# Patient Record
Sex: Male | Born: 1965 | Race: White | Hispanic: No | Marital: Married | Smoking: Never smoker
Health system: Southern US, Community
[De-identification: ages and names within clinical notes are randomized; demographics above are authoritative.]

## PROBLEM LIST (undated history)

## (undated) ENCOUNTER — Emergency Department: Discharge status: Absent without leave | Payer: Self-pay

## (undated) DIAGNOSIS — G4733 Obstructive sleep apnea (adult) (pediatric): Secondary | ICD-10-CM

## (undated) DIAGNOSIS — T4145XA Adverse effect of unspecified anesthetic, initial encounter: Secondary | ICD-10-CM

## (undated) DIAGNOSIS — K219 Gastro-esophageal reflux disease without esophagitis: Secondary | ICD-10-CM

## (undated) DIAGNOSIS — E781 Pure hyperglyceridemia: Secondary | ICD-10-CM

## (undated) DIAGNOSIS — K5792 Diverticulitis of intestine, part unspecified, without perforation or abscess without bleeding: Secondary | ICD-10-CM

## (undated) DIAGNOSIS — Z87442 Personal history of urinary calculi: Secondary | ICD-10-CM

## (undated) DIAGNOSIS — IMO0001 Reserved for inherently not codable concepts without codable children: Secondary | ICD-10-CM

## (undated) DIAGNOSIS — Z8719 Personal history of other diseases of the digestive system: Secondary | ICD-10-CM

## (undated) DIAGNOSIS — K200 Eosinophilic esophagitis: Secondary | ICD-10-CM

## (undated) DIAGNOSIS — E119 Type 2 diabetes mellitus without complications: Secondary | ICD-10-CM

## (undated) DIAGNOSIS — E786 Lipoprotein deficiency: Secondary | ICD-10-CM

## (undated) DIAGNOSIS — M5030 Other cervical disc degeneration, unspecified cervical region: Secondary | ICD-10-CM

## (undated) DIAGNOSIS — T8859XA Other complications of anesthesia, initial encounter: Secondary | ICD-10-CM

## (undated) DIAGNOSIS — J189 Pneumonia, unspecified organism: Secondary | ICD-10-CM

## (undated) DIAGNOSIS — G43909 Migraine, unspecified, not intractable, without status migrainosus: Secondary | ICD-10-CM

## (undated) HISTORY — DX: Diverticulitis of intestine, part unspecified, without perforation or abscess without bleeding: K57.92

## (undated) HISTORY — DX: Migraine, unspecified, not intractable, without status migrainosus: G43.909

## (undated) HISTORY — DX: Pure hyperglyceridemia: E78.1

## (undated) HISTORY — PX: NASAL SINUS SURGERY: SHX719

## (undated) HISTORY — DX: Other cervical disc degeneration, unspecified cervical region: M50.30

## (undated) HISTORY — PX: TYMPANOSTOMY TUBE PLACEMENT: SHX32

## (undated) HISTORY — DX: Eosinophilic esophagitis: K20.0

## (undated) HISTORY — DX: Obstructive sleep apnea (adult) (pediatric): G47.33

## (undated) HISTORY — DX: Reserved for inherently not codable concepts without codable children: IMO0001

## (undated) HISTORY — DX: Lipoprotein deficiency: E78.6

## (undated) HISTORY — PX: KIDNEY STONE SURGERY: SHX686

## (undated) HISTORY — PX: TONSILLECTOMY AND ADENOIDECTOMY: SHX28

---

## 2004-11-03 ENCOUNTER — Ambulatory Visit: Payer: Self-pay | Admitting: Family Medicine

## 2004-11-28 ENCOUNTER — Ambulatory Visit: Payer: Self-pay | Admitting: Family Medicine

## 2004-12-25 ENCOUNTER — Ambulatory Visit: Payer: Self-pay | Admitting: Family Medicine

## 2005-01-15 ENCOUNTER — Ambulatory Visit: Payer: Self-pay | Admitting: Family Medicine

## 2005-02-21 ENCOUNTER — Ambulatory Visit: Payer: Self-pay | Admitting: Family Medicine

## 2005-04-04 ENCOUNTER — Ambulatory Visit: Payer: Self-pay | Admitting: Family Medicine

## 2005-05-17 ENCOUNTER — Ambulatory Visit: Payer: Self-pay | Admitting: Family Medicine

## 2005-08-16 ENCOUNTER — Ambulatory Visit: Payer: Self-pay | Admitting: Family Medicine

## 2005-08-23 ENCOUNTER — Ambulatory Visit: Payer: Self-pay | Admitting: Family Medicine

## 2005-11-20 DIAGNOSIS — N200 Calculus of kidney: Secondary | ICD-10-CM | POA: Insufficient documentation

## 2005-11-20 DIAGNOSIS — K2100 Gastro-esophageal reflux disease with esophagitis, without bleeding: Secondary | ICD-10-CM | POA: Insufficient documentation

## 2005-11-20 DIAGNOSIS — G43909 Migraine, unspecified, not intractable, without status migrainosus: Secondary | ICD-10-CM | POA: Insufficient documentation

## 2005-11-20 DIAGNOSIS — F411 Generalized anxiety disorder: Secondary | ICD-10-CM | POA: Insufficient documentation

## 2005-11-20 DIAGNOSIS — K209 Esophagitis, unspecified: Secondary | ICD-10-CM

## 2006-11-20 ENCOUNTER — Ambulatory Visit: Payer: Self-pay | Admitting: Family Medicine

## 2006-12-05 ENCOUNTER — Ambulatory Visit: Payer: Self-pay | Admitting: Family Medicine

## 2006-12-11 ENCOUNTER — Encounter: Admission: RE | Admit: 2006-12-11 | Discharge: 2006-12-11 | Payer: Self-pay | Admitting: Family Medicine

## 2006-12-11 ENCOUNTER — Ambulatory Visit: Payer: Self-pay | Admitting: Family Medicine

## 2006-12-12 ENCOUNTER — Encounter: Payer: Self-pay | Admitting: Family Medicine

## 2006-12-13 ENCOUNTER — Telehealth (INDEPENDENT_AMBULATORY_CARE_PROVIDER_SITE_OTHER): Payer: Self-pay | Admitting: *Deleted

## 2006-12-25 ENCOUNTER — Encounter: Payer: Self-pay | Admitting: Family Medicine

## 2007-01-24 ENCOUNTER — Encounter: Payer: Self-pay | Admitting: Family Medicine

## 2007-02-08 ENCOUNTER — Encounter: Payer: Self-pay | Admitting: Family Medicine

## 2007-03-03 ENCOUNTER — Encounter: Payer: Self-pay | Admitting: Family Medicine

## 2007-06-25 ENCOUNTER — Encounter: Payer: Self-pay | Admitting: Family Medicine

## 2007-06-25 DIAGNOSIS — K5730 Diverticulosis of large intestine without perforation or abscess without bleeding: Secondary | ICD-10-CM | POA: Insufficient documentation

## 2007-06-25 DIAGNOSIS — K760 Fatty (change of) liver, not elsewhere classified: Secondary | ICD-10-CM | POA: Insufficient documentation

## 2007-07-01 ENCOUNTER — Ambulatory Visit: Payer: Self-pay | Admitting: Family Medicine

## 2007-07-01 DIAGNOSIS — B070 Plantar wart: Secondary | ICD-10-CM | POA: Insufficient documentation

## 2007-07-02 ENCOUNTER — Encounter: Payer: Self-pay | Admitting: Family Medicine

## 2007-07-02 LAB — CONVERTED CEMR LAB
AST: 28 units/L (ref 0–37)
INR: 1 (ref 0.0–1.5)
Prothrombin Time: 13.5 s (ref 11.6–15.2)

## 2007-07-08 ENCOUNTER — Encounter: Payer: Self-pay | Admitting: Family Medicine

## 2007-07-22 ENCOUNTER — Encounter: Payer: Self-pay | Admitting: Family Medicine

## 2007-11-24 ENCOUNTER — Ambulatory Visit: Payer: Self-pay | Admitting: Family Medicine

## 2007-11-24 ENCOUNTER — Encounter: Admission: RE | Admit: 2007-11-24 | Discharge: 2007-11-24 | Payer: Self-pay | Admitting: Family Medicine

## 2007-11-24 DIAGNOSIS — M546 Pain in thoracic spine: Secondary | ICD-10-CM | POA: Insufficient documentation

## 2008-01-26 ENCOUNTER — Ambulatory Visit: Payer: Self-pay | Admitting: Family Medicine

## 2008-01-29 ENCOUNTER — Encounter: Payer: Self-pay | Admitting: Family Medicine

## 2008-02-04 ENCOUNTER — Encounter: Admission: RE | Admit: 2008-02-04 | Discharge: 2008-02-12 | Payer: Self-pay | Admitting: Sports Medicine

## 2008-02-14 ENCOUNTER — Ambulatory Visit: Payer: Self-pay | Admitting: Occupational Medicine

## 2008-02-14 DIAGNOSIS — J018 Other acute sinusitis: Secondary | ICD-10-CM | POA: Insufficient documentation

## 2008-08-18 ENCOUNTER — Ambulatory Visit: Payer: Self-pay | Admitting: Family Medicine

## 2008-08-18 DIAGNOSIS — H6980 Other specified disorders of Eustachian tube, unspecified ear: Secondary | ICD-10-CM | POA: Insufficient documentation

## 2008-08-18 DIAGNOSIS — H6690 Otitis media, unspecified, unspecified ear: Secondary | ICD-10-CM | POA: Insufficient documentation

## 2008-10-20 ENCOUNTER — Ambulatory Visit: Payer: Self-pay | Admitting: Family Medicine

## 2008-10-20 DIAGNOSIS — B852 Pediculosis, unspecified: Secondary | ICD-10-CM | POA: Insufficient documentation

## 2008-10-20 DIAGNOSIS — J019 Acute sinusitis, unspecified: Secondary | ICD-10-CM | POA: Insufficient documentation

## 2008-10-22 ENCOUNTER — Encounter: Payer: Self-pay | Admitting: Family Medicine

## 2008-10-22 LAB — CONVERTED CEMR LAB
Eosinophils Absolute: 0.3 10*3/uL (ref 0.0–0.7)
Eosinophils Relative: 3 % (ref 0–5)
Lymphocytes Relative: 19 % (ref 12–46)
MCHC: 33.3 g/dL (ref 30.0–36.0)
MCV: 84.2 fL (ref 78.0–100.0)
Monocytes Absolute: 0.8 10*3/uL (ref 0.1–1.0)
Monocytes Relative: 8 % (ref 3–12)
Platelets: 179 10*3/uL (ref 150–400)
RDW: 14.7 % (ref 11.5–15.5)

## 2009-03-24 ENCOUNTER — Encounter: Payer: Self-pay | Admitting: Family Medicine

## 2009-06-26 ENCOUNTER — Encounter: Payer: Self-pay | Admitting: Family Medicine

## 2009-11-16 ENCOUNTER — Ambulatory Visit: Payer: Self-pay | Admitting: Family Medicine

## 2009-11-16 DIAGNOSIS — G43119 Migraine with aura, intractable, without status migrainosus: Secondary | ICD-10-CM | POA: Insufficient documentation

## 2009-11-16 DIAGNOSIS — I10 Essential (primary) hypertension: Secondary | ICD-10-CM | POA: Insufficient documentation

## 2009-11-16 DIAGNOSIS — G4733 Obstructive sleep apnea (adult) (pediatric): Secondary | ICD-10-CM | POA: Insufficient documentation

## 2010-03-14 NOTE — Consult Note (Signed)
Summary: [facility]   Imported By: Lanelle Bal 04/05/2009 13:08:04  _____________________________________________________________________  External Attachment:    Type:   Image     Comment:   External Document

## 2010-03-14 NOTE — Assessment & Plan Note (Signed)
Summary: chronic daily HA   Vital Signs:  Patient profile:   45 year old male Height:      71.5 inches Weight:      237 pounds BMI:     32.71 O2 Sat:      97 % on Room air Temp:     98.5 degrees F oral Pulse rate:   78 / minute BP sitting:   157 / 103  (left arm) Cuff size:   large  Vitals Entered By: Payton Spark CMA (November 16, 2009 10:06 AM)  O2 Flow:  Room air CC: Daily HAs- getting worse. Also c/o knot on top of head x 3 months- no known injury.   Primary Care Provider:  Seymour Bars DO  CC:  Daily HAs- getting worse. Also c/o knot on top of head x 3 months- no known injury.Marland Kitchen  History of Present Illness: 45 yo WM presents for chronic daily HA x 3 wks with hx of chronic migraines.  His pain is over the L temple and throbbing.  He has a  'knot' under the skin over the L side of his head.  Not sure if this is causing more pain.  His friend has brain cancer.  Prior to this , he was getting migraines 1 x a wk.  They would usually improve with taking NSAIDs.  He has been taking NSAIDs everyday x 3 wks.  Denies lightsensitivity, nausea, vomitting or vision changes.  He did well on Topomax in the past but this caused kidney stones.  Was on Inderall for a while but admits to poor compliance simply because he hates taking meds.  He went for a sleep study (self - referred ) 2 mos ago at [facility] and tested + for OSA.  Was fitted and titrated on CPAP but has been ripping it off at 2am every night.    Current Medications (verified): 1)  None  Allergies (verified): 1)  ! Niacin  Past History:  Past Medical History: DDD of c-spine  eosinophilic esophagitis- hx of ([facility]) high triglycerides  low HDL Cr 1.8 with kidney stone 11-07 (uro Dr Azucena Kuba) low testosterone -- needs f/u diverticulitis OSA, sleep study at [facility] 09-2009 migraines  Past Surgical History: Reviewed history from 01/08/2006 and no changes required. EGD- eosinophilic esophagitis; repeat EGD in 2007 normal  MRI  head and neck- normal  MRI tspine- thoracic scoliosis  sinus surgery  stress echo- EF 60-65%, normal  Social History: Reviewed history from 10/20/2008 and no changes required. Married to [place], has 2 children.  Non-smoker.  Occasional ETOH. 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  Denies recreational drug use.  Review of Systems General:  Complains of fatigue; denies chills and fever. Eyes:  Denies blurring, double vision, eye pain, and light sensitivity. ENT:  Denies difficulty swallowing. CV:  Denies chest pain or discomfort, shortness of breath with exertion, and swelling of feet. Neuro:  Complains of headaches; denies difficulty with concentration, disturbances in coordination, memory loss, numbness, tingling, and tremors.  Physical Exam  General:  alert, well-developed, well-nourished, and well-hydrated.  obese Head:  normocephalic and atraumatic.  tiny moveable subdermal cyst  ~1cm over the L glabella, at hair line Eyes:  pupils equal, pupils round, and pupils reactive to light.   Nose:  no nasal discharge.   Mouth:  pharynx pink and moist.   Neck:  no masses.   Lungs:  Normal respiratory effort, chest expands symmetrically. Lungs are clear to auscultation, no crackles or wheezes.  Heart:  normal rate, regular rhythm, and no murmur.   Extremities:  no LE edema Skin:  color normal.   Cervical Nodes:  No lymphadenopathy noted Psych:  flat affect.     Impression & Recommendations:  Problem # 1:  MIGRAINE, CLASSICAL, INTRACTABLE (ICD-346.01) Will treat chronic daily HA x 3 wks with Prednisone taper 80-60-40-20-10 stop.  Avoid NSAIDS.  Has a few Oxycodone from the past for severe pain or can use plain tylenol for additional pain. Will start him on Desipramine at night for migraine prevention.  High stress level and untreated sleep apnea and HIGH BP are contributing factors. He is reluctant to take meds on a regular basis.   F/U in 6 wks. The following medications were  removed from the medication list:    Percocet 5-325 Mg Tabs (Oxycodone-acetaminophen) ..... By mouth q6-8hrs prn  Problem # 2:  ESSENTIAL HYPERTENSION, BENIGN (ICD-401.1) BP has remained high with may be leading to his HAs. Will treat with Enalapril daily.  Will need to work on stress reduction, healthy diet and regular exercise. RTC in 6 wks and will check fasting labs. His updated medication list for this problem includes:    Enalapril Maleate 10 Mg Tabs (Enalapril maleate) .Marland Kitchen... 1 tab by mouth daily  BP today: 157/103 Prior BP: 153/99 (10/20/2008)  Problem # 3:  OBSTRUCTIVE SLEEP APNEA (ICD-327.23) Will get sleep study notes from [facility] Encouraged compliance with CPAP.  Adding nightly desipramine may also help his tolerability with CPAP device. Hopefully,  treated his OSA will help with energy level, BP, wt loss and migraines.  Complete Medication List: 1)  Enalapril Maleate 10 Mg Tabs (Enalapril maleate) .Marland Kitchen.. 1 tab by mouth daily 2)  Desipramine Hcl 25 Mg Tabs (Desipramine hcl) .Marland Kitchen.. 1 tab by mouth qhs 3)  Prednisone 20 Mg Tabs (Prednisone) .... 4 tabs by mouth x 1 day then 3 tab by mouth once daily x 1 day then 2 tabs by mouth once daily x 1 day then 1 tab by mouth once daily x 1 day then 1/2 tab by mouth x1  Patient Instructions: 1)  Start Prednisone taper today for chronic daily headache - 80-60-40-20-10 then stop.  Be sure to complete this. 2)  Start Enalapril once daily for high BP.  Take in the morning. 3)  Start Despiramine at bedtime for migraine prevention.  This should also help you to sleep with your CPAP on. 4)  REturn for f/u migraines/ BP/ Labs in 6 wks. Prescriptions: PREDNISONE 20 MG TABS (PREDNISONE) 4 tabs by mouth x 1 day then 3 tab by mouth once daily x 1 day then 2 tabs by mouth once daily x 1 day then 1 tab by mouth once daily x 1 day then 1/2 tab by mouth x1  #11 tabs x 0   Entered and Authorized by:   Seymour Bars DO   Signed by:   Seymour Bars DO on 11/16/2009    Method used:   Printed then faxed to ...       [facility]* (retail)       [address] [place], [place]  [address]       Ph: [phone]       Fax: [phone]   RxID:   [id number] DESIPRAMINE HCL 25 MG TABS (DESIPRAMINE HCL) 1 tab by mouth qhs  #30 x 2   Entered and Authorized by:   Seymour Bars DO   Signed by:  Seymour Bars DO on 11/16/2009   Method used:   Electronically to        [facility]* (retail)       [address] [place], [place]  [address]       Ph: [phone]       Fax: [phone]   RxID:   [id number] ENALAPRIL MALEATE 10 MG TABS (ENALAPRIL MALEATE) 1 tab by mouth daily  #30 x 2   Entered and Authorized by:   Seymour Bars DO   Signed by:   Seymour Bars DO on 11/16/2009   Method used:   Electronically to        [facility]* (retail)       [address] [place], [place]  [address]       Ph: [phone]       Fax: [phone]   RxID:   [id number]

## 2010-03-15 ENCOUNTER — Encounter: Payer: Self-pay | Admitting: Family Medicine

## 2010-04-05 ENCOUNTER — Encounter: Payer: Self-pay | Admitting: Family Medicine

## 2010-04-11 NOTE — Letter (Signed)
Summary: [facility]  [facility]   Imported By: Lanelle Bal 04/06/2010 10:35:57  _____________________________________________________________________  External Attachment:    Type:   Image     Comment:   External Document

## 2010-04-13 ENCOUNTER — Encounter: Payer: Self-pay | Admitting: Family Medicine

## 2010-04-20 NOTE — Letter (Signed)
Summary: [facility]  [facility]   Imported By: Lanelle Bal 04/13/2010 12:47:33  _____________________________________________________________________  External Attachment:    Type:   Image     Comment:   External Document

## 2010-05-02 NOTE — Letter (Signed)
Summary: [facility]   Imported By: Maryln Gottron 04/25/2010 09:37:22  _____________________________________________________________________  External Attachment:    Type:   Image     Comment:   External Document

## 2010-09-18 ENCOUNTER — Encounter: Payer: Self-pay | Admitting: Family Medicine

## 2010-09-18 ENCOUNTER — Ambulatory Visit (INDEPENDENT_AMBULATORY_CARE_PROVIDER_SITE_OTHER): Payer: BC Managed Care – PPO | Admitting: Family Medicine

## 2010-09-18 VITALS — BP 156/100 | HR 73 | Ht 71.0 in | Wt 235.0 lb

## 2010-09-18 DIAGNOSIS — L678 Other hair color and hair shaft abnormalities: Secondary | ICD-10-CM

## 2010-09-18 DIAGNOSIS — L739 Follicular disorder, unspecified: Secondary | ICD-10-CM

## 2010-09-18 DIAGNOSIS — L738 Other specified follicular disorders: Secondary | ICD-10-CM

## 2010-09-18 MED ORDER — CLINDAMYCIN PHOSPHATE 1 % EX GEL: CUTANEOUS | Status: DC

## 2010-09-18 NOTE — Progress Notes (Signed)
  Subjective:    Patient ID: Nicholas Christian, male    DOB: Mar 06, 1965, 45 y.o.   MRN: [id number]  HPI Rash for one month on inner thigh.  Feels it is worse.  Has tried cortisone, desitin, tinactin, lotrim (1 week) and not improvement. He was in a hot tube about a month ago. Has had similar rash rash but not quite like this. It has been more tender and occ itchy.     Review of Systems     Objective:   Physical Exam Inner thigh creases with fine papular lesion that are over the hair follicles. No vesicles or drainage.         Assessment & Plan:  Folliculitis - Will try to tx topically with clindamycin. If not better in one week then call and will treat with oral ABX. Consider could be fungal folliculitis as well.

## 2010-09-18 NOTE — Patient Instructions (Signed)
Call if not better in one week, if not better will try an oral antibiotic.

## 2010-09-21 ENCOUNTER — Telehealth: Payer: Self-pay | Admitting: *Deleted

## 2010-09-21 MED ORDER — FLUCONAZOLE 150 MG PO TABS: 150.0000 mg | ORAL_TABLET | Freq: Once | ORAL | Status: AC

## 2010-09-21 MED ORDER — SULFAMETHOXAZOLE-TMP DS 800-160 MG PO TABS: 1.0000 | ORAL_TABLET | Freq: Two times a day (BID) | ORAL | Status: AC

## 2010-09-21 NOTE — Telephone Encounter (Signed)
Pt notified of MD instruction and that med sent. KJ LPN

## 2010-09-21 NOTE — Telephone Encounter (Signed)
Nicholas Christian to medications. One is an antifungal and antibiotic. He can take them at the same time.

## 2010-09-21 NOTE — Telephone Encounter (Signed)
Pt called and said he was seen on Monday and was given topical antibiotic, and he was told to call if there was not an improvement. Pt uses [facility] in [place]. SHT/LPN

## 2010-10-04 ENCOUNTER — Telehealth: Payer: Self-pay | Admitting: Family Medicine

## 2010-10-04 MED ORDER — FLUCONAZOLE 150 MG PO TABS: 150.0000 mg | ORAL_TABLET | Freq: Once | ORAL | Status: AC

## 2010-10-04 NOTE — Telephone Encounter (Signed)
Pt called in on 10-03-10, and stated that he had been given two medications a fungal and bacterial medication for his rash.  [place] of [place] ocean and [place] and he felt intially he had made a 75% improvement, but now the rash has returned but worse.  Has not finished taking the bacterial antibiotic yet.  Please advise today if possible.  Pt calling back a second day in a row since no reply yesterday.  Tried to review in basket and telephone encounters but don't see where this mess was addressed yet.  Dr. Linford Arnold I do not see where you even have in your open encounters or in basket.   Routed to Dr. Marlyne Beards, LPN Domingo Dimes

## 2010-10-04 NOTE — Telephone Encounter (Signed)
I didn't get the message on him yesterday. Certainly since he is completing antibiotic we did repeat the fungal treatment. I put in the medication but I didn't send it to the pharmacy as I didn't know if he wanted it sent to the beach.

## 2010-10-04 NOTE — Telephone Encounter (Addendum)
Pt would like the med sent to the beach according to our conversation yesterday.  The pharmacy is loaded into the system already.  Pt's wife informed to check with the pharm later on today. Routed to Dr. Marlyne Beards, LPN Domingo Dimes

## 2011-01-25 ENCOUNTER — Encounter: Payer: Self-pay | Admitting: Family Medicine

## 2011-01-29 ENCOUNTER — Encounter: Payer: Self-pay | Admitting: Family Medicine

## 2011-01-29 ENCOUNTER — Ambulatory Visit (INDEPENDENT_AMBULATORY_CARE_PROVIDER_SITE_OTHER): Payer: BC Managed Care – PPO | Admitting: Family Medicine

## 2011-01-29 DIAGNOSIS — I10 Essential (primary) hypertension: Secondary | ICD-10-CM

## 2011-01-29 DIAGNOSIS — Z0000 Encounter for general adult medical examination without abnormal findings: Secondary | ICD-10-CM

## 2011-01-29 DIAGNOSIS — Z23 Encounter for immunization: Secondary | ICD-10-CM

## 2011-01-29 DIAGNOSIS — K200 Eosinophilic esophagitis: Secondary | ICD-10-CM

## 2011-01-29 LAB — COMPLETE METABOLIC PANEL WITH GFR
ALT: 49 U/L (ref 0–53)
AST: 35 U/L (ref 0–37)
Alkaline Phosphatase: 81 U/L (ref 39–117)
BUN: 12 mg/dL (ref 6–23)
CO2: 29 mEq/L (ref 19–32)
Chloride: 103 mEq/L (ref 96–112)
Creat: 1.12 mg/dL (ref 0.50–1.35)
Glucose, Bld: 186 mg/dL — ABNORMAL HIGH (ref 70–99)
Total Protein: 6.7 g/dL (ref 6.0–8.3)

## 2011-01-29 LAB — PSA: PSA: 1.44 ng/mL (ref <=4.00)

## 2011-01-29 LAB — LIPID PANEL
Cholesterol: 177 mg/dL (ref 0–200)
Total CHOL/HDL Ratio: 5.2 Ratio
VLDL: 46 mg/dL — ABNORMAL HIGH (ref 0–40)

## 2011-01-29 MED ORDER — LISINOPRIL 20 MG PO TABS: 20.0000 mg | ORAL_TABLET | Freq: Every day | ORAL | Status: DC

## 2011-01-29 NOTE — Patient Instructions (Signed)
Start a regular exercise program and make sure you are eating a healthy diet Try to eat 4 servings of dairy a day or take a calcium supplement (500mg twice a day). Your vaccines are up to date.  We will call you with your lab results. If you don't here from us in about a week then please give us a call at [phone].  

## 2011-01-29 NOTE — Progress Notes (Signed)
Subjective:    Patient ID: Nicholas Christian, male    DOB: [birth year], 45 y.o.   MRN: [id number]  HPI Here for CPE. Doing well oveall. Was recently seen in the ED fo [facility]. Ruled out for cardiac dz. Likdly costochondritis.  BP was 180 there. They did an xray and EKG that were normal. No premature fam hx of heart dz.    Review of Systems Comprehensive ROS is negative.     BP 154/105  Pulse 81  Wt 245 lb (111.131 kg)    Allergies  Allergen Reactions  . Niacin     Past Medical History  Diagnosis Date  . DDD (degenerative disc disease), cervical   . Eosinophilic esophagitis     hx of- [facility]  . High triglycerides   . Low HDL (under 40)   . Diverticulitis   . Low testosterone   . OSA (obstructive sleep apnea)   . Migraines   . Normal cardiac stress test     DOBUTAMINE STRESS ECHO    Past Surgical History  Procedure Date  . Nasal sinus surgery     Removal polyps   . Kidney stone surgery     laser at [facility].   . Tonsillectomy and adenoidectomy   . Tympanostomy tube placement     History   Social History  . Marital Status: Married    Spouse Name: N/A    Number of Children: 3  . Years of Education: N/A   Occupational History  . Self employed      Hydrographic surveyor.    Social History Main Topics  . Smoking status: Never Smoker   . Smokeless tobacco: Not on file  . Alcohol Use: No  . Drug Use: No  . Sexually Active: Yes -- Male partner(s)   Other Topics Concern  . Not on file   Social History Narrative   No regular exercise. 1 caffeine /soda per day.     Family History  Problem Relation Age of Onset  . Diabetes Maternal Grandmother   . Prostate cancer Father     oldr when dx      Objective:   Physical Exam  Constitutional: He is oriented to person, place, and time. He appears well-developed and well-nourished.  HENT:  Head: Normocephalic and atraumatic.  Right Ear: External ear normal.  Left Ear: External ear normal.  Nose:  Nose normal.  Mouth/Throat: Oropharynx is clear and moist.  Eyes: Conjunctivae and EOM are normal. Pupils are equal, round, and reactive to light.  Neck: Normal range of motion. Neck supple. No thyromegaly present.  Cardiovascular: Normal rate, regular rhythm, normal heart sounds and intact distal pulses.   Pulmonary/Chest: Effort normal and breath sounds normal.       Chest wall is mildly tender on the left near the sternocostal border.   Abdominal: Soft. Bowel sounds are normal. He exhibits no distension and no mass. There is no tenderness. There is no rebound and no guarding.  Musculoskeletal: Normal range of motion.  Lymphadenopathy:    He has no cervical adenopathy.  Neurological: He is alert and oriented to person, place, and time. He has normal reflexes.  Skin: Skin is warm and dry.  Psychiatric: He has a normal mood and affect. His behavior is normal. Judgment and thought content normal.          Assessment & Plan:  CPE Start a regular exercise program and make sure you are eating a healthy diet Try to eat  4 servings of dairy a day or take a calcium supplement (500mg  twice a day). Your vaccines are up to date.  Lab slip given. He would like PSA checkec.  Flu shot  HTN - new dx. Will start lisinopril. Warned of SE. F/U in 6-8 weeks.  Check BMP at that time. Discussed low salt diet.   Atypical Chest Pain - Better but not resolved. Wil get records from the ED.   Needs referral for eosinophilic esophagityis. Was seen at [facility] but says he saw a different doc every time. They no longer come to [place] so would like to estab elsewhere says he thinks he is due for repeat endo.

## 2011-02-07 ENCOUNTER — Ambulatory Visit (INDEPENDENT_AMBULATORY_CARE_PROVIDER_SITE_OTHER): Payer: BC Managed Care – PPO | Admitting: Family Medicine

## 2011-02-07 VITALS — BP 140/87 | HR 75

## 2011-02-07 DIAGNOSIS — R739 Hyperglycemia, unspecified: Secondary | ICD-10-CM

## 2011-02-07 DIAGNOSIS — R7309 Other abnormal glucose: Secondary | ICD-10-CM

## 2011-02-07 DIAGNOSIS — E119 Type 2 diabetes mellitus without complications: Secondary | ICD-10-CM | POA: Insufficient documentation

## 2011-02-07 MED ORDER — METFORMIN HCL 500 MG PO TABS: 500.0000 mg | ORAL_TABLET | Freq: Two times a day (BID) | ORAL | Status: DC

## 2011-02-07 NOTE — Progress Notes (Signed)
Patient ID: Nicholas Christian, male   DOB: 1965-12-25, 45 y.o.   MRN: [id number] A1C needed to f/u on elevated sugar     DM- Based on A1C he definitelyh has diabetes. Will need to make appt with me to discuss his new dx.  In the meantime want him to start metformin 500mg  bid and work on cutting sugar out of his diet. When he comes in can discuss getting him a machine, etc.

## 2011-02-16 ENCOUNTER — Encounter: Payer: Self-pay | Admitting: Family Medicine

## 2011-02-16 ENCOUNTER — Ambulatory Visit (INDEPENDENT_AMBULATORY_CARE_PROVIDER_SITE_OTHER): Payer: BC Managed Care – PPO | Admitting: Family Medicine

## 2011-02-16 VITALS — BP 130/82 | HR 112 | Wt 241.0 lb

## 2011-02-16 DIAGNOSIS — E669 Obesity, unspecified: Secondary | ICD-10-CM | POA: Insufficient documentation

## 2011-02-16 DIAGNOSIS — Z23 Encounter for immunization: Secondary | ICD-10-CM

## 2011-02-16 DIAGNOSIS — E119 Type 2 diabetes mellitus without complications: Secondary | ICD-10-CM

## 2011-02-16 LAB — POCT UA - MICROALBUMIN

## 2011-02-16 MED ORDER — AMBULATORY NON FORMULARY MEDICATION: Status: DC

## 2011-02-16 NOTE — Progress Notes (Signed)
  Subjective:    Patient ID: Nicholas Christian, male    DOB: 1965/06/03, 46 y.o.   MRN: [id number]  HPI Here to discuss new dx of DM. Since last week he has stopped soda, bread, etc. Has gained 50 lbs in about 5 years.  Sress levels is really high.  Really thinks he can make some major changes to his diet and did the diabetes under control. He also has a history of fatty liver disease. He does not exercise regularly. 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 He says his wife is very gone out and bought some new cookbooks since she is the primary cook in the household. He does tend to eat a lot of fast food but he is really try to make a change in in the last week. He said he has already lost  5 pounds. Lab Results  Component Value Date   HGBA1C 8.0 02/07/2011      Review of Systems     Objective:   Physical Exam  Constitutional: He appears well-developed and well-nourished.  Psychiatric: He has a normal mood and affect. His behavior is normal.          Assessment & Plan:  Diabetes-new diagnosis. We discussed getting him in for nutrition counseling. We'll make a referral. I also gave him prescriptions today to get a glucometer with testing strips. I would like him to start testing 3 times per week before breakfast. He has an endoscopy scheduled sometime next week and GI office had requested that he hold off on starting the metformin until he's procedure is complete. I think that is fine we can certainly hold the metformin for another week or 2.  Fortunately his blood pressure is well controlled. We will need to get a urine microalbumin at his next visit. I would like to try his sugars down before we check this. Monofilament exam was performed today. We discussed the importance of dietary changes and weight loss limit the big impact on his diabetes and his fatty liver disease. Encouraged him to make an appointment for yearly eye  exam. Lab Results  Component Value Date   HGBA1C 8.0 02/07/2011   Lab Results  Component Value Date   LDLCALC 97 01/29/2011   CREATININE 1.12 01/29/2011

## 2011-04-06 ENCOUNTER — Encounter: Payer: Self-pay | Admitting: *Deleted

## 2011-04-08 ENCOUNTER — Other Ambulatory Visit: Payer: Self-pay | Admitting: Family Medicine

## 2011-04-10 ENCOUNTER — Encounter: Payer: Self-pay | Admitting: Family Medicine

## 2011-04-10 ENCOUNTER — Ambulatory Visit (INDEPENDENT_AMBULATORY_CARE_PROVIDER_SITE_OTHER): Payer: BC Managed Care – PPO | Admitting: Family Medicine

## 2011-04-10 VITALS — BP 127/84 | HR 65 | Ht 71.5 in | Wt 224.0 lb

## 2011-04-10 DIAGNOSIS — E119 Type 2 diabetes mellitus without complications: Secondary | ICD-10-CM

## 2011-04-10 MED ORDER — AMBULATORY NON FORMULARY MEDICATION: Status: DC

## 2011-04-10 MED ORDER — LISINOPRIL 20 MG PO TABS: 20.0000 mg | ORAL_TABLET | Freq: Every day | ORAL | Status: DC

## 2011-04-10 NOTE — Patient Instructions (Signed)
Decrease your testing twice a week.

## 2011-04-10 NOTE — Progress Notes (Signed)
  Subjective:    Patient ID: Nicholas Christian, male    DOB: [birth year], 46 y.o.   MRN: [id number]  HPI DM- 14 day average is 98 and 30 days ave is 100. Did the nutrition class.  He is doing the nutri-system.  Has lost 17 lbs. Taking metformin. No SE.  Lowest sugar is 83. No hypoglycemic events.  Working out some but not Gaffer.    Review of Systems     Objective:   Physical Exam  Constitutional: He is oriented to person, place, and time. He appears well-developed and well-nourished.  HENT:  Head: Normocephalic and atraumatic.  Cardiovascular: Normal rate, regular rhythm and normal heart sounds.   Pulmonary/Chest: Effort normal and breath sounds normal.  Neurological: He is alert and oriented to person, place, and time.  Skin: Skin is warm and dry.  Psychiatric: He has a normal mood and affect. His behavior is normal.          Assessment & Plan:  DM- Doing well. Home sugars look fantastic. Can dec testing form daily to 2/week.  F/u in 6 weeks for next A1C.  Had eye exam.  Due for urine microalbumin.  Keep up the goodwork. Congratulations on weight loss. Make sure getting regular exercise.

## 2011-04-11 LAB — POCT UA - MICROALBUMIN

## 2011-04-11 NOTE — Progress Notes (Signed)
Addended by: Wyline Beady on: 04/11/2011 02:08 PM   Modules accepted: Orders

## 2011-05-08 ENCOUNTER — Ambulatory Visit (INDEPENDENT_AMBULATORY_CARE_PROVIDER_SITE_OTHER): Payer: BC Managed Care – PPO | Admitting: Family Medicine

## 2011-05-08 ENCOUNTER — Encounter: Payer: Self-pay | Admitting: Family Medicine

## 2011-05-08 VITALS — BP 117/81 | HR 100 | Ht 69.0 in | Wt 215.0 lb

## 2011-05-08 DIAGNOSIS — E119 Type 2 diabetes mellitus without complications: Secondary | ICD-10-CM

## 2011-05-08 NOTE — Progress Notes (Signed)
  Subjective:    Patient ID: Nicholas Christian, male    DOB: 05/05/1965, 46 y.o.   MRN: [id number]  Diabetes He presents for his follow-up diabetic visit. He has type 2 diabetes mellitus. His disease course has been stable. Pertinent negatives for diabetes include no blurred vision, no foot ulcerations, no polydipsia, no polyphagia, no polyuria and no visual change. Symptoms are stable. Current diabetic treatment includes oral agent (monotherapy). He is compliant with treatment all of the time. He has not had a previous visit with a dietician. He participates in exercise three times a week. His breakfast blood glucose range is generally 90-110 mg/dl. An ACE inhibitor/angiotensin II receptor blocker is being taken.      Review of Systems  Eyes: Negative for blurred vision.  Genitourinary: Negative for polyuria.  Hematological: Negative for polydipsia and polyphagia.       Objective:   Physical Exam  Constitutional: He is oriented to person, place, and time. He appears well-developed and well-nourished.  HENT:  Head: Normocephalic and atraumatic.  Cardiovascular: Normal rate, regular rhythm and normal heart sounds.   Pulmonary/Chest: Effort normal and breath sounds normal.  Neurological: He is alert and oriented to person, place, and time.  Skin: Skin is warm and dry.  Psychiatric: He has a normal mood and affect. His behavior is normal.          Assessment & Plan:  DM- A1C looks great!!  Keep up the exercise and keep up the good work. Dec metformin to once a day. F/u in 3-4 months. If stil well controlled then can d/c metformin.   He wants to know if ok for tatoo.  Since well controlled. OK to go.

## 2011-07-18 ENCOUNTER — Encounter: Payer: Self-pay | Admitting: Family Medicine

## 2011-07-18 DIAGNOSIS — K200 Eosinophilic esophagitis: Secondary | ICD-10-CM | POA: Insufficient documentation

## 2011-07-18 HISTORY — PX: ESOPHAGEAL DILATION: SHX303

## 2011-08-02 ENCOUNTER — Ambulatory Visit (INDEPENDENT_AMBULATORY_CARE_PROVIDER_SITE_OTHER): Payer: BC Managed Care – PPO | Admitting: Family Medicine

## 2011-08-02 DIAGNOSIS — Z23 Encounter for immunization: Secondary | ICD-10-CM

## 2011-08-03 ENCOUNTER — Emergency Department (HOSPITAL_BASED_OUTPATIENT_CLINIC_OR_DEPARTMENT_OTHER): Payer: BC Managed Care – PPO

## 2011-08-03 ENCOUNTER — Emergency Department (HOSPITAL_BASED_OUTPATIENT_CLINIC_OR_DEPARTMENT_OTHER)
Admission: EM | Admit: 2011-08-03 | Discharge: 2011-08-03 | Disposition: A | Discharge status: Home / Self Care | Payer: BC Managed Care – PPO | Attending: Emergency Medicine | Admitting: Emergency Medicine

## 2011-08-03 ENCOUNTER — Encounter (HOSPITAL_BASED_OUTPATIENT_CLINIC_OR_DEPARTMENT_OTHER): Payer: Self-pay | Admitting: *Deleted

## 2011-08-03 DIAGNOSIS — M5030 Other cervical disc degeneration, unspecified cervical region: Secondary | ICD-10-CM | POA: Insufficient documentation

## 2011-08-03 DIAGNOSIS — Z7982 Long term (current) use of aspirin: Secondary | ICD-10-CM | POA: Insufficient documentation

## 2011-08-03 DIAGNOSIS — R350 Frequency of micturition: Secondary | ICD-10-CM | POA: Insufficient documentation

## 2011-08-03 DIAGNOSIS — N23 Unspecified renal colic: Secondary | ICD-10-CM

## 2011-08-03 DIAGNOSIS — Z833 Family history of diabetes mellitus: Secondary | ICD-10-CM | POA: Insufficient documentation

## 2011-08-03 DIAGNOSIS — Z8042 Family history of malignant neoplasm of prostate: Secondary | ICD-10-CM | POA: Insufficient documentation

## 2011-08-03 DIAGNOSIS — Z87440 Personal history of urinary (tract) infections: Secondary | ICD-10-CM | POA: Insufficient documentation

## 2011-08-03 DIAGNOSIS — G4733 Obstructive sleep apnea (adult) (pediatric): Secondary | ICD-10-CM | POA: Insufficient documentation

## 2011-08-03 LAB — URINALYSIS, ROUTINE W REFLEX MICROSCOPIC
Bilirubin Urine: NEGATIVE
Protein, ur: NEGATIVE mg/dL
Urobilinogen, UA: 0.2 mg/dL (ref 0.0–1.0)

## 2011-08-03 LAB — DIFFERENTIAL
Eosinophils Relative: 3 % (ref 0–5)
Lymphocytes Relative: 32 % (ref 12–46)
Lymphs Abs: 2.2 10*3/uL (ref 0.7–4.0)
Monocytes Absolute: 0.6 10*3/uL (ref 0.1–1.0)
Monocytes Relative: 9 % (ref 3–12)

## 2011-08-03 LAB — CBC
MCHC: 34.4 g/dL (ref 30.0–36.0)
MCV: 80.7 fL (ref 78.0–100.0)
Platelets: 168 10*3/uL (ref 150–400)
WBC: 6.9 10*3/uL (ref 4.0–10.5)

## 2011-08-03 LAB — BASIC METABOLIC PANEL
CO2: 28 mEq/L (ref 19–32)
Chloride: 105 mEq/L (ref 96–112)
Creatinine, Ser: 1 mg/dL (ref 0.50–1.35)
GFR calc non Af Amer: 89 mL/min — ABNORMAL LOW (ref >90)
Glucose, Bld: 101 mg/dL — ABNORMAL HIGH (ref 70–99)
Potassium: 4.1 mEq/L (ref 3.5–5.1)
Sodium: 141 mEq/L (ref 135–145)

## 2011-08-03 LAB — URINE MICROSCOPIC-ADD ON

## 2011-08-03 MED ORDER — IBUPROFEN 800 MG PO TABS: 800.0000 mg | ORAL_TABLET | Freq: Three times a day (TID) | ORAL | Status: DC

## 2011-08-03 MED ORDER — OXYCODONE-ACETAMINOPHEN 5-325 MG PO TABS: 1.0000 | ORAL_TABLET | Freq: Four times a day (QID) | ORAL | Status: DC | PRN

## 2011-08-03 MED ORDER — TAMSULOSIN HCL 0.4 MG PO CAPS: 0.4000 mg | ORAL_CAPSULE | Freq: Every day | ORAL | Status: DC

## 2011-08-03 NOTE — Progress Notes (Signed)
Here for Hep and Tdap.

## 2011-08-03 NOTE — Discharge Instructions (Signed)
Ureteral Colic (Kidney Stones) Ureteral colic is the result of a condition when kidney stones form inside the kidney. Once kidney stones are formed they may move into the tube that connects the kidney with the bladder (ureter). If this occurs, this condition may cause pain (colic) in the ureter.  CAUSES  Pain is caused by stone movement in the ureter and the obstruction caused by the stone. SYMPTOMS  The pain comes and goes as the ureter contracts around the stone. The pain is usually intense, sharp, and stabbing in character. The location of the pain may move as the stone moves through the ureter. When the stone is near the kidney the pain is usually located in the back and radiates to the belly (abdomen). When the stone is ready to pass into the bladder the pain is often located in the lower abdomen on the side the stone is located. At this location, the symptoms may mimic those of a urinary tract infection with urinary frequency. Once the stone is located here it often passes into the bladder and the pain disappears completely. TREATMENT   Your caregiver will provide you with medicine for pain relief.   You may require specialized follow-up X-rays.   The absence of pain does not always mean that the stone has passed. It may have just stopped moving. If the urine remains completely obstructed, it can cause loss of kidney function or even complete destruction of the involved kidney. It is your responsibility and in your interest that X-rays and follow-ups as suggested by your caregiver are completed. Relief of pain without passage of the stone can be associated with severe damage to the kidney, including loss of kidney function on that side.   If your stone does not pass on its own, additional measures may be taken by your caregiver to ensure its removal.  HOME CARE INSTRUCTIONS   Increase your fluid intake. Water is the preferred fluid since juices containing vitamin C may acidify the urine making  it less likely for certain stones (uric acid stones) to pass.   Strain all urine. A strainer will be provided. Keep all particulate matter or stones for your caregiver to inspect.   Take your pain medicine as directed.   Make a follow-up appointment with your caregiver as directed.   Remember that the goal is passage of your stone. The absence of pain does not mean the stone is gone. Follow your caregiver's instructions.   Only take over-the-counter or prescription medicines for pain, discomfort, or fever as directed by your caregiver.  SEEK MEDICAL CARE IF:   Pain cannot be controlled with the prescribed medicine.   You have a fever.   Pain continues for longer than your caregiver advises it should.   There is a change in the pain, and you develop chest discomfort or constant abdominal pain.   You feel faint or pass out.  MAKE SURE YOU:   Understand these instructions.   Will watch your condition.   Will get help right away if you are not doing well or get worse.  Document Released: 11/08/2004 Document Revised: 01/18/2011 Document Reviewed: 07/26/2010 [employer] Patient Information 2012 [employer]. 

## 2011-08-03 NOTE — ED Provider Notes (Signed)
History     CSN: [id number]  Arrival date & time 08/03/11  4098   First MD Initiated Contact with Patient 08/03/11 [phone]      Chief Complaint  Patient presents with  . Flank Pain  . Urinary Frequency    (Consider location/radiation/quality/duration/timing/severity/associated sxs/prior treatment) HPI Pt reports about 5 days ago began having R flank pain, similar to multiple prior kidney stones. He took some pain medications and has been doing well since until the last several hours when he has noticed return of pain, mostly in R groin associated with urinary frequency and feeling of incomplete voiding. Denies any fever or vomiting. No pyuria or gross hematuria. He has has prior evaluations and surgery for kidney stones at [facility] including at least 3 CTs and multiple IVPs.   Past Medical History  Diagnosis Date  . DDD (degenerative disc disease), cervical   . Eosinophilic esophagitis     hx of- [facility]  . High triglycerides   . Low HDL (under 40)   . Diverticulitis   . Low testosterone   . OSA (obstructive sleep apnea)   . Migraines   . Normal cardiac stress test     DOBUTAMINE STRESS ECHO    Past Surgical History  Procedure Date  . Nasal sinus surgery     Removal polyps   . Kidney stone surgery     laser at [facility].   . Tonsillectomy and adenoidectomy   . Tympanostomy tube placement   . Esophageal dilation 07/18/11    Dr. Mariana Kaufman    Family History  Problem Relation Age of Onset  . Diabetes Maternal Grandmother   . Prostate cancer Father     oldr when dx     History  Substance Use Topics  . Smoking status: Never Smoker   . Smokeless tobacco: Not on file  . Alcohol Use: No      Review of Systems All other systems reviewed and are negative except as noted in HPI.   Allergies  Niacin  Home Medications   Current Outpatient Rx  Name Route Sig Dispense Refill  . AMBULATORY NON FORMULARY MEDICATION  Medication Name: Glucometer and strips  and lancet to check once a day.  Dx 250.00 1 Units 11  . ASPIRIN 81 MG PO TABS Oral Take 81 mg by mouth daily.      . BUDESONIDE 0.5 MG/2ML IN SUSP Oral Take 0.5 mg by mouth 2 (two) times daily.    Marland Kitchen DICYCLOMINE HCL PO Oral Take by mouth.    Marland Kitchen LISINOPRIL 20 MG PO TABS Oral Take 1 tablet (20 mg total) by mouth daily. 90 tablet 1  . METFORMIN HCL 500 MG PO TABS Oral Take 1 tablet (500 mg total) by mouth 2 (two) times daily with a meal. 60 tablet 3  . RANITIDINE HCL 300 MG PO TABS Oral Take 300 mg by mouth at bedtime.      BP 112/80  Pulse 58  Resp 18  SpO2 100%  Physical Exam  Nursing note and vitals reviewed. Constitutional: He is oriented to person, place, and time. He appears well-developed and well-nourished.  HENT:  Head: Normocephalic and atraumatic.  Eyes: EOM are normal. Pupils are equal, round, and reactive to light.  Neck: Normal range of motion. Neck supple.  Cardiovascular: Normal rate, normal heart sounds and intact distal pulses.   Pulmonary/Chest: Effort normal and breath sounds normal.  Abdominal: Bowel sounds are normal. He exhibits no distension. There is no  tenderness.  Musculoskeletal: Normal range of motion. He exhibits no edema and no tenderness.  Neurological: He is alert and oriented to person, place, and time. He has normal strength. No cranial nerve deficit or sensory deficit.  Skin: Skin is warm and dry. No rash noted.  Psychiatric: He has a normal mood and affect.    ED Course  Procedures (including critical care time)   Labs Reviewed  URINALYSIS, ROUTINE W REFLEX MICROSCOPIC  BASIC METABOLIC PANEL  CBC  DIFFERENTIAL   No results found.   No diagnosis found.    MDM  Pt states pain is minimal now. He is primarily concerned about his kidney function given that he hasn't passed the stone in 5 days. Will check labs, KUB to possibly visualize the stone. Denies need for pain medications at this time.   11:52 AM Pt remains asymptomatic. His labs  are unremarkable. Advised pain meds, flomax and Urology followup if not improved.        Lois Ostrom B. Bernette Mayers, MD 08/03/11 1153

## 2011-08-03 NOTE — ED Notes (Signed)
Pt amb to room 9 with quick steady gait in nad. Pt reports intermittant right flank pain x Sunday, with urinary frequency and little uop since 6am.

## 2011-08-09 ENCOUNTER — Encounter: Payer: Self-pay | Admitting: Family Medicine

## 2011-08-09 ENCOUNTER — Ambulatory Visit (INDEPENDENT_AMBULATORY_CARE_PROVIDER_SITE_OTHER): Payer: BC Managed Care – PPO | Admitting: Family Medicine

## 2011-08-09 DIAGNOSIS — Z23 Encounter for immunization: Secondary | ICD-10-CM

## 2011-08-09 NOTE — Progress Notes (Signed)
  Subjective:    Patient ID: Nicholas Christian, male    DOB: Sep 04, 1965, 46 y.o.   MRN: [id number]  HPI Here for 2nd twinrix injection   Review of Systems     Objective:   Physical Exam        Assessment & Plan:  Patient not seen today nurses visit.

## 2011-08-09 NOTE — Patient Instructions (Signed)
None

## 2011-08-10 ENCOUNTER — Encounter: Payer: Self-pay | Admitting: *Deleted

## 2011-08-13 ENCOUNTER — Ambulatory Visit (INDEPENDENT_AMBULATORY_CARE_PROVIDER_SITE_OTHER): Payer: BC Managed Care – PPO | Admitting: Family Medicine

## 2011-08-13 ENCOUNTER — Encounter: Payer: Self-pay | Admitting: Family Medicine

## 2011-08-13 VITALS — BP 120/79 | HR 69 | Ht 69.0 in | Wt 205.0 lb

## 2011-08-13 DIAGNOSIS — I10 Essential (primary) hypertension: Secondary | ICD-10-CM

## 2011-08-13 DIAGNOSIS — R1310 Dysphagia, unspecified: Secondary | ICD-10-CM

## 2011-08-13 DIAGNOSIS — K222 Esophageal obstruction: Secondary | ICD-10-CM

## 2011-08-13 DIAGNOSIS — E119 Type 2 diabetes mellitus without complications: Secondary | ICD-10-CM

## 2011-08-13 LAB — POCT GLYCOSYLATED HEMOGLOBIN (HGB A1C): Hemoglobin A1C: 5.8

## 2011-08-13 MED ORDER — LISINOPRIL 10 MG PO TABS: 10.0000 mg | ORAL_TABLET | Freq: Every day | ORAL | Status: DC

## 2011-08-13 NOTE — Progress Notes (Signed)
  Subjective:    Patient ID: Nicholas Christian, male    DOB: 12/08/1965, 46 y.o.   MRN: [id number]  HPI Diabetes-here to followup for his diabetes. He has lost 10 pounds since I last saw him in December. On metformin 500 mg once a day. No hypoglycemic events.  No inc thrist or urination.  No cuts or sores on feet.   Hasn't been checking his sugar bc has been well controlled.   HTN- No CP or SOB. His HA have improved.   Review of Systems     Objective:   Physical Exam  Constitutional: He is oriented to person, place, and time. He appears well-developed and well-nourished.  HENT:  Head: Normocephalic and atraumatic.  Cardiovascular: Normal rate, regular rhythm and normal heart sounds.   Pulmonary/Chest: Effort normal and breath sounds normal.  Neurological: He is alert and oriented to person, place, and time.  Skin: Skin is warm and dry.  Psychiatric: He has a normal mood and affect. His behavior is normal.          Assessment & Plan:  DM- doing well and has lost some weight. He is running for exercise.  He would like ot continue the metformin.  F/U in 4 months. Keep up the exercise.   HTN - doing well.  Will cut the lisinopril in half. Recheck BP in 4 months.   Esophageal stricture and eosinophilic esophagitis-he says that he just had endoscopy with the stretching of his esophagus about a month ago. He is still having dysphasia. He says he is always want to go to [facility] but went locally. He says they were good to work with but really wants to go to [facility]. Will make new referral.

## 2011-08-14 ENCOUNTER — Encounter: Payer: Self-pay | Admitting: Family Medicine

## 2011-08-20 ENCOUNTER — Encounter: Payer: Self-pay | Admitting: Internal Medicine

## 2011-08-23 ENCOUNTER — Ambulatory Visit (INDEPENDENT_AMBULATORY_CARE_PROVIDER_SITE_OTHER): Payer: BC Managed Care – PPO | Admitting: Family Medicine

## 2011-08-23 DIAGNOSIS — Z23 Encounter for immunization: Secondary | ICD-10-CM

## 2011-08-23 NOTE — Progress Notes (Signed)
  Subjective:    Patient ID: Nicholas Christian, male    DOB: [birth year], 46 y.o.   MRN: [id number] 3rd Twinrix injection HPI    Review of Systems     Objective:   Physical Exam        Assessment & Plan:

## 2011-09-02 ENCOUNTER — Other Ambulatory Visit: Payer: Self-pay | Admitting: Family Medicine

## 2011-09-14 ENCOUNTER — Ambulatory Visit: Payer: BC Managed Care – PPO | Admitting: Internal Medicine

## 2011-10-31 ENCOUNTER — Encounter: Payer: Self-pay | Admitting: *Deleted

## 2011-10-31 ENCOUNTER — Ambulatory Visit (INDEPENDENT_AMBULATORY_CARE_PROVIDER_SITE_OTHER): Payer: BC Managed Care – PPO | Admitting: Sports Medicine

## 2011-10-31 ENCOUNTER — Emergency Department (INDEPENDENT_AMBULATORY_CARE_PROVIDER_SITE_OTHER): Payer: BC Managed Care – PPO

## 2011-10-31 ENCOUNTER — Emergency Department
Admission: EM | Admit: 2011-10-31 | Discharge: 2011-10-31 | Disposition: A | Discharge status: Home / Self Care | Payer: Self-pay | Source: Home / Self Care | Attending: Family Medicine | Admitting: Family Medicine

## 2011-10-31 DIAGNOSIS — M79609 Pain in unspecified limb: Secondary | ICD-10-CM

## 2011-10-31 DIAGNOSIS — M7741 Metatarsalgia, right foot: Secondary | ICD-10-CM | POA: Insufficient documentation

## 2011-10-31 DIAGNOSIS — M25569 Pain in unspecified knee: Secondary | ICD-10-CM

## 2011-10-31 DIAGNOSIS — M7050 Other bursitis of knee, unspecified knee: Secondary | ICD-10-CM | POA: Insufficient documentation

## 2011-10-31 DIAGNOSIS — M25562 Pain in left knee: Secondary | ICD-10-CM

## 2011-10-31 DIAGNOSIS — M7750 Other enthesopathy of unspecified foot: Secondary | ICD-10-CM

## 2011-10-31 DIAGNOSIS — M76899 Other specified enthesopathies of unspecified lower limb, excluding foot: Secondary | ICD-10-CM

## 2011-10-31 DIAGNOSIS — G5760 Lesion of plantar nerve, unspecified lower limb: Secondary | ICD-10-CM

## 2011-10-31 NOTE — ED Notes (Signed)
Pt c/o RT foot pain x 2 wks, denies injury. He has applied ice. No OTC meds. He also c/o LT knee pain x , denies injury. He states that he began running 6 mths ago.

## 2011-10-31 NOTE — Assessment & Plan Note (Addendum)
Reproduction of pain with resisted knee flexion, as well as tenderness to palpation along the distal semimembranosus, I do suspect that he is semi-membranous bursitis, there also may be an element of tendinitis. Again, we'll assess his gait he comes back to the office. A knee sleeve may be useful. Eccentric hamstring rehabilitation exercises will however be the key to his getting better. Should this fail after approximately 4 weeks, and also consider ultrasound-guided injection into the semimembranosus/semitendinosus tendon sheath. NSAIDs of choice until then. He may continue running.

## 2011-10-31 NOTE — Progress Notes (Signed)
Subjective:    I'm seeing this patient as a consultation for:  Dr. Cathren Harsh  CC: Right foot, left knee pain  HPI: Nicholas Christian is a very pleasant 46 year old male runner, he runs approximately 15-20 miles a week. He's had no change in his mileage, no change in his footwear, no change in his running surface.  Left knee pain: This is been present for approximately 6 months now. He denies any trauma, localize the pain over the distal medial hamstring insertion. Notes that he does get some popping and clicking, but no locking, swelling, or buckling. Running makes it worse, rest better. He hasn't tried any form of over-the-counter treatment yet.  The pain is localized, and does not radiate.  Right foot pain: This is been present for several days to a week, he localizes the the plantar metatarsophalangeal joint of the second ray. Again, worse with running, better with rest. Hasn't tried any form of footwear adjustment, or home treatment.  Past medical history, Surgical history, Family history, Social history, Allergies, and medications have been entered into the medical record, reviewed, and no changes needed.   Review of Systems: No headache, visual changes, nausea, vomiting, diarrhea, constipation, dizziness, abdominal pain, skin rash, fevers, chills, night sweats, weight loss, body aches, joint swelling, muscle aches, chest pain, or shortness of breath.   Objective:   Vitals:  Afebrile, vital signs stable. General: Well Developed, well nourished, and in no acute distress.  Neuro/Psych: Alert and oriented x3, extra-ocular muscles intact, able to move all 4 extremities.  Skin: Warm and dry, no rashes noted.  Respiratory: Not using accessory muscles, speaking in full sentences, trachea midline.  Cardiovascular: Pulses palpable, no extremity edema. Abdomen: Does not appear distended. Left knee: Normal to inspection with no erythema or effusion or obvious bony abnormalities. There is a discrete area of  tenderness to palpation along the distal semimembranosus. This pain is reproducible with resisted knee flexion. ROM full in flexion and extension and lower leg rotation. Ligaments with solid consistent endpoints including ACL, PCL, LCL, MCL. Negative Mcmurray's, Apley's, and Thessalonian tests. Non painful patellar compression. Patellar glide without crepitus. Patellar and quadriceps tendons unremarkable. Hamstring and quadriceps strength is normal.   Right foot: There is a small breakdown of the transverse arch. He does have reproduction of his pain along the plantar aspect of the second metatarsophalangeal joint suggestive of metatarsalgia.  He has no tenderness to palpation over the dorsum of the metatarsal shafts to suggest a stress injury.  Leg lengths are equal. Hip abductor strength is weak on the right side. Overall foot shape is unremarkable.  I did review x-rays of his knee, left side, and foot, right side. These are negative, and show no sign of fracture, dislocation, or degenerative change.  Impression and Recommendations:   This case required medical decision making of moderate complexity.

## 2011-10-31 NOTE — Assessment & Plan Note (Signed)
This will likely resolve with metatarsal pads, and appropriate footwear. I will see him back in the office at my next available slot, at which point we will do some deep analysis, place and metatarsal pad. He can do NSAIDs of choice until then.

## 2011-10-31 NOTE — ED Provider Notes (Signed)
History     CSN: [id number]  Arrival date & time 10/31/11  1120   First MD Initiated Contact with Patient 10/31/11 1149      Chief Complaint  Patient presents with  . Foot Pain  . Knee Pain      HPI Comments: Patient has been running regularly (3 to 5 times per week) for about 6 months.  He complains of 2 week history of pain in his left distal foot.  Recalls no trauma.  Foot is stiff in the morning, and improves somewhat after running. He also complains of persistent ache in his left posterior knee that began when he started running.  No swelling.  No sensation of locking or giving way.  Patient is a 46 y.o. male presenting with lower extremity pain. The history is provided by the patient.  Foot Pain This is a new problem. Episode onset: 2 weeks ago. The problem occurs daily. The problem has not changed since onset.Associated symptoms comments: none. The symptoms are aggravated by walking. The symptoms are relieved by NSAIDs. Treatments tried: ice and ibuprofen. The treatment provided mild relief.    Past Medical History  Diagnosis Date  . DDD (degenerative disc disease), cervical   . Eosinophilic esophagitis     hx of- [facility]  . High triglycerides   . Low HDL (under 40)   . Diverticulitis   . Low testosterone   . OSA (obstructive sleep apnea)   . Migraines   . Normal cardiac stress test     DOBUTAMINE STRESS ECHO    Past Surgical History  Procedure Date  . Nasal sinus surgery     Removal polyps   . Kidney stone surgery     laser at [facility].   . Tonsillectomy and adenoidectomy   . Tympanostomy tube placement   . Esophageal dilation 07/18/11    Dr. Mariana Kaufman    Family History  Problem Relation Age of Onset  . Diabetes Maternal Grandmother   . Prostate cancer Father     oldr when dx     History  Substance Use Topics  . Smoking status: Never Smoker   . Smokeless tobacco: Not on file  . Alcohol Use: No      Review of Systems  All other systems  reviewed and are negative.    Allergies  Niacin  Home Medications   Current Outpatient Rx  Name Route Sig Dispense Refill  . AMBULATORY NON FORMULARY MEDICATION  Medication Name: Glucometer and strips and lancet to check once a day.  Dx 250.00 1 Units 11  . ASPIRIN 81 MG PO TABS Oral Take 81 mg by mouth daily.      . BUDESONIDE 0.5 MG/2ML IN SUSP Oral Take 0.5 mg by mouth 2 (two) times daily.    Marland Kitchen LISINOPRIL 10 MG PO TABS Oral Take 1 tablet (10 mg total) by mouth daily. 90 tablet 1  . METFORMIN HCL 500 MG PO TABS Oral Take 500 mg by mouth daily with breakfast.    . METFORMIN HCL 500 MG PO TABS  TAKE 1 TABLET BY MOUTH TWICE DAILY WITH A MEAL 60 tablet 2    BP 134/87  Pulse 54  Temp 98 F (36.7 C) (Oral)  Resp 16  Ht 5' 11.5" (1.816 m)  Wt 202 lb (91.627 kg)  BMI 27.78 kg/m2  SpO2 98%  Physical Exam  Nursing note and vitals reviewed. Constitutional: He is oriented to person, place, and time. He appears well-developed and  well-nourished. No distress.  Eyes: Conjunctivae normal are normal. Pupils are equal, round, and reactive to light.  Musculoskeletal: Normal range of motion. He exhibits tenderness.       Left knee: He exhibits normal range of motion, no swelling, no effusion, no ecchymosis, no deformity, no laceration, no erythema, normal alignment, no LCL laxity, normal patellar mobility, no bony tenderness, normal meniscus and no MCL laxity. tenderness found. No medial joint line, no lateral joint line, no MCL, no LCL and no patellar tendon tenderness noted.       Legs:      Right foot: He exhibits tenderness and bony tenderness. He exhibits normal range of motion, no swelling, normal capillary refill, no crepitus, no deformity and no laceration.       Left knee reveals some vague fullness/tenderness in the popliteal fossae.  McMurray test negative.  Knee stable  Right foot reveals mild tenderness over the 2nd and 3rd MTP joints and proximal phalanges.  Also tenderness over  IP joints.  Neurological: He is alert and oriented to person, place, and time.  Skin: Skin is warm and dry. No rash noted.    ED Course  Procedures  none  Labs Reviewed - No data to display Dg Knee 2 Views Left  10/31/2011  *RADIOLOGY REPORT*  Clinical Data: Left knee pain for 6 months, the patient is a runner  LEFT KNEE - 1-2 VIEW  Comparison: None.  Findings: The knee joint spaces appear normal.  No fracture is seen.  No effusion is noted.  IMPRESSION: Negative.   Original Report Authenticated By: Juline Patch, M.D.    Dg Foot 2 Views Right  10/31/2011  *RADIOLOGY REPORT*  Clinical Data: Foot pain for 2 weeks, and the patient is a runner  RIGHT FOOT - 2 VIEW  Comparison: None.  Findings: Tarsal - metatarsal alignment is normal.  Joint spaces appear normal.  No acute bony abnormality is seen.  No evidence of stress fracture is seen.  IMPRESSION: Negative.   Original Report Authenticated By: Juline Patch, M.D.      1. Morton's neuroma   2. Left knee pain      MDM  Continue Ibuprofen 200mg , 4 tabs every 8 hours with food.  Suggest wearing elastic knee sleeve left knee (patient already has) Consultation arranged with Dr. Tyson Babinski, MD 10/31/11 [phone]

## 2011-11-01 ENCOUNTER — Encounter: Payer: Self-pay | Admitting: Sports Medicine

## 2011-11-01 ENCOUNTER — Ambulatory Visit (INDEPENDENT_AMBULATORY_CARE_PROVIDER_SITE_OTHER): Payer: BC Managed Care – PPO | Admitting: Sports Medicine

## 2011-11-01 VITALS — BP 115/74 | HR 76 | Wt 201.0 lb

## 2011-11-01 DIAGNOSIS — M7741 Metatarsalgia, right foot: Secondary | ICD-10-CM

## 2011-11-01 DIAGNOSIS — M76899 Other specified enthesopathies of unspecified lower limb, excluding foot: Secondary | ICD-10-CM

## 2011-11-01 DIAGNOSIS — M7750 Other enthesopathy of unspecified foot: Secondary | ICD-10-CM

## 2011-11-01 DIAGNOSIS — M7050 Other bursitis of knee, unspecified knee: Secondary | ICD-10-CM

## 2011-11-01 MED ORDER — MELOXICAM 15 MG PO TABS: ORAL_TABLET | ORAL | Status: DC

## 2011-11-01 NOTE — Patient Instructions (Addendum)
Metatarsal cookie. Try line drills, focus on pointing your feet straight when running. Knee sleeve while running.  Trochanteric Bursitis Rehab.  Mobic.  For your knee: Hamstring curls: Start with 3 sets of 15 (no weight); Progress by 5 reps every 3 days until you reach 3 sets of 30; After 3 days at 3 sets of 30, add 2lb ankle weight at 3 sets of 10; Increase every 5 days by 5 reps. You may add 2lbs ankle weight once weekly. Hamstring swings- swing leg backwards and curl at the end of the swing. Follow same schedule as above. Hamstring running lunges- running lunge position means no more than 45 degrees of knee flexion and running motion. Follow same schedule as above.  Advanced HS rehab (for later) Plyometrics: bound and land in running position; 3 sets of 15 level first; then up a rise; then down a rise; only advance when level is easy. No greater than 45 deg knee flexion. Stride drills- 10 x 75 yards; bounding but no more than 45 deg knee flexion. Level for 1 week; then add up hill for 1 week; then add down hill. Hop drills- 15 repetitive hops- 3 sets; compare injured to non injured leg Nordic hamstring exercises- stabilize body kneeling;1 set of 5-10 and progress slowly(Don't attempt if painful)

## 2011-11-01 NOTE — Assessment & Plan Note (Signed)
Metatarsal cookie placed under right foot. Mobic. He'll come back to see me in 4 weeks for this. If no better we can consider custom orthotics.

## 2011-11-01 NOTE — Progress Notes (Signed)
Subjective:    CC: Followup right foot metatarsalgia, left semimembranosus tendinosis.  HPI: Right metatarsalgia: Presently the second metatarsophalangeal joint. Please see consultation note for specific details. Pain is localized, does not radiate, no changes in gait, footwear, or running surface.   Left semimembranosus tendinosis: Reproducible with resisted knee flexion, some crepitus palpable over the distal remember doses, and semitendinosus tendons. He does have known very tight hamstrings. Has not had any NSAIDs, or form of rehabilitation for this. He does have a knee sleeve at home but he does not wear.  Past medical history, Surgical history, Family history, Social history, Allergies, and medications have been entered into the medical record, reviewed, and no changes needed.   Review of Systems: No fevers, chills, night sweats, weight loss, chest pain, or shortness of breath.   Objective:    General: Well Developed, well nourished, and in no acute distress.  Neuro: Alert and oriented x3, extra-ocular muscles intact.  HEENT: Normocephalic, atraumatic, pupils equal round reactive to light, neck supple, no masses, no lymphadenopathy, thyroid nonpalpable.  Skin: Warm and dry, no rashes. Cardiac: Regular rate and rhythm, no murmurs rubs or gallops.  Respiratory: Clear to auscultation bilaterally. Not using accessory muscles, speaking in full sentences. Left knee tender to palpation at distal semitendinosus/semimembranosus. Reproducible with resisted knee flexion. Palpable crepitus over the distal tendon. He also has tenderness to palpation over both trochanteric bursae. Tender to palpation just distal to the metatarsal head of the second right metatarsal.  Foot shape is unremarkable.  He runs with a very peculiar gait. It is somewhat bounding, he is a forefoot striker. He turns out his left foot significantly when running. He also has a mildly Trendelenburg type gait.  Impression  and Recommendations:

## 2011-11-01 NOTE — Assessment & Plan Note (Signed)
I would like him to work on both hip abductor/trochanteric bursitis rehabilitation, as well as home hamstring rehabilitation. See after visit summary for specifics on the prescribed rehabilitation. He will also do MOBIC. Also like him to wear his knee sleeve. Come back to see me in 4 weeks, and if no better we can consider an ultrasound, guided injection into the sheath of the semi-membranous/semitendinosus. If he is doing significantly better at that point, I'll progress him to the advanced hamstring rehabilitation protocol.

## 2011-11-22 ENCOUNTER — Ambulatory Visit: Payer: BC Managed Care – PPO | Admitting: Sports Medicine

## 2011-11-22 DIAGNOSIS — Z0289 Encounter for other administrative examinations: Secondary | ICD-10-CM

## 2011-12-14 ENCOUNTER — Ambulatory Visit: Payer: BC Managed Care – PPO | Admitting: Family Medicine

## 2011-12-14 DIAGNOSIS — Z0289 Encounter for other administrative examinations: Secondary | ICD-10-CM

## 2012-01-03 HISTORY — PX: OTHER SURGICAL HISTORY: SHX169

## 2012-01-29 ENCOUNTER — Encounter: Payer: Self-pay | Admitting: Family Medicine

## 2012-02-11 ENCOUNTER — Ambulatory Visit (INDEPENDENT_AMBULATORY_CARE_PROVIDER_SITE_OTHER): Payer: BC Managed Care – PPO | Admitting: Sports Medicine

## 2012-02-11 VITALS — BP 109/75 | HR 74 | Wt 209.0 lb

## 2012-02-11 DIAGNOSIS — S71059A Open bite, unspecified hip, initial encounter: Secondary | ICD-10-CM | POA: Insufficient documentation

## 2012-02-11 DIAGNOSIS — S71009A Unspecified open wound, unspecified hip, initial encounter: Secondary | ICD-10-CM

## 2012-02-11 DIAGNOSIS — W540XXA Bitten by dog, initial encounter: Secondary | ICD-10-CM

## 2012-02-11 MED ORDER — CEPHALEXIN 500 MG PO CAPS: 500.0000 mg | ORAL_CAPSULE | Freq: Four times a day (QID) | ORAL | Status: DC

## 2012-02-11 MED ORDER — HYDROCODONE-ACETAMINOPHEN 5-325 MG PO TABS: 1.0000 | ORAL_TABLET | Freq: Three times a day (TID) | ORAL | Status: DC | PRN

## 2012-02-11 NOTE — Patient Instructions (Addendum)
Animal Bite An animal bite can result in a scratch on the skin, deep open cut, puncture of the skin, crush injury, or tearing away of the skin or a body part. Dogs are responsible for most animal bites. Children are bitten more often than adults. An animal bite can range from very mild to more serious. A small bite from your house pet is no cause for alarm. However, some animal bites can become infected or injure a bone or other tissue. You must seek medical care if:  The skin is broken and bleeding does not slow down or stop after 15 minutes.  The puncture is deep and difficult to clean (such as a cat bite).  Pain, warmth, redness, or pus develops around the wound.  The bite is from a stray animal or rodent. There may be a risk of rabies infection.  The bite is from a snake, raccoon, skunk, fox, coyote, or bat. There may be a risk of rabies infection.  The person bitten has a chronic illness such as diabetes, liver disease, or cancer, or the person takes medicine that lowers the immune system.  There is concern about the location and severity of the bite. It is important to clean and protect an animal bite wound right away to prevent infection. Follow these steps:  Clean the wound with plenty of water and soap.  Apply an antibiotic cream.  Apply gentle pressure over the wound with a clean towel or gauze to slow or stop bleeding.  Elevate the affected area above the heart to help stop any bleeding.  Seek medical care. Getting medical care within 8 hours of the animal bite leads to the best possible outcome. DIAGNOSIS  Your caregiver will most likely:  Take a detailed history of the animal and the bite injury.  Perform a wound exam.  Take your medical history. Blood tests or X-rays may be performed. Sometimes, infected bite wounds are cultured and sent to a lab to identify the infectious bacteria.  TREATMENT  Medical treatment will depend on the location and type of animal bite as  well as the patient's medical history. Treatment may include:  Wound care, such as cleaning and flushing the wound with saline solution, bandaging, and elevating the affected area.  Antibiotics.  Tetanus immunization.  Rabies immunization.  Leaving the wound open to heal. This is often done with animal bites, due to the high risk of infection. However, in certain cases, wound closure with stitches, wound adhesive, skin adhesive strips, or staples may be used. Infected bites that are left untreated may require intravenous (IV) antibiotics and surgical treatment in the hospital. HOME CARE INSTRUCTIONS  Follow your caregiver's instructions for wound care.  Take all medicines as directed.  If your caregiver prescribes antibiotics, take them as directed. Finish them even if you start to feel better.  Follow up with your caregiver for further exams or immunizations as directed. You may need a tetanus shot if:  You cannot remember when you had your last tetanus shot.  You have never had a tetanus shot.  The injury broke your skin. If you get a tetanus shot, your arm may swell, get red, and feel warm to the touch. This is common and not a problem. If you need a tetanus shot and you choose not to have one, there is a rare chance of getting tetanus. Sickness from tetanus can be serious. SEEK MEDICAL CARE IF:  You notice warmth, redness, soreness, swelling, pus discharge, or a bad   smell coming from the wound.  You have a red line on the skin coming from the wound.  You have a fever, chills, or a general ill feeling.  You have nausea or vomiting.  You have continued or worsening pain.  You have trouble moving the injured part.  You have other questions or concerns. MAKE SURE YOU:  Understand these instructions.  Will watch your condition.  Will get help right away if you are not doing well or get worse. Document Released: 10/17/2010 Document Revised: 04/23/2011 Document  Reviewed: 10/17/2010 [employer] Patient Information 2013 [employer].  

## 2012-02-11 NOTE — Progress Notes (Signed)
Subjective:    CC: Bite  HPI: This is a very pleasant 46 year old male who unfortunately yesterday suffered a bite on his left lateral hip by a domestic dog. It sounds as though a child was holding the dog bites leash, the dog away, and bit Nicholas Christian.  It bled slightly, and he did note 2 puncture marks.  He denies any fevers, chills, or drainage, but does note that it's very painful. The pain is localized, does not radiate. He had his tetanus shot a few months ago.  Past medical history, Surgical history, Family history, Social history, Allergies, and medications have been entered into the medical record, reviewed, and no changes needed.   Review of Systems: No fevers, chills, night sweats, weight loss, chest pain, or shortness of breath.   Objective:    General: Well Developed, well nourished, and in no acute distress.  Neuro: Alert and oriented x3, extra-ocular muscles intact.  HEENT: Normocephalic, atraumatic, pupils equal round reactive to light, neck supple, no masses, no lymphadenopathy, thyroid nonpalpable.  Skin: Warm and dry, no rashes. There is some bruising with 2 superficial puncture marks on his left hip. These are scabbed over. There is no drainage, it is mildly tender to palpation with palpable hematoma beneath the skin.  There is mild overlying erythema. Cardiac: Regular rate and rhythm, no murmurs rubs or gallops.  Respiratory: Clear to auscultation bilaterally. Not using accessory muscles, speaking in full sentences.  Impression and Recommendations:

## 2012-02-11 NOTE — Assessment & Plan Note (Signed)
Bitten by a Pharmacist, community, yesterday. Wound appears mildly cellulitic with significant bruising. He is up-to-date on tetanus vaccination which was done this summer. We will give him cephalexin for 10 days, and Vicodin. I would like to see him back if no better after 10 days.

## 2012-03-13 ENCOUNTER — Other Ambulatory Visit: Payer: Self-pay | Admitting: Family Medicine

## 2012-03-31 ENCOUNTER — Encounter: Payer: Self-pay | Admitting: Family Medicine

## 2012-03-31 ENCOUNTER — Ambulatory Visit (INDEPENDENT_AMBULATORY_CARE_PROVIDER_SITE_OTHER): Payer: BC Managed Care – PPO | Admitting: Family Medicine

## 2012-03-31 VITALS — BP 102/69 | HR 86 | Ht 71.0 in | Wt 218.0 lb

## 2012-03-31 DIAGNOSIS — J040 Acute laryngitis: Secondary | ICD-10-CM

## 2012-03-31 DIAGNOSIS — R05 Cough: Secondary | ICD-10-CM

## 2012-03-31 DIAGNOSIS — T464X5A Adverse effect of angiotensin-converting-enzyme inhibitors, initial encounter: Secondary | ICD-10-CM

## 2012-03-31 DIAGNOSIS — R059 Cough, unspecified: Secondary | ICD-10-CM

## 2012-03-31 MED ORDER — LOSARTAN POTASSIUM 25 MG PO TABS: 25.0000 mg | ORAL_TABLET | Freq: Every day | ORAL | Status: DC

## 2012-03-31 MED ORDER — AZITHROMYCIN 250 MG PO TABS: ORAL_TABLET | ORAL | Status: DC

## 2012-03-31 NOTE — Patient Instructions (Addendum)
Call if not better in one weekl

## 2012-03-31 NOTE — Progress Notes (Signed)
  Subjective:    Patient ID: Nicholas Christian, male    DOB: 02-12-1966, 47 y.o.   MRN: [id number]  HPI  Cough  3weeks tried mucinex, tylenol sinus, no fever.  bilat frontal pressure. No nasal congestion adn post nasal drip.  Says his cough is better at night  Cough worse during the day.  Has had mild ST.  Has been hoarse this whole time. Doesn't use his CPAP.  No ear pain. Flew aobut a month ago when had a cold but says that got better and this started a week later. Taking nexium daily.  Says really doesn' t feel bad. No fever or pain.  No Gi sxs.  He does have a history of eosinophilic esophagitis. I also had an help with the steroid on his medication list he says he has not been using it.  Review of Systems     Objective:   Physical Exam  Constitutional: He is oriented to person, place, and time. He appears well-developed and well-nourished.  HENT:  Head: Normocephalic and atraumatic.  Right Ear: External ear normal.  Left Ear: External ear normal.  Nose: Nose normal.  Mouth/Throat: Oropharynx is clear and moist.  TMs and canals are clear.   Eyes: Conjunctivae and EOM are normal. Pupils are equal, round, and reactive to light.  Neck: Neck supple. No thyromegaly present.  Cardiovascular: Normal rate and normal heart sounds.   Pulmonary/Chest: Effort normal and breath sounds normal.  Lymphadenopathy:    He has no cervical adenopathy.  Neurological: He is alert and oriented to person, place, and time.  Skin: Skin is warm and dry.  Psychiatric: He has a normal mood and affect.    His voice is very hoarse today.      Assessment & Plan:  Cough x3 weeks with voice hoarseness-this certainly could be bacterial or viral. Also consider reflux that he started taking Nexium daily. Also consider that the cough could be caused by the ACE inhibitor that he has been on for quite some time. We discussed different options today. He's not having any significant shortness of breath and vitals were  normal so we'll treat him with a course of azithromycin for the cough. We will also change his ACE inhibitor to an ARB to see if this could be causing his symptoms. Continue Nexium as well to cover for possible GERD. Make sure avoiding greasy, spicy and acidic foods. It is not significantly better in the next 7-10 days I would like him to call the office back and we'll get a chest x-ray at that point in time. Since he is not really using his inhaled corticosteroid I think something such as fungal infection of the larynx or vocal cord is less likely.

## 2012-04-28 ENCOUNTER — Ambulatory Visit (INDEPENDENT_AMBULATORY_CARE_PROVIDER_SITE_OTHER): Payer: BC Managed Care – PPO | Admitting: Family Medicine

## 2012-04-28 ENCOUNTER — Encounter: Payer: Self-pay | Admitting: Family Medicine

## 2012-04-28 VITALS — BP 113/74 | HR 65 | Ht 71.0 in | Wt 226.0 lb

## 2012-04-28 DIAGNOSIS — E119 Type 2 diabetes mellitus without complications: Secondary | ICD-10-CM

## 2012-04-28 DIAGNOSIS — I10 Essential (primary) hypertension: Secondary | ICD-10-CM

## 2012-04-28 LAB — POCT GLYCOSYLATED HEMOGLOBIN (HGB A1C): Hemoglobin A1C: 6

## 2012-04-28 MED ORDER — PRAVASTATIN SODIUM 20 MG PO TABS: 20.0000 mg | ORAL_TABLET | Freq: Every day | ORAL | Status: DC

## 2012-04-28 NOTE — Patient Instructions (Signed)
Remember to get your eye exam.

## 2012-04-28 NOTE — Progress Notes (Signed)
  Subjective:    Patient ID: Nicholas Christian, male    DOB: [birth year], 47 y.o.   MRN: [id number]  HPI DM- Says diet has not been as well controlled. Still one metformin once a day. No wounds that arent healing.  No hypoglycemic events.  Starting running again recently. Has gaine over 10 lbs.    HTN-  Pt denies chest pain, SOB, dizziness, or heart palpitations.  Taking meds as directed w/o problems.  Denies medication side effects.     Review of Systems     Objective:   Physical Exam  Constitutional: He is oriented to person, place, and time. He appears well-developed and well-nourished.  HENT:  Head: Normocephalic and atraumatic.  Cardiovascular: Normal rate, regular rhythm and normal heart sounds.   Pulmonary/Chest: Effort normal and breath sounds normal.  Neurological: He is alert and oriented to person, place, and time.  Skin: Skin is warm and dry.  Psychiatric: He has a normal mood and affect. His behavior is normal.          Assessment & Plan:  DM- Well controlled.  F/U in 4 months.  Work on diet nad weigh loss.  Weight is up 25 lbs from last summer. He agrees to work on this. He is on aspirin and are but is not on a statin. We discussed this on new current cholesterol guidelines all diabetics should be on a statin. His last cholesterol was drawn in December 2012 and was normal. We'll recheck CMP and fasting lipid panel today and start low-dose pravastatin 20 mg at bedtime. At some point if he can get his weight back down to get his sugars under control we might be able to recross up by him as impaired fasting glucose rather than diabetes in which case we can discuss whether not he might need a statin at that time. Lab Results  Component Value Date   HGBA1C 6.0 04/28/2012   HTN- well controlled. Continue current regimen.  Fatty liver disease-encouraged weight loss and due to recheck liver enzymes today.

## 2012-04-29 LAB — COMPLETE METABOLIC PANEL WITH GFR
Albumin: 4.8 g/dL (ref 3.5–5.2)
BUN: 16 mg/dL (ref 6–23)
CO2: 31 mEq/L (ref 19–32)
GFR, Est African American: 86 mL/min
GFR, Est Non African American: 74 mL/min
Glucose, Bld: 91 mg/dL (ref 70–99)
Sodium: 140 mEq/L (ref 135–145)
Total Bilirubin: 0.5 mg/dL (ref 0.3–1.2)
Total Protein: 7.2 g/dL (ref 6.0–8.3)

## 2012-04-29 LAB — LIPID PANEL: HDL: 44 mg/dL (ref >39)

## 2012-05-27 ENCOUNTER — Other Ambulatory Visit: Payer: Self-pay | Admitting: Family Medicine

## 2012-05-28 ENCOUNTER — Encounter: Payer: Self-pay | Admitting: *Deleted

## 2012-05-28 ENCOUNTER — Emergency Department
Admission: EM | Admit: 2012-05-28 | Discharge: 2012-05-28 | Disposition: A | Discharge status: Home / Self Care | Payer: BC Managed Care – PPO | Source: Home / Self Care | Attending: Family Medicine | Admitting: Family Medicine

## 2012-05-28 ENCOUNTER — Emergency Department (INDEPENDENT_AMBULATORY_CARE_PROVIDER_SITE_OTHER): Payer: BC Managed Care – PPO

## 2012-05-28 DIAGNOSIS — S29011A Strain of muscle and tendon of front wall of thorax, initial encounter: Secondary | ICD-10-CM

## 2012-05-28 DIAGNOSIS — S2341XA Sprain of ribs, initial encounter: Secondary | ICD-10-CM

## 2012-05-28 DIAGNOSIS — S239XXA Sprain of unspecified parts of thorax, initial encounter: Secondary | ICD-10-CM

## 2012-05-28 DIAGNOSIS — J9819 Other pulmonary collapse: Secondary | ICD-10-CM

## 2012-05-28 DIAGNOSIS — S29012A Strain of muscle and tendon of back wall of thorax, initial encounter: Secondary | ICD-10-CM

## 2012-05-28 MED ORDER — CYCLOBENZAPRINE HCL 10 MG PO TABS: 10.0000 mg | ORAL_TABLET | Freq: Two times a day (BID) | ORAL | Status: DC | PRN

## 2012-05-28 MED ORDER — MELOXICAM 15 MG PO TABS: 15.0000 mg | ORAL_TABLET | Freq: Every day | ORAL | Status: DC

## 2012-05-28 NOTE — ED Notes (Signed)
Nicholas Christian c/o left upper back pain x 1 week. He was pushing a box then felt a "pop". No previous injuries. Used ice and heat and old rx of hydrocodone for pain relief.

## 2012-05-28 NOTE — ED Provider Notes (Signed)
History     CSN: [id number]  Arrival date & time 05/28/12  1151   None     Chief Complaint  Patient presents with  . Back Pain       HPI Comments: Patient awoke about 9 days ago with vague soreness in his right neck that has improved.  Six days ago he pushed on a box and felt a sudden popping sensation in his left upper back that has persisted.  Pain is present with any upper body movement.  No shortness of breath.  Patient is a 47 y.o. male presenting with back pain. The history is provided by the patient.  Back Pain Pain location: left upper back. Quality:  Aching Radiates to:  Does not radiate Pain severity:  Mild Pain is:  Worse during the day Onset quality:  Sudden Duration:  6 days Timing:  Constant Progression:  Unchanged Chronicity:  New Context: emotional stress   Relieved by:  Nothing Worsened by:  Movement Ineffective treatments:  NSAIDs Associated symptoms: no abdominal pain, no abdominal swelling, no chest pain, no fever, no headaches, no tingling and no weakness     Past Medical History  Diagnosis Date  . DDD (degenerative disc disease), cervical   . Eosinophilic esophagitis     hx of- [facility]  . High triglycerides   . Low HDL (under 40)   . Diverticulitis   . Low testosterone   . OSA (obstructive sleep apnea)   . Migraines   . Normal cardiac stress test     DOBUTAMINE STRESS ECHO    Past Surgical History  Procedure Laterality Date  . Nasal sinus surgery      Removal polyps   . Kidney stone surgery      laser at [facility].   . Tonsillectomy and adenoidectomy    . Tympanostomy tube placement    . Esophageal dilation  07/18/11    Dr. Mariana Kaufman  . Edg  01/03/12    Family History  Problem Relation Age of Onset  . Diabetes Maternal Grandmother   . Prostate cancer Father     oldr when dx     History  Substance Use Topics  . Smoking status: Never Smoker   . Smokeless tobacco: Never Used  . Alcohol Use: No      Review of Systems   Constitutional: Negative for fever.  Cardiovascular: Negative for chest pain.  Gastrointestinal: Negative for abdominal pain.  Musculoskeletal: Positive for back pain.  Neurological: Negative for tingling, weakness and headaches.    Allergies  Niacin  Home Medications   Current Outpatient Rx  Name  Route  Sig  Dispense  Refill  . AMBULATORY NON FORMULARY MEDICATION      Medication Name: Glucometer and strips and lancet to check once a day.  Dx 250.00   1 Units   11   . aspirin 81 MG tablet   Oral   Take 81 mg by mouth daily.           . cyclobenzaprine (FLEXERIL) 10 MG tablet   Oral   Take 1 tablet (10 mg total) by mouth 2 (two) times daily as needed for muscle spasms.   20 tablet   0   . losartan (COZAAR) 25 MG tablet   Oral   Take 1 tablet (25 mg total) by mouth daily.   30 tablet   2   . meloxicam (MOBIC) 15 MG tablet   Oral   Take 1 tablet (15 mg  total) by mouth daily. Take with food each morning   10 tablet   0   . metFORMIN (GLUCOPHAGE) 500 MG tablet      TAKE 1 TABLET BY MOUTH TWICE DAILY WITH A MEAL   60 tablet   2   . pravastatin (PRAVACHOL) 20 MG tablet   Oral   Take 1 tablet (20 mg total) by mouth daily.   30 tablet   6     BP 138/83  Pulse 63  Resp 14  Ht 5\' 11"  (1.803 m)  Wt 230 lb (104.327 kg)  BMI 32.09 kg/m2  SpO2 98%  Physical Exam  Nursing note and vitals reviewed. Constitutional: He is oriented to person, place, and time. He appears well-developed and well-nourished. No distress.  HENT:  Head: Atraumatic.  Eyes: Conjunctivae are normal. Pupils are equal, round, and reactive to light.  Neck: Normal range of motion. Neck supple.  Non-tender  Cardiovascular: Normal rate, regular rhythm and normal heart sounds.   Pulmonary/Chest: Effort normal and breath sounds normal. No respiratory distress.   He exhibits tenderness. He exhibits no bony tenderness.  Localized areas of muscular tenderness in blue.  Localized rib  tenderness noted in red.  Lymphadenopathy:    He has no cervical adenopathy.  Neurological: He is alert and oriented to person, place, and time.  Skin: Skin is warm and dry. No rash noted.    ED Course  Procedures  none   Dg Ribs Unilateral W/chest Left  05/28/2012  *RADIOLOGY REPORT*  Clinical Data: Left chest and back pain with point tenderness overlying the lower left ribs.  LEFT RIBS AND CHEST - 3+ VIEW  Comparison: Thoracic spine films on 11/24/2007  Findings: Frontal chest radiograph shows bibasilar scarring and atelectasis.  No pneumothorax, pleural effusion or pulmonary consolidation is identified.  Left rib films show no evidence of acute fracture, bony destruction or bony lesion.  IMPRESSION: Bibasilar atelectasis.  No evidence of left rib fracture.   Original Report Authenticated By: Irish Lack, M.D.      1. Intercostal muscle strain, initial encounter   2. Rhomboid muscle strain, initial encounter       MDM  Begin Mobic and Flexeril for about 10 days. Tried a rib belt in office, but no improvement noted. Apply ice pack 2 or 3 times daily.  Begin range of motion exercises in about 3 to 5 days. Followup with [facility] if not improving about two weeks.         Lattie Haw, MD 05/29/12 [phone]

## 2012-06-20 ENCOUNTER — Ambulatory Visit (INDEPENDENT_AMBULATORY_CARE_PROVIDER_SITE_OTHER): Payer: BC Managed Care – PPO | Admitting: Family Medicine

## 2012-06-20 ENCOUNTER — Encounter: Payer: Self-pay | Admitting: Family Medicine

## 2012-06-20 ENCOUNTER — Ambulatory Visit (INDEPENDENT_AMBULATORY_CARE_PROVIDER_SITE_OTHER): Payer: BC Managed Care – PPO

## 2012-06-20 VITALS — BP 129/84 | HR 74 | Ht 71.0 in | Wt 223.0 lb

## 2012-06-20 DIAGNOSIS — R05 Cough: Secondary | ICD-10-CM

## 2012-06-20 DIAGNOSIS — R0789 Other chest pain: Secondary | ICD-10-CM

## 2012-06-20 DIAGNOSIS — M7918 Myalgia, other site: Secondary | ICD-10-CM

## 2012-06-20 DIAGNOSIS — R059 Cough, unspecified: Secondary | ICD-10-CM

## 2012-06-20 DIAGNOSIS — I517 Cardiomegaly: Secondary | ICD-10-CM

## 2012-06-20 DIAGNOSIS — J984 Other disorders of lung: Secondary | ICD-10-CM

## 2012-06-20 NOTE — Progress Notes (Signed)
  Subjective:    Patient ID: Nicholas Christian, male    DOB: [birth year], 47 y.o.   MRN: [id number]  HPI Says cough improved after I saw him oin FEb without changing his BP med. He has continued his ACEi.  Says started again about 3 weeks ago and cough has been more productive.  He does does take claritin.  He has had allergy testing.  Sputum has been yello. No SOB, but says not keeping him from running.  Started having some left sided CP in the center of chest that radiates into his back. Says it almost feels like a sore bruised area.  Says NSAIDs didn't help.  No heartburn or reflux sxs. ON nexium. CP last most of the day but says days not as sore.  Did a CXR 4 weeks ago. No runny nose, but some mild congestion.  No hx of asthma  In the room cough sounds very dry.   Review of Systems     Objective:   Physical Exam  Constitutional: He is oriented to person, place, and time. He appears well-developed and well-nourished.  HENT:  Head: Normocephalic and atraumatic.  Right Ear: External ear normal.  Left Ear: External ear normal.  Nose: Nose normal.  Mouth/Throat: Oropharynx is clear and moist.  TMs and canals are clear.   Eyes: Conjunctivae and EOM are normal. Pupils are equal, round, and reactive to light.  Neck: Neck supple. No thyromegaly present.  Cardiovascular: Normal rate and normal heart sounds.   Pulmonary/Chest: Effort normal and breath sounds normal.  Lymphadenopathy:    He has no cervical adenopathy.  Neurological: He is alert and oriented to person, place, and time.  Skin: Skin is warm and dry.  Psychiatric: He has a normal mood and affect.          Assessment & Plan:  Cough x 3 weeks - Lung exam is clear today. No wheezing on exam. Interestingly his pulse ox is down a little bit to 95%. We'll do peak flows. I would like to consider repeating his chest x-ray. He had one about 3-1/2 weeks ago but he was not having a persistent cough with productive sputum at that time. He  still has been able to exercise and run and has not had any fevers. Still consider infection, versus allergies. We can add a nasal steroid spray to his Claritin and see if this improves his symptoms as well. This could also be caused by reflux but he is taking his PPI regularly. Peak flows in the green zone. He says he actually has a nasal steroid spray home so he will start using it. Also gave him a sample of Ventolin inhaler to use 2 puffs before exercise to see if this at least help control his cough before he works out. Still consider that this could be coming from his ACE inhibitor but he is not convinced.  Atypical left CP likely intercostal muscle strain - will get EKG today.  EKG today sows rate of 70 bpm, NSR, no acute changes. NSAIDs work best for this.  Avoid heaving lifting etc to rest his chest muscle. May be exacerbated by his cough as well.

## 2012-06-24 ENCOUNTER — Encounter: Payer: Self-pay | Admitting: *Deleted

## 2012-07-21 ENCOUNTER — Encounter: Payer: Self-pay | Admitting: Physician Assistant

## 2012-07-21 ENCOUNTER — Ambulatory Visit (INDEPENDENT_AMBULATORY_CARE_PROVIDER_SITE_OTHER): Payer: BC Managed Care – PPO | Admitting: Physician Assistant

## 2012-07-21 VITALS — BP 140/88 | HR 85 | Temp 98.2°F | Wt 226.0 lb

## 2012-07-21 DIAGNOSIS — J029 Acute pharyngitis, unspecified: Secondary | ICD-10-CM

## 2012-07-21 DIAGNOSIS — J019 Acute sinusitis, unspecified: Secondary | ICD-10-CM

## 2012-07-21 MED ORDER — AMOXICILLIN-POT CLAVULANATE 875-125 MG PO TABS: 1.0000 | ORAL_TABLET | Freq: Two times a day (BID) | ORAL | Status: DC

## 2012-07-21 NOTE — Progress Notes (Signed)
  Subjective:    Patient ID: Nicholas Christian, male    DOB: July 20, 1965, 47 y.o.   MRN: [id number]  HPI Patient is a 47 year old male who presents to the clinic with sore throat, bilateral ear pain, sinus pressure for 2 days. Patient reports he ran a 5K on Saturday and was very active but when he woke up on Sunday morning he felt terrible. He has a lot of chest congestion with a productive cough with green sputum. He denies any fever, nausea, vomiting, diarrhea. She's been taking Sudafed and Mucinex. He reports that he's not feeling any better. He is concerned because he has a very busy week at work and would like to try an antibiotic. He denies any wheezing or trouble breathing. He reports that he was seen by Dr. Glade Lloyd in the last month for cough and it never really went away and he is concerned that its worsening now.     Review of Systems     Objective:   Physical Exam  Constitutional: He is oriented to person, place, and time. He appears well-developed and well-nourished.  HENT:  Head: Normocephalic and atraumatic.  Right Ear: External ear normal.  Left Ear: External ear normal.  TMs clear bilaterally.  Oropharynx erythematous with no exudate.  Left-sided maxillary area tender to palpation.  Eyes: Conjunctivae are normal.  Neck: Normal range of motion. Neck supple.  Cardiovascular: Normal rate, regular rhythm and normal heart sounds.   Pulmonary/Chest: Effort normal and breath sounds normal. He has no wheezes.  Lymphadenopathy:    He has no cervical adenopathy.  Neurological: He is alert and oriented to person, place, and time.  Skin: Skin is warm and dry.  Psychiatric: He has a normal mood and affect. His behavior is normal.          Assessment & Plan:  Sinusitis/acute pharyngitis-discuss with patient I think he should continue with Mucinex D., sinus rinses, salt water gargles, honey for the next 48 hours at least. Patient is very resistant to this treatment and feels like  he needs an antibiotic. I did give him a printout of antibiotic and told him the risk versus benefit of using too soon. I offered him a cough suppressant he declined today. If not improving after antibiotic he may followup.

## 2012-07-21 NOTE — Patient Instructions (Addendum)

## 2012-07-24 ENCOUNTER — Other Ambulatory Visit: Payer: Self-pay | Admitting: Family Medicine

## 2012-07-24 NOTE — Telephone Encounter (Signed)
I do not see this on his med list.

## 2012-08-13 ENCOUNTER — Other Ambulatory Visit (INDEPENDENT_AMBULATORY_CARE_PROVIDER_SITE_OTHER): Payer: Self-pay | Admitting: Otolaryngology

## 2012-08-13 DIAGNOSIS — J329 Chronic sinusitis, unspecified: Secondary | ICD-10-CM

## 2012-08-18 ENCOUNTER — Ambulatory Visit
Admission: RE | Admit: 2012-08-18 | Discharge: 2012-08-18 | Disposition: A | Discharge status: Home / Self Care | Payer: BC Managed Care – PPO | Source: Ambulatory Visit | Attending: Otolaryngology | Admitting: Otolaryngology

## 2012-08-18 DIAGNOSIS — J329 Chronic sinusitis, unspecified: Secondary | ICD-10-CM

## 2012-08-21 ENCOUNTER — Other Ambulatory Visit: Payer: Self-pay

## 2012-12-18 ENCOUNTER — Other Ambulatory Visit: Payer: Self-pay

## 2012-12-18 ENCOUNTER — Other Ambulatory Visit: Payer: Self-pay | Admitting: Family Medicine

## 2012-12-19 ENCOUNTER — Ambulatory Visit (INDEPENDENT_AMBULATORY_CARE_PROVIDER_SITE_OTHER): Payer: BC Managed Care – PPO | Admitting: Family Medicine

## 2012-12-19 ENCOUNTER — Encounter: Payer: Self-pay | Admitting: Family Medicine

## 2012-12-19 ENCOUNTER — Other Ambulatory Visit: Payer: Self-pay | Admitting: Family Medicine

## 2012-12-19 VITALS — BP 128/82 | HR 60 | Temp 97.8°F | Wt 233.0 lb

## 2012-12-19 DIAGNOSIS — R1032 Left lower quadrant pain: Secondary | ICD-10-CM

## 2012-12-19 DIAGNOSIS — Z23 Encounter for immunization: Secondary | ICD-10-CM

## 2012-12-19 DIAGNOSIS — M79609 Pain in unspecified limb: Secondary | ICD-10-CM

## 2012-12-19 DIAGNOSIS — R319 Hematuria, unspecified: Secondary | ICD-10-CM

## 2012-12-19 DIAGNOSIS — E119 Type 2 diabetes mellitus without complications: Secondary | ICD-10-CM

## 2012-12-19 DIAGNOSIS — M79604 Pain in right leg: Secondary | ICD-10-CM

## 2012-12-19 LAB — CBC WITH DIFFERENTIAL/PLATELET
Eosinophils Absolute: 0.2 10*3/uL (ref 0.0–0.7)
Eosinophils Relative: 3 % (ref 0–5)
HCT: 45.8 % (ref 39.0–52.0)
Hemoglobin: 15.9 g/dL (ref 13.0–17.0)
Lymphocytes Relative: 40 % (ref 12–46)
Lymphs Abs: 2.4 10*3/uL (ref 0.7–4.0)
MCH: 28.1 pg (ref 26.0–34.0)
MCHC: 34.7 g/dL (ref 30.0–36.0)
MCV: 80.9 fL (ref 78.0–100.0)
Monocytes Absolute: 0.4 10*3/uL (ref 0.1–1.0)
Monocytes Relative: 7 % (ref 3–12)
Platelets: 177 10*3/uL (ref 150–400)
RBC: 5.66 MIL/uL (ref 4.22–5.81)
WBC: 5.9 10*3/uL (ref 4.0–10.5)

## 2012-12-19 LAB — COMPLETE METABOLIC PANEL WITH GFR
ALT: 49 U/L (ref 0–53)
AST: 37 U/L (ref 0–37)
Alkaline Phosphatase: 56 U/L (ref 39–117)
Calcium: 9.4 mg/dL (ref 8.4–10.5)
Chloride: 103 mEq/L (ref 96–112)
Creat: 1.27 mg/dL (ref 0.50–1.35)
Total Protein: 6.6 g/dL (ref 6.0–8.3)

## 2012-12-19 LAB — POCT GLYCOSYLATED HEMOGLOBIN (HGB A1C): Hemoglobin A1C: 6.1

## 2012-12-19 LAB — POCT URINALYSIS DIPSTICK
Bilirubin, UA: NEGATIVE
Glucose, UA: NEGATIVE
Leukocytes, UA: NEGATIVE
Nitrite, UA: NEGATIVE

## 2012-12-19 NOTE — Progress Notes (Signed)
Subjective:    Patient ID: Nicholas Christian, male    DOB: [birth year], 47 y.o.   MRN: [id number]  HPI Right outer lower leg pain when he runs for a couple of months.  Notices about 1 mile into run. Doesn't always happen. When happens he stops, has trouble walking and then last about 10 min. Then can gorun.  Pain 8/10 when happens.  Pain is 2-3 now.   It radiates from just below the knee to the ankle. He denies any injury or trauma. He says there's also a vein near that area towards the posterior calf that bothers him as well.  Has bought new shoes and has worn some compression sock.  Only stretches after he runs.  Worse on uphill.  Says doesn't happen at other times.  No back pain or problems.   About 3 weeks ago had right sided back pain but that resolved and then last week peed blood, bright red.  Happened twice on 2 different days. Wonders if may have had a kdiney stone.  On 12/05/12 he felt like something there when he urinated and says hurt to go to the bathroom. Hx of kidney stones.  He denies any change in bowels. He did notice that is having a little bit of discomfort in the left lower quadrant that started last night. No trauma or injury to the abdomen. No nausea or vomiting. No fevers chills or sweats.  Diabetes-the hypotensive events. No wounds or sores that are not healing well. He can metformin regularly without any side effects or problems. He does take a daily aspirin.  Review of Systems  BP 128/82  Pulse 60  Temp(Src) 97.8 F (36.6 C)  Wt 233 lb (105.688 kg)    Allergies  Allergen Reactions  . Niacin     Past Medical History  Diagnosis Date  . DDD (degenerative disc disease), cervical   . Eosinophilic esophagitis     hx of- [facility]  . High triglycerides   . Low HDL (under 40)   . Diverticulitis   . Low testosterone   . OSA (obstructive sleep apnea)   . Migraines   . Normal cardiac stress test     DOBUTAMINE STRESS ECHO    Past Surgical History  Procedure  Laterality Date  . Nasal sinus surgery      Removal polyps   . Kidney stone surgery      laser at [facility].   . Tonsillectomy and adenoidectomy    . Tympanostomy tube placement    . Esophageal dilation  07/18/11    Dr. Mariana Kaufman  . Edg  01/03/12    History   Social History  . Marital Status: Married    Spouse Name: N/A    Number of Children: 3  . Years of Education: N/A   Occupational History  . Self employed      Hydrographic surveyor.    Social History Main Topics  . Smoking status: Never Smoker   . Smokeless tobacco: Never Used  . Alcohol Use: No  . Drug Use: No  . Sexual Activity: Yes    Partners: Female   Other Topics Concern  . Not on file   Social History Narrative   No regular exercise. 1 caffeine /soda per day.     Family History  Problem Relation Age of Onset  . Diabetes Maternal Grandmother   . Prostate cancer Father     oldr when dx     Outpatient Encounter Prescriptions  as of 12/19/2012  Medication Sig  . AMBULATORY NON FORMULARY MEDICATION Medication Name: Glucometer and strips and lancet to check once a day.  Dx 250.00  . aspirin 81 MG tablet Take 81 mg by mouth daily.    Marland Kitchen lisinopril (PRINIVIL,ZESTRIL) 10 MG tablet TAKE 1 TABLET BY MOUTH DAILY  . metFORMIN (GLUCOPHAGE) 500 MG tablet TAKE 1 TABLET BY MOUTH TWICE DAILY WITH A MEAL  . [DISCONTINUED] amoxicillin-clavulanate (AUGMENTIN) 875-125 MG per tablet Take 1 tablet by mouth 2 (two) times daily. For 10 days.  . [DISCONTINUED] lisinopril (PRINIVIL,ZESTRIL) 10 MG tablet TAKE 1 TABLET BY MOUTH EVERY DAY         Objective:   Physical Exam  Constitutional: He is oriented to person, place, and time.  Abdominal: Soft. Bowel sounds are normal. He exhibits no distension and no mass. There is tenderness. There is no rebound and no guarding.  Tender in the left lower quadrant.  Musculoskeletal:  Knee with NROM.  Nontender over the ankle or just above the ankle which is where most of his pain  is concentrated. He does have some varicose veins over the front of the leg and posteriorly. The one on the back of his calf is a little bit tender today but does not appear to be red or inflamed. Strength with ankle flexion, oxygen, inversion eversion is 5 out of 5. I was unable to recreate his pain on exam.  Neurological: He is alert and oriented to person, place, and time.  Skin: Skin is warm and dry.  Psychiatric: He has a normal mood and affect. His behavior is normal.          Assessment & Plan:  Right lower leg pain-  I. 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 He has Re: tried icing and anti-inflammatories and actually resting it or he has not run for couple weeks to see if it would get better. At this point in time I think really the only way to improve this and treating underlying condition would be to have her evaluated for orthotics. I wonder for him to Dr. Rodney Langton for further evaluation treatment and possible orthotics if he agrees that this would help resolve his symptoms.  Varicose veins-we discussed that if they're causing pain and discomfort the treatment is certainly an option. We can certainly refer him to the vein a vascular specialist for further evaluation and treatment. They are not harmful and gave him reassurance that he said he would hold off at this time. Also recommended compression stockings daily as this helps with discomfort as well. He says he actually her to have apparent home.  Gross hematuria - will check urinalays.  Suspect related to a ureteral stone. But his pain was almost a week before he actually saw blood. Urinalysis today does show trace blood we will send for urine culture just to rule out infection. He is currently without pain today with urination or in the flank. He is having some left lower quadrant pain that. Call if he sees blood again and we'll consider referral to urology. Next  Left lower  quadrant pain-unclear etiology. He has not had any change in bowels, no nausea vomiting or fever. Also encouraged to keep an eye on this and let me know if it's changing or not getting better.  DM- well controlled. He is technically in the impaired fasting glucose range while on metformin. He is on aspirin and ACE inhibitor but not on a statin.  Will discuss further at next visit. Recovered an awful lot of things today and did not get chance to discuss Triggering statins were all diabetics. Lab Results  Component Value Date   HGBA1C 6.1 12/19/2012

## 2012-12-21 LAB — URINE CULTURE
Colony Count: NO GROWTH
Organism ID, Bacteria: NO GROWTH

## 2012-12-22 ENCOUNTER — Ambulatory Visit (INDEPENDENT_AMBULATORY_CARE_PROVIDER_SITE_OTHER): Payer: BC Managed Care – PPO | Admitting: Sports Medicine

## 2012-12-22 ENCOUNTER — Ambulatory Visit (INDEPENDENT_AMBULATORY_CARE_PROVIDER_SITE_OTHER): Payer: BC Managed Care – PPO

## 2012-12-22 ENCOUNTER — Encounter: Payer: Self-pay | Admitting: Sports Medicine

## 2012-12-22 VITALS — BP 142/90 | HR 61 | Wt 233.0 lb

## 2012-12-22 DIAGNOSIS — M79609 Pain in unspecified limb: Secondary | ICD-10-CM

## 2012-12-22 DIAGNOSIS — M79661 Pain in right lower leg: Secondary | ICD-10-CM

## 2012-12-22 MED ORDER — PREDNISONE 50 MG PO TABS: ORAL_TABLET | ORAL | Status: DC

## 2012-12-22 NOTE — Progress Notes (Signed)
  Subjective:    CC: Leg pain  HPI: This is a very pleasant 47 year old male, treated him in the past for hamstring strain as well as metatarsalgia. He comes in with a several week history of pain he localizes on the anterior aspect of his right shin, worse with running uphill. The pain comes on at the same distance every time, and does resolve with rest. He describes it as a numbness over his anterior compartment. He does get some back pain as well. Symptoms are moderate, persistent. He denies any foot drop during these episodes, and is unable to quantify exactly how long they last.  He does not get any symptoms with flexion, does not get any symptoms of Valsalva, or long car rides.  Past medical history, Surgical history, Family history not pertinant except as noted below, Social history, Allergies, and medications have been entered into the medical record, reviewed, and no changes needed.   Review of Systems: No fevers, chills, night sweats, weight loss, chest pain, or shortness of breath.   Objective:    General: Well Developed, well nourished, and in no acute distress.  Neuro: Alert and oriented x3, extra-ocular muscles intact, sensation grossly intact.  HEENT: Normocephalic, atraumatic, pupils equal round reactive to light, neck supple, no masses, no lymphadenopathy, thyroid nonpalpable.  Skin: Warm and dry, no rashes. Cardiac: Regular rate and rhythm, no murmurs rubs or gallops, no lower extremity edema.  Respiratory: Clear to auscultation bilaterally. Not using accessory muscles, speaking in full sentences. Back Exam:  Inspection: Unremarkable  Motion: Flexion 45 deg, Extension 45 deg, Side Bending to 45 deg bilaterally,  Rotation to 45 deg bilaterally  SLR laying: Negative , severe hamstring tightness XSLR laying: Negative  Palpable tenderness: None. FABER: negative. Sensory change: Gross sensation intact to all lumbar and sacral dermatomes.  Reflexes: 2+ at both patellar  tendons, 2+ at achilles tendons, Babinski's downgoing.  Strength at foot  Plantar-flexion: 5/5 Dorsi-flexion: 5/5 Eversion: 5/5 Inversion: 5/5  Leg strength  Quad: 5/5 Hamstring: 5/5 Hip flexor: 5/5 Hip abductors: 5/5  Gait unremarkable. Right Ankle: No visible erythema or swelling. Range of motion is full in all directions. Strength is 5/5 in all directions. Stable lateral and medial ligaments; squeeze test and kleiger test unremarkable; Talar dome nontender; No pain at base of 5th MT; No tenderness over cuboid; No tenderness over N spot or navicular prominence No tenderness on posterior aspects of lateral and medial malleolus No sign of peroneal tendon subluxations or tenderness to palpation Negative tarsal tunnel tinel's Able to walk 4 steps.  I did review his lumbar spine x-rays, they're negative with the exception of very very mild loss of disc height at the L5-S1 level.  Impression and Recommendations:

## 2012-12-22 NOTE — Assessment & Plan Note (Signed)
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 I am going to treat him for both, he will come back for possible orthotics which have been shown to improve symptoms and exertional compartment syndrome. Also going to give prednisone, some x-rays of his lumbar spine, and do formal physical therapy. His hamstrings are extremely tight, and hopefully this can be worked out with PT. I would like him to keep his lower legs strapped with compressive dressing while running. I strapped them today. When he comes back, if he is no better we can certainly pursue pre-and post exercise compartment pressure testing.

## 2012-12-22 NOTE — Patient Instructions (Signed)
Compartment Syndrome, Exertional Exertional compartment syndrome is a painful problem that can happen over and over. The arms and legs have different compartments. Each compartment is made up of muscles, nerves, and blood vessels. Each compartment is enclosed by a layer of tissue called the fascia. The fascia can stretch but only a little. During exertion, the muscle swells due to increase blood flow. This may increase the pressure within the fascia, compressing the nerves and blood vessels. This leads to pain, numbness, or weakness. The pain starts while exercising and is relieved by rest. Sometimes, the pain can be severe and may even restrict activities. Usually, both legs are affected, and it almost always affects the lower legs. Rare cases may affect thighs or arms. Men and women are equally affected. It mostly affects athletes who are in very good shape and are less than age 55.  CAUSES   Repetitive activities that take a lot of effort.  Doing demanding activities or exercises without any conditioning.  Training level that is quickly increased. SYMPTOMS   Pain and swelling.  Aches and cramps after demanding activity.  Feeling of tightness.  Tingling, burning, or numbness.  Weakness.  Any or all of the above coming on with exercise and going away with rest. DIAGNOSIS  Your caregiver may suspect the problem based on how you describe the pain. The diagnosis is made by using a special device that measures the pressure in the affected area both during rest and exercise. Blood tests and special X-rays may be done to help rule out other problems, but they do not diagnose compartment syndrome.  TREATMENT  The symptoms may be reduced by stopping the activity that starts the pain. Other treatments include:  Applying ice to the affected area.  Elevating the limb(s).  Physical therapy.  Massage.  Pain medications.  Increased cushioning in the shoes. Unfortunately, these methods may  not be effective in controlling the symptoms. Athletes do not want to stop exercise. Surgery is the only effective treatment for relief of recurrent pain. It involves the surgeon cutting (incision) in the fascia to release the pressure in the compartment. HOME CARE INSTRUCTIONS   Warm up and stretch properly before starting any exercise.  Cool down after you complete your exercise.  Stop activity if it causes pain.  Talk to your caregiver before you start any new exercise program.  Eat a balanced and healthy diet.  Drink lots of liquids.  Try other low impact exercises, such as swimming and cycling.  Do not wrap the affected area.  Use proper fitting shoes. SEEK MEDICAL CARE IF:   Your symptoms come back (recur) or worsen even with treatment.  Pain continues despite rest and avoiding exercise. MAKE SURE YOU:   Understand these instructions.  Will watch your condition.  Will get help right away if you are not doing well or get worse. Document Released: 05/08/2007 Document Revised: 04/23/2011 Document Reviewed: 09/10/2012 [facility] Patient Information 2014 [place], [place].

## 2012-12-26 ENCOUNTER — Ambulatory Visit (INDEPENDENT_AMBULATORY_CARE_PROVIDER_SITE_OTHER): Payer: BC Managed Care – PPO | Admitting: Sports Medicine

## 2012-12-26 ENCOUNTER — Encounter: Payer: Self-pay | Admitting: Sports Medicine

## 2012-12-26 VITALS — BP 129/85 | HR 57 | Wt 237.0 lb

## 2012-12-26 DIAGNOSIS — M79609 Pain in unspecified limb: Secondary | ICD-10-CM

## 2012-12-26 DIAGNOSIS — M79661 Pain in right lower leg: Secondary | ICD-10-CM

## 2012-12-26 NOTE — Assessment & Plan Note (Signed)
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 Custom orthotics as above. Prednisone was not helpful and lumbar spine x-rays were essentially negative. I would like to see him back after a good 3-4 weeks her formal physical therapy, and if he continues to have symptoms we can proceed with exertional compartment pressure testing.

## 2012-12-26 NOTE — Progress Notes (Signed)
    Patient was fitted for a : standard, cushioned, semi-rigid orthotic. The orthotic was heated and afterward the patient stood on the orthotic blank positioned on the orthotic stand. The patient was positioned in subtalar neutral position and 10 degrees of ankle dorsiflexion in a weight bearing stance. After completion of molding, a stable base was applied to the orthotic blank. The blank was ground to a stable position for weight bearing. Size:10 Base: Blue EVA Additional Posting and Padding: None The patient ambulated these, and they were very comfortable.  I spent 40 minutes with this patient, greater than 50% was face-to-face time counseling regarding the below diagnosis.   

## 2012-12-31 ENCOUNTER — Ambulatory Visit: Payer: BC Managed Care – PPO | Admitting: Physical Therapy

## 2012-12-31 DIAGNOSIS — M79609 Pain in unspecified limb: Secondary | ICD-10-CM

## 2012-12-31 DIAGNOSIS — M25676 Stiffness of unspecified foot, not elsewhere classified: Secondary | ICD-10-CM

## 2012-12-31 DIAGNOSIS — M25673 Stiffness of unspecified ankle, not elsewhere classified: Secondary | ICD-10-CM

## 2012-12-31 DIAGNOSIS — M25659 Stiffness of unspecified hip, not elsewhere classified: Secondary | ICD-10-CM

## 2013-01-06 ENCOUNTER — Encounter: Payer: BC Managed Care – PPO | Admitting: Physical Therapy

## 2013-01-16 ENCOUNTER — Encounter: Payer: Self-pay | Admitting: Sports Medicine

## 2013-01-16 ENCOUNTER — Ambulatory Visit: Payer: BC Managed Care – PPO | Admitting: Sports Medicine

## 2013-02-10 ENCOUNTER — Other Ambulatory Visit: Payer: Self-pay | Admitting: Family Medicine

## 2013-04-28 ENCOUNTER — Other Ambulatory Visit: Payer: Self-pay | Admitting: Family Medicine

## 2013-06-16 LAB — HM DIABETES EYE EXAM

## 2013-07-20 ENCOUNTER — Other Ambulatory Visit: Payer: Self-pay | Admitting: Family Medicine

## 2013-07-27 ENCOUNTER — Encounter: Payer: Self-pay | Admitting: Emergency Medicine

## 2013-07-27 ENCOUNTER — Emergency Department (INDEPENDENT_AMBULATORY_CARE_PROVIDER_SITE_OTHER): Payer: BC Managed Care – PPO

## 2013-07-27 ENCOUNTER — Ambulatory Visit (INDEPENDENT_AMBULATORY_CARE_PROVIDER_SITE_OTHER): Payer: BC Managed Care – PPO | Admitting: Sports Medicine

## 2013-07-27 ENCOUNTER — Emergency Department
Admission: EM | Admit: 2013-07-27 | Discharge: 2013-07-27 | Disposition: A | Discharge status: Home / Self Care | Payer: BC Managed Care – PPO | Source: Home / Self Care | Attending: Family Medicine | Admitting: Family Medicine

## 2013-07-27 DIAGNOSIS — I517 Cardiomegaly: Secondary | ICD-10-CM

## 2013-07-27 DIAGNOSIS — IMO0002 Reserved for concepts with insufficient information to code with codable children: Secondary | ICD-10-CM

## 2013-07-27 DIAGNOSIS — R1032 Left lower quadrant pain: Secondary | ICD-10-CM

## 2013-07-27 DIAGNOSIS — M940 Chondrocostal junction syndrome [Tietze]: Secondary | ICD-10-CM

## 2013-07-27 DIAGNOSIS — S39011A Strain of muscle, fascia and tendon of abdomen, initial encounter: Secondary | ICD-10-CM | POA: Insufficient documentation

## 2013-07-27 MED ORDER — MELOXICAM 15 MG PO TABS: ORAL_TABLET | ORAL | Status: DC

## 2013-07-27 NOTE — Discharge Instructions (Signed)
Chest Wall Pain Chest wall pain is pain felt in or around the chest bones and muscles. It may take up to 6 weeks to get better. It may take longer if you are active. Chest wall pain can happen on its own. Other times, things like germs, injury, coughing, or exercise can cause the pain. HOME CARE   Avoid activities that make you tired or cause pain. Try not to use your chest, belly (abdominal), or side muscles. Do not use heavy weights.  Put ice on the sore area.  Put ice in a plastic bag.  Place a towel between your skin and the bag.  Leave the ice on for 15-20 minutes for the first 2 days.  Only take medicine as told by your doctor. GET HELP RIGHT AWAY IF:   You have more pain or are very uncomfortable.  You have a fever.  Your chest pain gets worse.  You have new problems.  You feel sick to your stomach (nauseous) or throw up (vomit).  You start to sweat or feel lightheaded.  You have a cough with mucus (phlegm).  You cough up blood. MAKE SURE YOU:   Understand these instructions.  Will watch your condition.  Will get help right away if you are not doing well or get worse. Document Released: 07/18/2007 Document Revised: 04/23/2011 Document Reviewed: 09/25/2010 [facility] Patient Information 2014 [place], [place].   Abdominal Pain, Adult Many things can cause belly (abdominal) pain. Most times, the belly pain is not dangerous. Many cases of belly pain can be watched and treated at home. HOME CARE   Do not take medicines that help you go poop (laxatives) unless told to by your doctor.  Only take medicine as told by your doctor.  Eat or drink as told by your doctor. Your doctor will tell you if you should be on a special diet. GET HELP IF:  You do not know what is causing your belly pain.  You have belly pain while you are sick to your stomach (nauseous) or have runny poop (diarrhea).  You have pain while you pee or poop.  Your belly pain wakes you up at  night.  You have belly pain that gets worse or better when you eat.  You have belly pain that gets worse when you eat fatty foods. GET HELP RIGHT AWAY IF:   The pain does not go away within 2 hours.  You have a fever.  You keep throwing up (vomiting).  The pain changes and is only in the right or left part of the belly.  You have bloody or tarry looking poop. MAKE SURE YOU:   Understand these instructions.  Will watch your condition.  Will get help right away if you are not doing well or get worse. Document Released: 07/18/2007 Document Revised: 11/19/2012 Document Reviewed: 10/08/2012 [facility] Patient Information 2014 [place].

## 2013-07-27 NOTE — Assessment & Plan Note (Signed)
Mobic, relative rest. If no better in 2 weeks we will probably do a CT scan of his belly.

## 2013-07-27 NOTE — Assessment & Plan Note (Signed)
Mobic, return in 2 weeks. No clinical signs or symptoms of pulmonary embolism or acute coronary syndrome

## 2013-07-27 NOTE — ED Provider Notes (Signed)
CSN: [id number]     Arrival date & time 07/27/13  0941 History   First MD Initiated Contact with Patient 07/27/13 1028     Chief Complaint  Patient presents with  . Chest Pain      HPI Comments: Patient reports that about 3 weeks ago he developed a cold, with cough and congestion.  Several days later he developed sharp pain in his left anterior/inferior chest worse with inspiration, movement, and coughing.  His cold has now resolved.  No fevers, chills, and sweats, and no shortness of breath. He also complains of a longer history of pain in his left lower abdomen that is most noticeable when doing a sit-up, and for about 30 minutes initially when he begins running.  He recalls no recent injury or significant change in activities.  No significant change in bowel function.  He feels well otherwise.  Patient is a 48 y.o. male presenting with chest pain and abdominal pain. The history is provided by the patient.  Chest Pain Pain location:  L chest Pain quality: sharp   Pain quality: not radiating   Pain radiates to:  Does not radiate Pain severity:  Mild Onset quality:  Gradual Duration:  2 weeks Timing:  Intermittent Progression:  Unchanged Chronicity:  New Context: breathing   Relieved by:  Certain positions Worsened by:  Coughing and deep breathing Ineffective treatments:  None tried Associated symptoms: abdominal pain   Associated symptoms: no back pain, no cough, no diaphoresis, no dysphagia, no fatigue, no fever, no heartburn, no lower extremity edema, no nausea, no palpitations, no shortness of breath and not vomiting   Risk factors: diabetes mellitus and obesity   Abdominal Pain This is a new problem. Episode onset: several weeks ago. The problem occurs daily. The problem has not changed since onset.Associated symptoms include chest pain and abdominal pain. Pertinent negatives include no shortness of breath. Exacerbated by: sitting up from supine position and running. Nothing  relieves the symptoms. He has tried nothing for the symptoms.    Past Medical History  Diagnosis Date  . DDD (degenerative disc disease), cervical   . Eosinophilic esophagitis     hx of- [facility]  . High triglycerides   . Low HDL (under 40)   . Diverticulitis   . Low testosterone   . OSA (obstructive sleep apnea)   . Migraines   . Normal cardiac stress test     DOBUTAMINE STRESS ECHO   Past Surgical History  Procedure Laterality Date  . Nasal sinus surgery      Removal polyps   . Kidney stone surgery      las[facility] at [facility].   . Tonsillectomy and adenoidectomy    . Tympanostomy tube placement    . Esophageal dilation  07/18/11    Dr. Tora Duck  . Edg  01/03/12   Family History  Problem Relation Age of Onset  . Diabetes Maternal Grandmother   . Prostate cancer Father     oldr when dx    History  Substance Use Topics  . Smoking status: Never Smoker   . Smokeless tobacco: Never Used  . Alcohol Use: No    Review of Systems  Constitutional: Negative for fever, diaphoresis and fatigue.  HENT: Negative for trouble swallowing.   Respiratory: Negative for cough and shortness of breath.   Cardiovascular: Positive for chest pain. Negative for palpitations.  Gastrointestinal: Positive for abdominal pain. Negative for heartburn, nausea and vomiting.  Musculoskeletal: Negative for back pain.  Allergies  Niacin  Home Medications   Prior to Admission medications   Medication Sig Start Date End Date Taking? Authorizing Provider  loratadine (CLARITIN REDITABS) 10 MG dissolvable tablet Take 10 mg by mouth daily.   Yes Historical Provider, MD  AMBULATORY NON FORMULARY MEDICATION Medication Name: Glucometer and strips and lancet to check once a day.  Dx 250.00 04/10/11   Hali Marry, MD  aspirin 81 MG tablet Take 81 mg by mouth daily.      Historical Provider, MD  lisinopril (PRINIVIL,ZESTRIL) 10 MG tablet TAKE 1 TABLET BY MOUTH DAILY    Hali Marry, MD    metFORMIN (GLUCOPHAGE) 500 MG tablet TAKE 1 TABLET BY MOUTH TWICE DAILY WITH MEAL    Hali Marry, MD  NEXIUM 40 MG capsule  11/06/12   Historical Provider, MD   BP 120/79  Pulse 85  Temp(Src) 98.1 F (36.7 C) (Oral)  Resp 16  Ht 5\' 11"  (1.803 m)  Wt 244 lb (110.678 kg)  BMI 34.05 kg/m2  SpO2 96% Physical Exam  Nursing note and vitals reviewed. Constitutional: He is oriented to person, place, and time. He appears well-developed and well-nourished. No distress.  Patient is obese (BMI 34.1)  HENT:  Head: Normocephalic.  Mouth/Throat: Oropharynx is clear and moist.  Eyes: Conjunctivae and EOM are normal. Pupils are equal, round, and reactive to light.  Neck: Neck supple.  Cardiovascular: Normal heart sounds.   Pulmonary/Chest: Breath sounds normal. He exhibits tenderness.    Left inferior costal margin near sternum has distinct tenderness to palpation, as noted on diagram.    Abdominal: He exhibits no mass. There is no hepatosplenomegaly. There is tenderness in the left lower quadrant. There is no rebound and no guarding. Hernia confirmed negative in the right inguinal area and confirmed negative in the left inguinal area.    Left lower abdomen has distinct tenderness to deep palpation with contraction of the rectus abdominus muscle.  No hernia palpated.  Genitourinary: Right testis shows no swelling and no tenderness. Left testis shows no swelling and no tenderness. No penile tenderness.  Musculoskeletal: He exhibits tenderness. He exhibits no edema.  Lymphadenopathy:    He has no cervical adenopathy.       Right: No inguinal adenopathy present.       Left: No inguinal adenopathy present.  Neurological: He is alert and oriented to person, place, and time.    ED Course  Procedures  none     Imaging Review Dg Ribs Unilateral W/chest Left  07/27/2013   CLINICAL DATA:  Left lower anterior rib pain for 2 weeks.  EXAM: LEFT RIBS AND CHEST - 3+ VIEW  COMPARISON:   06/20/2012  FINDINGS: No pneumothorax or pneumomediastinum. Mild cardiomegaly, without edema.  The BB marker is over the cartilaginous portion of the left ribs. No adjacent bony fracture is visible.  IMPRESSION: 1. No rib fracture identified. The BB marker indicating the patient's point of pain is over costal cartilage. Please note that nondisplaced rib fractures can be occult on conventional radiography. 2. Chronic mild cardiomegaly.   Electronically Signed   By: Sherryl Barters M.D.   On: 07/27/2013 11:11     MDM   1. Costochondritis   2. Abdominal pain, left lower quadrant; suspect musculoskeletal:  ?rectus abdominal strain, ?hip flexor strain.  Abdominal hernia a remote possibilitiy.     Will refer to Dr. Aundria Mems for further evaluation.    Kandra Nicolas, MD 07/27/13 1256

## 2013-07-27 NOTE — Progress Notes (Signed)
   Subjective:    I'm seeing this patient as a consultation for:  Dr. Assunta Found  CC: Left-sided pain  HPI: Left-sided chest wall pain: Present for 2 weeks, moderate, persistent, no trauma, hurts to breathe deeply, very tender to palpation. No shortness of breath, no recent long car drives. Chest x-ray obtained by urgent care physician was negative.  Left-sided lower abdominal pain: Moderate, persistent, hurts in the anterior abdominal wall, worse when trying to do a sit up or lay down on his back. No GI symptoms. No constitutional symptoms. No trauma.  Past medical history, Surgical history, Family history not pertinant except as noted below, Social history, Allergies, and medications have been entered into the medical record, reviewed, and no changes needed.   Review of Systems: No headache, visual changes, nausea, vomiting, diarrhea, constipation, dizziness, abdominal pain, skin rash, fevers, chills, night sweats, weight loss, swollen lymph nodes, body aches, joint swelling, muscle aches, chest pain, shortness of breath, mood changes, visual or auditory hallucinations.   Objective:   General: Well Developed, well nourished, and in no acute distress.  Neuro/Psych: Alert and oriented x3, extra-ocular muscles intact, able to move all 4 extremities, sensation grossly intact. Skin: Warm and dry, no rashes noted.  Respiratory: Not using accessory muscles, speaking in full sentences, trachea midline. Tender to palpation along the left costal margin. Cardiovascular: Pulses palpable, no extremity edema. Abdomen: Does not appear distended. There is tenderness to palpation along the left-sided lower rectus abdominous, and along the linear semilunaris. I do not feel any defects in the abdominal wall, and I do not feel any bulges with Valsalva. Extremities: Negative Homans sign.  Impression and Recommendations:   This case required medical decision making of moderate complexity.

## 2013-07-27 NOTE — ED Notes (Signed)
Reports onset of rib pain in upper/lateral/left rib area 2 weeks ago after URI; intermittent; worse upon certain movements and deep inspiration/sneezing.

## 2013-07-30 ENCOUNTER — Telehealth: Payer: Self-pay | Admitting: Emergency Medicine

## 2013-07-30 NOTE — ED Notes (Signed)
Patient is being followed by Dr.Thekkakandum.

## 2013-08-20 ENCOUNTER — Other Ambulatory Visit: Payer: Self-pay | Admitting: Family Medicine

## 2013-10-14 ENCOUNTER — Other Ambulatory Visit: Payer: Self-pay | Admitting: Family Medicine

## 2013-10-20 ENCOUNTER — Emergency Department (INDEPENDENT_AMBULATORY_CARE_PROVIDER_SITE_OTHER): Payer: BC Managed Care – PPO

## 2013-10-20 ENCOUNTER — Emergency Department
Admission: EM | Admit: 2013-10-20 | Discharge: 2013-10-20 | Disposition: A | Discharge status: Home / Self Care | Payer: BC Managed Care – PPO | Source: Home / Self Care

## 2013-10-20 ENCOUNTER — Encounter: Payer: Self-pay | Admitting: Emergency Medicine

## 2013-10-20 DIAGNOSIS — M545 Low back pain, unspecified: Secondary | ICD-10-CM

## 2013-10-20 DIAGNOSIS — R079 Chest pain, unspecified: Secondary | ICD-10-CM

## 2013-10-20 DIAGNOSIS — R0789 Other chest pain: Secondary | ICD-10-CM

## 2013-10-20 DIAGNOSIS — M62830 Muscle spasm of back: Secondary | ICD-10-CM

## 2013-10-20 MED ORDER — KETOROLAC TROMETHAMINE 60 MG/2ML IM SOLN
60.0000 mg | Freq: Once | INTRAMUSCULAR | Status: AC
  Administered 2013-10-20: 60 mg via INTRAMUSCULAR

## 2013-10-20 MED ORDER — HYDROCODONE-ACETAMINOPHEN 5-325 MG PO TABS: 1.0000 | ORAL_TABLET | Freq: Three times a day (TID) | ORAL | Status: DC | PRN

## 2013-10-20 MED ORDER — CYCLOBENZAPRINE HCL 10 MG PO TABS: ORAL_TABLET | ORAL | Status: DC

## 2013-10-20 MED ORDER — PREDNISONE 50 MG PO TABS: 50.0000 mg | ORAL_TABLET | Freq: Every day | ORAL | Status: DC

## 2013-10-20 NOTE — ED Provider Notes (Signed)
CSN: [id number]     Arrival date & time 10/20/13  1339 History   None    Chief Complaint  Patient presents with  . Back Pain  . Tailbone Pain   (Consider location/radiation/quality/duration/timing/severity/associated sxs/prior Treatment) HPI Pt is a 47 yo male who presents to the clinic with ongoing left chest wall pain that radiates to the back and new tailbone pain.   He was seen 6/15 by Dr. Assunta Found and Dr. Sophronia Simas who dx with costochondritis. 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 Worse when driving or moving left arm. Better with ice and heat. Has been to chiropractor with no relief. Describes with pain as constant but worse at times to 6/10. No SOB or chest pain. No cough, fever, or wheezing.   Tailbone pain started about 1 day ago after jumping off back of truck. Noticed pain this morning. Ibuprofen does seem to help. Pain of coccyx 4/10. No radiation or numbness or tingling of extremities.   Past Medical History  Diagnosis Date  . DDD (degenerative disc disease), cervical   . Eosinophilic esophagitis     hx of- [facility]  . High triglycerides   . Low HDL (under 40)   . Diverticulitis   . Low testosterone   . OSA (obstructive sleep apnea)   . Migraines   . Normal cardiac stress test     DOBUTAMINE STRESS ECHO   Past Surgical History  Procedure Laterality Date  . Nasal sinus surgery      Removal polyps   . Kidney stone surgery      laser at [facility].   . Tonsillectomy and adenoidectomy    . Tympanostomy tube placement    . Esophageal dilation  07/18/11    Dr. Tora Duck  . Edg  01/03/12   Family History  Problem Relation Age of Onset  . Diabetes Maternal Grandmother   . Prostate cancer Father     oldr when dx    History  Substance Use Topics  . Smoking status: Never Smoker   . Smokeless tobacco: Never Used  . Alcohol Use: No    Review of Systems  All other systems reviewed and are  negative.   Allergies  Niacin  Home Medications   Prior to Admission medications   Medication Sig Start Date End Date Taking? Authorizing Provider  AMBULATORY NON FORMULARY MEDICATION Medication Name: Glucometer and strips and lancet to check once a day.  Dx 250.00 04/10/11   Hali Marry, MD  aspirin 81 MG tablet Take 81 mg by mouth daily.      Historical Provider, MD  cyclobenzaprine (FLEXERIL) 10 MG tablet One half tab PO qHS, then increase gradually to one tab TID. 10/20/13   Donella Stade, PA-C  HYDROcodone-acetaminophen (NORCO/VICODIN) 5-325 MG per tablet Take 1 tablet by mouth every 8 (eight) hours as needed for moderate pain. 10/20/13   Jesselee Poth L Sheneika Walstad, PA-C  lisinopril (PRINIVIL,ZESTRIL) 10 MG tablet TAKE 1 TABLET BY MOUTH DAILY 10/15/13   Hali Marry, MD  loratadine (CLARITIN REDITABS) 10 MG dissolvable tablet Take 10 mg by mouth daily.    Historical Provider, MD  meloxicam (MOBIC) 15 MG tablet One tab PO qAM with breakfast for 2 weeks, then daily prn pain. 07/27/13   Silverio Decamp, MD  metFORMIN (GLUCOPHAGE) 500 MG tablet TAKE 1 TABLET BY MOUTH TWICE DAILY WITH MEAL    Hali Marry, MD  NEXIUM 40 MG capsule  11/06/12   Historical Provider, MD  predniSONE (DELTASONE) 50 MG tablet Take 1 tablet (50 mg total) by mouth daily. 10/20/13   Moiz Ryant L Kramer Hanrahan, PA-C   BP 140/92  Pulse 87  Temp(Src) 98.4 F (36.9 C) (Oral)  Ht 5\' 11"  (1.803 m)  Wt 244 lb (110.678 kg)  BMI 34.05 kg/m2  SpO2 97% Physical Exam  Constitutional: He appears well-developed and well-nourished.  HENT:  Head: Normocephalic and atraumatic.  Cardiovascular: Normal rate, regular rhythm and normal heart sounds.   Pulmonary/Chest: Effort normal and breath sounds normal.  Musculoskeletal:  Pain around sacrum and coccyx to palpation.  Negative straight leg test.  Discomfort with palpation along left chest wall around left pectoralis muscle and into rhomboid and trapezius muscles of the  back.  ROm of left shoulder normal.   Skin: Skin is dry.  Psychiatric: He has a normal mood and affect. His behavior is normal.    ED Course  Procedures (including critical care time) Labs Review Labs Reviewed - No data to display  Imaging Review Dg Ribs Unilateral W/chest Left  10/20/2013   CLINICAL DATA:  Chest wall pain.  No injury.  EXAM: LEFT RIBS AND CHEST - 3+ VIEW  COMPARISON:  07/27/2013  FINDINGS: No rib fracture or rib lesion.  Heart, mediastinum hila are unremarkable. Lungs are clear. No pleural effusion or pneumothorax.  IMPRESSION: Negative.   Electronically Signed   By: Lajean Manes M.D.   On: 10/20/2013 14:47   Dg Lumbar Spine Complete  10/20/2013   CLINICAL DATA:  Low back pain.  EXAM: LUMBAR SPINE - COMPLETE 4+ VIEW  COMPARISON:  12/22/2012  FINDINGS: There is no evidence of lumbar spine fracture. Alignment is normal. Intervertebral disc spaces are maintained. No disc space narrowing. No bone destruction. No subluxation.  IMPRESSION: : IMPRESSION:  Normal exam, unchanged.   Electronically Signed   By: Rozetta Nunnery M.D.   On: 10/20/2013 14:52     MDM   1. Midline low back pain without sciatica   2. Left-sided chest wall pain   3. Muscle spasm of back    Imaging was negative for any acute process.  Reassured pt that pain seems to be musculoskelatal in the chest wall and likely to do inflammation from jumping off truck in coccyx area.  toradol 60mg  IM given today.  prednison burst given.  Flexeril up to TID as needed. Sedation warning given.  mobic daily and stop ibuprofen.  Alternate heat and ice.  Exercises given today to start.  Follow up with PCP.  norco for acute pain as needed.      Donella Stade, PA-C 10/20/13 1714

## 2013-10-20 NOTE — ED Notes (Signed)
Returns to exam room.

## 2013-10-20 NOTE — ED Notes (Signed)
Patient transported to X-ray 

## 2013-10-20 NOTE — ED Notes (Signed)
Pt c/o ongoing pain in left upper back and chest. Seen previously by Dr. Assunta Found and consult with Dr. Dianah Field. No improvement. Last night started having coccyx pain without injury.

## 2013-10-20 NOTE — Discharge Instructions (Signed)
Continue mobic daily.  Toradol given today.  Prednisone burst given for 5 days.  Flexeril given to relax muscles.  Encouraged heat and ice alternation.  Exercise for low back given.  norco for acute pain as needed.   Low Back Sprain with Rehab  A sprain is an injury in which a ligament is torn. The ligaments of the lower back are vulnerable to sprains. However, they are strong and require great force to be injured. These ligaments are important for stabilizing the spinal column. Sprains are classified into three categories. Grade 1 sprains cause pain, but the tendon is not lengthened. Grade 2 sprains include a lengthened ligament, due to the ligament being stretched or partially ruptured. With grade 2 sprains there is still function, although the function may be decreased. Grade 3 sprains involve a complete tear of the tendon or muscle, and function is usually impaired. SYMPTOMS   Severe pain in the lower back.  Sometimes, a feeling of a "pop," "snap," or tear, at the time of injury.  Tenderness and sometimes swelling at the injury site.  Uncommonly, bruising (contusion) within 48 hours of injury.  Muscle spasms in the back. CAUSES  Low back sprains occur when a force is placed on the ligaments that is greater than they can handle. Common causes of injury include:  Performing a stressful act while off-balance.  Repetitive stressful activities that involve movement of the lower back.  Direct hit (trauma) to the lower back. RISK INCREASES WITH:  Contact sports (football, wrestling).  Collisions (major skiing accidents).  Sports that require throwing or lifting (baseball, weightlifting).  Sports involving twisting of the spine (gymnastics, diving, tennis, golf).  Poor strength and flexibility.  Inadequate protection.  Previous back injury or surgery (especially fusion). PREVENTION  Wear properly fitted and padded protective equipment.  Warm up and stretch properly before  activity.  Allow for adequate recovery between workouts.  Maintain physical fitness:  Strength, flexibility, and endurance.  Cardiovascular fitness.  Maintain a healthy body weight. PROGNOSIS  If treated properly, low back sprains usually heal with non-surgical treatment. The length of time for healing depends on the severity of the injury.  RELATED COMPLICATIONS   Recurring symptoms, resulting in a chronic problem.  Chronic inflammation and pain in the low back.  Delayed healing or resolution of symptoms, especially if activity is resumed too soon.  Prolonged impairment.  Unstable or arthritic joints of the low back. TREATMENT  Treatment first involves the use of ice and medicine, to reduce pain and inflammation. The use of strengthening and stretching exercises may help reduce pain with activity. These exercises may be performed at home or with a therapist. Severe injuries may require referral to a therapist for further evaluation and treatment, such as ultrasound. Your caregiver may advise that you wear a back brace or corset, to help reduce pain and discomfort. Often, prolonged bed rest results in greater harm then benefit. Corticosteroid injections may be recommended. However, these should be reserved for the most serious cases. It is important to avoid using your back when lifting objects. At night, sleep on your back on a firm mattress, with a pillow placed under your knees. If non-surgical treatment is unsuccessful, surgery may be needed.  MEDICATION   If pain medicine is needed, nonsteroidal anti-inflammatory medicines (aspirin and ibuprofen), or other minor pain relievers (acetaminophen), are often advised.  Do not take pain medicine for 7 days before surgery.  Prescription pain relievers may be given, if your caregiver thinks they  are needed. Use only as directed and only as much as you need.  Ointments applied to the skin may be helpful.  Corticosteroid injections may  be given by your caregiver. These injections should be reserved for the most serious cases, because they may only be given a certain number of times. HEAT AND COLD  Cold treatment (icing) should be applied for 10 to 15 minutes every 2 to 3 hours for inflammation and pain, and immediately after activity that aggravates your symptoms. Use ice packs or an ice massage.  Heat treatment may be used before performing stretching and strengthening activities prescribed by your caregiver, physical therapist, or athletic trainer. Use a heat pack or a warm water soak. SEEK MEDICAL CARE IF:   Symptoms get worse or do not improve in 2 to 4 weeks, despite treatment.  You develop numbness or weakness in either leg.  You lose bowel or bladder function.  Any of the following occur after surgery: fever, increased pain, swelling, redness, drainage of fluids, or bleeding in the affected area.  New, unexplained symptoms develop. (Drugs used in treatment may produce side effects.) EXERCISES  RANGE OF MOTION (ROM) AND STRETCHING EXERCISES - Low Back Sprain Most people with lower back pain will find that their symptoms get worse with excessive bending forward (flexion) or arching at the lower back (extension). The exercises that will help resolve your symptoms will focus on the opposite motion.  Your physician, physical therapist or athletic trainer will help you determine which exercises will be most helpful to resolve your lower back pain. Do not complete any exercises without first consulting with your caregiver. Discontinue any exercises which make your symptoms worse, until you speak to your caregiver. If you have pain, numbness or tingling which travels down into your buttocks, leg or foot, the goal of the therapy is for these symptoms to move closer to your back and eventually resolve. Sometimes, these leg symptoms will get better, but your lower back pain may worsen. This is often an indication of progress in  your rehabilitation. Be very alert to any changes in your symptoms and the activities in which you participated in the 24 hours prior to the change. Sharing this information with your caregiver will allow him or her to most efficiently treat your condition. These exercises may help you when beginning to rehabilitate your injury. Your symptoms may resolve with or without further involvement from your physician, physical therapist or athletic trainer. While completing these exercises, remember:   Restoring tissue flexibility helps normal motion to return to the joints. This allows healthier, less painful movement and activity.  An effective stretch should be held for at least 30 seconds.  A stretch should never be painful. You should only feel a gentle lengthening or release in the stretched tissue. FLEXION RANGE OF MOTION AND STRETCHING EXERCISES: STRETCH - Flexion, Single Knee to Chest   Lie on a firm bed or floor with both legs extended in front of you.  Keeping one leg in contact with the floor, bring your opposite knee to your chest. Hold your leg in place by either grabbing behind your thigh or at your knee.  Pull until you feel a gentle stretch in your low back. Hold __________ seconds.  Slowly release your grasp and repeat the exercise with the opposite side. Repeat __________ times. Complete this exercise __________ times per day.  STRETCH - Flexion, Double Knee to Chest  Lie on a firm bed or floor with both legs extended  in front of you.  Keeping one leg in contact with the floor, bring your opposite knee to your chest.  Tense your stomach muscles to support your back and then lift your other knee to your chest. Hold your legs in place by either grabbing behind your thighs or at your knees.  Pull both knees toward your chest until you feel a gentle stretch in your low back. Hold __________ seconds.  Tense your stomach muscles and slowly return one leg at a time to the  floor. Repeat __________ times. Complete this exercise __________ times per day.  STRETCH - Low Trunk Rotation  Lie on a firm bed or floor. Keeping your legs in front of you, bend your knees so they are both pointed toward the ceiling and your feet are flat on the floor.  Extend your arms out to the side. This will stabilize your upper body by keeping your shoulders in contact with the floor.  Gently and slowly drop both knees together to one side until you feel a gentle stretch in your low back. Hold for __________ seconds.  Tense your stomach muscles to support your lower back as you bring your knees back to the starting position. Repeat the exercise to the other side. Repeat __________ times. Complete this exercise __________ times per day  EXTENSION RANGE OF MOTION AND FLEXIBILITY EXERCISES: STRETCH - Extension, Prone on Elbows   Lie on your stomach on the floor, a bed will be too soft. Place your palms about shoulder width apart and at the height of your head.  Place your elbows under your shoulders. If this is too painful, stack pillows under your chest.  Allow your body to relax so that your hips drop lower and make contact more completely with the floor.  Hold this position for __________ seconds.  Slowly return to lying flat on the floor. Repeat __________ times. Complete this exercise __________ times per day.  RANGE OF MOTION - Extension, Prone Press Ups  Lie on your stomach on the floor, a bed will be too soft. Place your palms about shoulder width apart and at the height of your head.  Keeping your back as relaxed as possible, slowly straighten your elbows while keeping your hips on the floor. You may adjust the placement of your hands to maximize your comfort. As you gain motion, your hands will come more underneath your shoulders.  Hold this position __________ seconds.  Slowly return to lying flat on the floor. Repeat __________ times. Complete this exercise  __________ times per day.  RANGE OF MOTION- Quadruped, Neutral Spine   Assume a hands and knees position on a firm surface. Keep your hands under your shoulders and your knees under your hips. You may place padding under your knees for comfort.  Drop your head and point your tailbone toward the ground below you. This will round out your lower back like an angry cat. Hold this position for __________ seconds.  Slowly lift your head and release your tail bone so that your back sags into a large arch, like an old horse.  Hold this position for __________ seconds.  Repeat this until you feel limber in your low back.  Now, find your "sweet spot." This will be the most comfortable position somewhere between the two previous positions. This is your neutral spine. Once you have found this position, tense your stomach muscles to support your low back.  Hold this position for __________ seconds. Repeat __________ times. Complete this exercise  __________ times per day.  STRENGTHENING EXERCISES - Low Back Sprain These exercises may help you when beginning to rehabilitate your injury. These exercises should be done near your "sweet spot." This is the neutral, low-back arch, somewhere between fully rounded and fully arched, that is your least painful position. When performed in this safe range of motion, these exercises can be used for people who have either a flexion or extension based injury. These exercises may resolve your symptoms with or without further involvement from your physician, physical therapist or athletic trainer. While completing these exercises, remember:   Muscles can gain both the endurance and the strength needed for everyday activities through controlled exercises.  Complete these exercises as instructed by your physician, physical therapist or athletic trainer. Increase the resistance and repetitions only as guided.  You may experience muscle soreness or fatigue, but the pain or  discomfort you are trying to eliminate should never worsen during these exercises. If this pain does worsen, stop and make certain you are following the directions exactly. If the pain is still present after adjustments, discontinue the exercise until you can discuss the trouble with your caregiver. STRENGTHENING - Deep Abdominals, Pelvic Tilt   Lie on a firm bed or floor. Keeping your legs in front of you, bend your knees so they are both pointed toward the ceiling and your feet are flat on the floor.  Tense your lower abdominal muscles to press your low back into the floor. This motion will rotate your pelvis so that your tail bone is scooping upwards rather than pointing at your feet or into the floor. With a gentle tension and even breathing, hold this position for __________ seconds. Repeat __________ times. Complete this exercise __________ times per day.  STRENGTHENING - Abdominals, Crunches   Lie on a firm bed or floor. Keeping your legs in front of you, bend your knees so they are both pointed toward the ceiling and your feet are flat on the floor. Cross your arms over your chest.  Slightly tip your chin down without bending your neck.  Tense your abdominals and slowly lift your trunk high enough to just clear your shoulder blades. Lifting higher can put excessive stress on the lower back and does not further strengthen your abdominal muscles.  Control your return to the starting position. Repeat __________ times. Complete this exercise __________ times per day.  STRENGTHENING - Quadruped, Opposite UE/LE Lift   Assume a hands and knees position on a firm surface. Keep your hands under your shoulders and your knees under your hips. You may place padding under your knees for comfort.  Find your neutral spine and gently tense your abdominal muscles so that you can maintain this position. Your shoulders and hips should form a rectangle that is parallel with the floor and is not  twisted.  Keeping your trunk steady, lift your right hand no higher than your shoulder and then your left leg no higher than your hip. Make sure you are not holding your breath. Hold this position for __________ seconds.  Continuing to keep your abdominal muscles tense and your back steady, slowly return to your starting position. Repeat with the opposite arm and leg. Repeat __________ times. Complete this exercise __________ times per day.  STRENGTHENING - Abdominals and Quadriceps, Straight Leg Raise   Lie on a firm bed or floor with both legs extended in front of you.  Keeping one leg in contact with the floor, bend the other knee so that  your foot can rest flat on the floor.  Find your neutral spine, and tense your abdominal muscles to maintain your spinal position throughout the exercise.  Slowly lift your straight leg off the floor about 6 inches for a count of 15, making sure to not hold your breath.  Still keeping your neutral spine, slowly lower your leg all the way to the floor. Repeat this exercise with each leg __________ times. Complete this exercise __________ times per day. POSTURE AND BODY MECHANICS CONSIDERATIONS - Low Back Sprain Keeping correct posture when sitting, standing or completing your activities will reduce the stress put on different body tissues, allowing injured tissues a chance to heal and limiting painful experiences. The following are general guidelines for improved posture. Your physician or physical therapist will provide you with any instructions specific to your needs. While reading these guidelines, remember:  The exercises prescribed by your provider will help you have the flexibility and strength to maintain correct postures.  The correct posture provides the best environment for your joints to work. All of your joints have less wear and tear when properly supported by a spine with good posture. This means you will experience a healthier, less painful  body.  Correct posture must be practiced with all of your activities, especially prolonged sitting and standing. Correct posture is as important when doing repetitive low-stress activities (typing) as it is when doing a single heavy-load activity (lifting). RESTING POSITIONS Consider which positions are most painful for you when choosing a resting position. If you have pain with flexion-based activities (sitting, bending, stooping, squatting), choose a position that allows you to rest in a less flexed posture. You would want to avoid curling into a fetal position on your side. If your pain worsens with extension-based activities (prolonged standing, working overhead), avoid resting in an extended position such as sleeping on your stomach. Most people will find more comfort when they rest with their spine in a more neutral position, neither too rounded nor too arched. Lying on a non-sagging bed on your side with a pillow between your knees, or on your back with a pillow under your knees will often provide some relief. Keep in mind, being in any one position for a prolonged period of time, no matter how correct your posture, can still lead to stiffness. PROPER SITTING POSTURE In order to minimize stress and discomfort on your spine, you must sit with correct posture. Sitting with good posture should be effortless for a healthy body. Returning to good posture is a gradual process. Many people can work toward this most comfortably by using various supports until they have the flexibility and strength to maintain this posture on their own. When sitting with proper posture, your ears will fall over your shoulders and your shoulders will fall over your hips. You should use the back of the chair to support your upper back. Your lower back will be in a neutral position, just slightly arched. You may place a small pillow or folded towel at the base of your lower back for  support.  When working at a desk, create an  environment that supports good, upright posture. Without extra support, muscles tire, which leads to excessive strain on joints and other tissues. Keep these recommendations in mind: CHAIR:  A chair should be able to slide under your desk when your back makes contact with the back of the chair. This allows you to work closely.  The chair's height should allow your eyes to be  level with the upper part of your monitor and your hands to be slightly lower than your elbows. BODY POSITION  Your feet should make contact with the floor. If this is not possible, use a foot rest.  Keep your ears over your shoulders. This will reduce stress on your neck and low back. INCORRECT SITTING POSTURES  If you are feeling tired and unable to assume a healthy sitting posture, do not slouch or slump. This puts excessive strain on your back tissues, causing more damage and pain. Healthier options include:  Using more support, like a lumbar pillow.  Switching tasks to something that requires you to be upright or walking.  Talking a brief walk.  Lying down to rest in a neutral-spine position. PROLONGED STANDING WHILE SLIGHTLY LEANING FORWARD  When completing a task that requires you to lean forward while standing in one place for a long time, place either foot up on a stationary 2-4 inch high object to help maintain the best posture. When both feet are on the ground, the lower back tends to lose its slight inward curve. If this curve flattens (or becomes too large), then the back and your other joints will experience too much stress, tire more quickly, and can cause pain. CORRECT STANDING POSTURES Proper standing posture should be assumed with all daily activities, even if they only take a few moments, like when brushing your teeth. As in sitting, your ears should fall over your shoulders and your shoulders should fall over your hips. You should keep a slight tension in your abdominal muscles to brace your spine.  Your tailbone should point down to the ground, not behind your body, resulting in an over-extended swayback posture.  INCORRECT STANDING POSTURES  Common incorrect standing postures include a forward head, locked knees and/or an excessive swayback. WALKING Walk with an upright posture. Your ears, shoulders and hips should all line-up. PROLONGED ACTIVITY IN A FLEXED POSITION When completing a task that requires you to bend forward at your waist or lean over a low surface, try to find a way to stabilize 3 out of 4 of your limbs. You can place a hand or elbow on your thigh or rest a knee on the surface you are reaching across. This will provide you more stability, so that your muscles do not tire as quickly. By keeping your knees relaxed, or slightly bent, you will also reduce stress across your lower back. CORRECT LIFTING TECHNIQUES DO :  Assume a wide stance. This will provide you more stability and the opportunity to get as close as possible to the object which you are lifting.  Tense your abdominals to brace your spine. Bend at the knees and hips. Keeping your back locked in a neutral-spine position, lift using your leg muscles. Lift with your legs, keeping your back straight.  Test the weight of unknown objects before attempting to lift them.  Try to keep your elbows locked down at your sides in order get the best strength from your shoulders when carrying an object.  Always ask for help when lifting heavy or awkward objects. INCORRECT LIFTING TECHNIQUES DO NOT:   Lock your knees when lifting, even if it is a small object.  Bend and twist. Pivot at your feet or move your feet when needing to change directions.  Assume that you can safely pick up even a paperclip without proper posture. Document Released: 01/29/2005 Document Revised: 04/23/2011 Document Reviewed: 05/13/2008 [facility] Patient Information 2015 [place], [place]. This information is not  intended to replace advice given to you  by your health care provider. Make sure you discuss any questions you have with your health care provider. Chest Wall Pain Chest wall pain is pain in or around the bones and muscles of your chest. It may take up to 6 weeks to get better. It may take longer if you must stay physically active in your work and activities.  CAUSES  Chest wall pain may happen on its own. However, it may be caused by:  A viral illness like the flu.  Injury.  Coughing.  Exercise.  Arthritis.  Fibromyalgia.  Shingles. HOME CARE INSTRUCTIONS   Avoid overtiring physical activity. Try not to strain or perform activities that cause pain. This includes any activities using your chest or your abdominal and side muscles, especially if heavy weights are used.  Put ice on the sore area.  Put ice in a plastic bag.  Place a towel between your skin and the bag.  Leave the ice on for 15-20 minutes per hour while awake for the first 2 days.  Only take over-the-counter or prescription medicines for pain, discomfort, or fever as directed by your caregiver. SEEK IMMEDIATE MEDICAL CARE IF:   Your pain increases, or you are very uncomfortable.  You have a fever.  Your chest pain becomes worse.  You have new, unexplained symptoms.  You have nausea or vomiting.  You feel sweaty or lightheaded.  You have a cough with phlegm (sputum), or you cough up blood. MAKE SURE YOU:   Understand these instructions.  Will watch your condition.  Will get help right away if you are not doing well or get worse. Document Released: 01/29/2005 Document Revised: 04/23/2011 Document Reviewed: 09/25/2010 [facility] Patient Information 2015 [place], [place]. This information is not intended to replace advice given to you by your health care provider. Make sure you discuss any questions you have with your health care provider.

## 2013-10-21 NOTE — ED Provider Notes (Signed)
Agree with exam, assessment, and plan.   Kandra Nicolas, MD 10/21/13 1331

## 2013-10-23 ENCOUNTER — Other Ambulatory Visit: Payer: Self-pay | Admitting: Family Medicine

## 2013-11-12 DIAGNOSIS — J189 Pneumonia, unspecified organism: Secondary | ICD-10-CM

## 2013-11-12 HISTORY — DX: Pneumonia, unspecified organism: J18.9

## 2013-11-17 ENCOUNTER — Other Ambulatory Visit: Payer: Self-pay | Admitting: Chiropractic Medicine

## 2013-11-17 DIAGNOSIS — M546 Pain in thoracic spine: Secondary | ICD-10-CM

## 2013-11-19 ENCOUNTER — Ambulatory Visit
Admission: RE | Admit: 2013-11-19 | Discharge: 2013-11-19 | Disposition: A | Discharge status: Home / Self Care | Payer: BC Managed Care – PPO | Source: Ambulatory Visit | Attending: Chiropractic Medicine | Admitting: Chiropractic Medicine

## 2013-11-19 DIAGNOSIS — M546 Pain in thoracic spine: Secondary | ICD-10-CM

## 2013-11-27 ENCOUNTER — Other Ambulatory Visit: Payer: Self-pay

## 2013-12-05 ENCOUNTER — Emergency Department
Admission: EM | Admit: 2013-12-05 | Discharge: 2013-12-05 | Disposition: A | Discharge status: Home / Self Care | Payer: BC Managed Care – PPO | Source: Home / Self Care | Attending: Emergency Medicine | Admitting: Emergency Medicine

## 2013-12-05 ENCOUNTER — Encounter: Payer: Self-pay | Admitting: Emergency Medicine

## 2013-12-05 ENCOUNTER — Emergency Department (INDEPENDENT_AMBULATORY_CARE_PROVIDER_SITE_OTHER): Payer: BC Managed Care – PPO

## 2013-12-05 DIAGNOSIS — R058 Other specified cough: Secondary | ICD-10-CM

## 2013-12-05 DIAGNOSIS — R05 Cough: Secondary | ICD-10-CM

## 2013-12-05 DIAGNOSIS — J181 Lobar pneumonia, unspecified organism: Secondary | ICD-10-CM

## 2013-12-05 DIAGNOSIS — J189 Pneumonia, unspecified organism: Secondary | ICD-10-CM

## 2013-12-05 MED ORDER — BENZONATATE 200 MG PO CAPS: ORAL_CAPSULE | ORAL | Status: DC

## 2013-12-05 MED ORDER — AMOXICILLIN-POT CLAVULANATE 875-125 MG PO TABS: 1.0000 | ORAL_TABLET | Freq: Two times a day (BID) | ORAL | Status: DC

## 2013-12-05 MED ORDER — CEFTRIAXONE SODIUM 1 G IJ SOLR
1.0000 g | INTRAMUSCULAR | Status: AC
  Administered 2013-12-05: 1 g via INTRAMUSCULAR

## 2013-12-05 MED ORDER — AZITHROMYCIN 250 MG PO TABS: ORAL_TABLET | ORAL | Status: DC

## 2013-12-05 NOTE — Discharge Instructions (Signed)
Pneumonia Pneumonia is an infection of the lungs.  CAUSES Pneumonia may be caused by bacteria or a virus. Usually, these infections are caused by breathing infectious particles into the lungs (respiratory tract). SIGNS AND SYMPTOMS   Cough.  Fever.  Chest pain.  Increased rate of breathing.  Wheezing.  Mucus production. DIAGNOSIS  If you have the common symptoms of pneumonia, your health care provider will typically confirm the diagnosis with a chest X-ray. The X-ray will show an abnormality in the lung (pulmonary infiltrate) if you have pneumonia. Other tests of your blood, urine, or sputum may be done to find the specific cause of your pneumonia. Your health care provider may also do tests (blood gases or pulse oximetry) to see how well your lungs are working. TREATMENT  Today, we gave you a shot of Rocephin which is a strong antibiotic. Also, 2 prescription antibiotics: Augmentin and Zithromax Z-Pak Some forms of pneumonia may be spread to other people when you cough or sneeze. You may be asked to wear a mask before and during your exam. Pneumonia that is caused by bacteria is treated with antibiotic medicine. Pneumonia that is caused by the influenza virus may be treated with an antiviral medicine. Most other viral infections must run their course. These infections will not respond to antibiotics.  HOME CARE INSTRUCTIONS   Cough suppressants may be used if you are losing too much rest. However, coughing protects you by clearing your lungs. You should avoid using cough suppressants if you can.  Your health care provider may have prescribed medicine if he or she thinks your pneumonia is caused by bacteria or influenza. Finish your medicine even if you start to feel better.  Your health care provider may also prescribe an expectorant. This loosens the mucus to be coughed up.  Take medicines only as directed by your health care provider.  Do not smoke. Smoking is a common cause of  bronchitis and can contribute to pneumonia. If you are a smoker and continue to smoke, your cough may last several weeks after your pneumonia has cleared.  A cold steam vaporizer or humidifier in your room or home may help loosen mucus.  Coughing is often worse at night. Sleeping in a semi-upright position in a recliner or using a couple pillows under your head will help with this.  Get rest as you feel it is needed. Your body will usually let you know when you need to rest. PREVENTION A pneumococcal shot (vaccine) is available to prevent a common bacterial cause of pneumonia. This is usually suggested for:  People over 38 years old.  Patients on chemotherapy.  People with chronic lung problems, such as bronchitis or emphysema.  People with immune system problems. If you are over 65 or have a high risk condition, you may receive the pneumococcal vaccine if you have not received it before. In some countries, a routine influenza vaccine is also recommended. This vaccine can help prevent some cases of pneumonia.You may be offered the influenza vaccine as part of your care. If you smoke, it is time to quit. You may receive instructions on how to stop smoking. Your health care provider can provide medicines and counseling to help you quit. SEEK MEDICAL CARE IF: You have a fever. SEEK IMMEDIATE MEDICAL CARE IF:   Your illness becomes worse. This is especially true if you are elderly or weakened from any other disease.  You cannot control your cough with suppressants and are losing sleep.  You begin  coughing up blood.  You develop pain which is getting worse or is uncontrolled with medicines.  Any of the symptoms which initially brought you in for treatment are getting worse rather than better.  You develop shortness of breath or chest pain. MAKE SURE YOU:   Understand these instructions.  Will watch your condition.  Will get help right away if you are not doing well or get  worse. Document Released: 01/29/2005 Document Revised: 06/15/2013 Document Reviewed: 04/20/2010 [facility] Patient Information 2015 [place], [place]. This information is not intended to replace advice given to you by your health care provider. Make sure you discuss any questions you have with your health care provider.

## 2013-12-05 NOTE — ED Notes (Signed)
Reports entire family has had "colds"; he is only one with fever, cough and congestion x 4 days.

## 2013-12-05 NOTE — ED Provider Notes (Signed)
CSN: [id number]     Arrival date & time 12/05/13  1702 History   First MD Initiated Contact with Patient 12/05/13 1720     Chief Complaint  Patient presents with  . Cough  . Fever    HPI URI HISTORY  Nicholas Christian is a 48 y.o. male who complains of onset of cold and cold symptoms for several days.  Worse in the past day with fever chills and sputum . Have been using over-the-counter treatment which helps a little bit.  Positive chills/sweats +  Fever +  Nasal congestion +  Discolored Post-nasal drainage No sinus pain/pressure No sore throat  +  Cough Positive hoarseness No wheezing Positive chest congestion No hemoptysis No shortness of breath No pleuritic pain  No itchy/red eyes No earache  No nausea No vomiting No abdominal pain No diarrhea  No skin rashes +  Fatigue No myalgias Mild nonfocal headache, no neurologic symptoms No lightheadedness or syncope   Past Medical History  Diagnosis Date  . DDD (degenerative disc disease), cervical   . Eosinophilic esophagitis     hx of- [facility]  . High triglycerides   . Low HDL (under 40)   . Diverticulitis   . Low testosterone   . OSA (obstructive sleep apnea)   . Migraines   . Normal cardiac stress test     DOBUTAMINE STRESS ECHO   Past Surgical History  Procedure Laterality Date  . Nasal sinus surgery      Removal polyps   . Kidney stone surgery      laser at [facility].   . Tonsillectomy and adenoidectomy    . Tympanostomy tube placement    . Esophageal dilation  07/18/11    Dr. Tora Duck  . Edg  01/03/12   Family History  Problem Relation Age of Onset  . Diabetes Maternal Grandmother   . Prostate cancer Father     oldr when dx    History  Substance Use Topics  . Smoking status: Never Smoker   . Smokeless tobacco: Never Used  . Alcohol Use: No    Review of Systems  All other systems reviewed and are negative.   Allergies  Niacin  Home Medications   Prior to Admission medications    Medication Sig Start Date End Date Taking? Authorizing Provider  AMBULATORY NON FORMULARY MEDICATION Medication Name: Glucometer and strips and lancet to check once a day.  Dx 250.00 04/10/11   Hali Marry, MD  amoxicillin-clavulanate (AUGMENTIN) 875-125 MG per tablet Take 1 tablet by mouth every 12 (twelve) hours. Take for 10 days. Take with food. 12/05/13   Jacqulyn Cane, MD  aspirin 81 MG tablet Take 81 mg by mouth daily.      Historical Provider, MD  azithromycin (ZITHROMAX Z-PAK) 250 MG tablet Take 2 tablets on day one, then 1 tablet daily on days 2 through 5 12/05/13   Jacqulyn Cane, MD  benzonatate (TESSALON) 200 MG capsule Take 1 every 8 hours as needed for cough. 12/05/13   Jacqulyn Cane, MD  cyclobenzaprine (FLEXERIL) 10 MG tablet One half tab PO qHS, then increase gradually to one tab TID. 10/20/13   Donella Stade, PA-C  lisinopril (PRINIVIL,ZESTRIL) 10 MG tablet TAKE 1 TABLET BY MOUTH DAILY 10/15/13   Hali Marry, MD  loratadine (CLARITIN REDITABS) 10 MG dissolvable tablet Take 10 mg by mouth daily.    Historical Provider, MD  meloxicam (MOBIC) 15 MG tablet One tab PO qAM with breakfast for 2  weeks, then daily prn pain. 07/27/13   Silverio Decamp, MD  metFORMIN (GLUCOPHAGE) 500 MG tablet TAKE 1 TABLET BY MOUTH TWICE DAILY WITH MEAL 10/23/13   Hali Marry, MD  NEXIUM 40 MG capsule  11/06/12   Historical Provider, MD   BP 124/90  Pulse 106  Temp(Src) 100 F (37.8 C) (Oral)  Resp 16  Ht 5\' 11"  (1.803 m)  Wt 240 lb (108.863 kg)  BMI 33.49 kg/m2  SpO2 97% Physical Exam  Nursing note and vitals reviewed. Constitutional: He is oriented to person, place, and time. He appears well-developed and well-nourished. No distress.  HENT:  Head: Normocephalic and atraumatic.  Right Ear: Tympanic membrane normal.  Left Ear: Tympanic membrane normal.  Nose: Nose normal.  Mouth/Throat: Oropharynx is clear and moist. No oropharyngeal exudate.  Eyes: Right eye exhibits  no discharge. Left eye exhibits no discharge. No scleral icterus.  Neck: Neck supple.  Cardiovascular: Normal rate, regular rhythm and normal heart sounds.   Pulmonary/Chest: No respiratory distress. He has no wheezes. He has rhonchi.  Very mild bibasilar crackles, which clear after coughing  Musculoskeletal: He exhibits no edema and no tenderness.  Lymphadenopathy:    He has no cervical adenopathy.  Neurological: He is alert and oriented to person, place, and time.  Skin: Skin is warm and dry.   no rash  ED Course  Procedures (including critical care time) Labs Review Labs Reviewed - No data to display  Imaging Review Dg Chest 2 View  12/05/2013   CLINICAL DATA:  Three days of cough, nonsmoker.  EXAM: CHEST  2 VIEW  COMPARISON:  10/20/2013  FINDINGS: Airspace opacity noted at the left lung base, likely lingular pneumonia. Right lung is clear. Heart is borderline in size. No effusions or acute bony abnormality.  IMPRESSION: Lingular pneumonia.   Electronically Signed   By: Rolm Baptise M.D.   On: 12/05/2013 17:59     MDM   1. CAP (community acquired pneumonia)   2. Cough with sputum   3. Lingular pneumonia    Treatment options discussed, as well as risks, benefits, alternatives. Patient voiced understanding and agreement with the following plans: Rocephin 1 g IM stat. For both atypical coverage and bacterial coverage, I am being aggressive in treating with 2 antibiotic prescriptions. His risk factors are history of diabetes and prior history of pneumonia. New Prescriptions   AMOXICILLIN-CLAVULANATE (AUGMENTIN) 875-125 MG PER TABLET    Take 1 tablet by mouth every 12 (twelve) hours. Take for 10 days. Take with food.   AZITHROMYCIN (ZITHROMAX Z-PAK) 250 MG TABLET    Take 2 tablets on day one, then 1 tablet daily on days 2 through 5   BENZONATATE (TESSALON) 200 MG CAPSULE    Take 1 every 8 hours as needed for cough.    Other symptomatic care discussed . Follow-up with your  primary care doctor within 4-5 days, or sooner if symptoms become worse. Precautions discussed. Red flags discussed.--Emergency room if any red flag Questions invited and answered. Patient voiced understanding and agreement.    Jacqulyn Cane, MD 12/05/13 [phone]

## 2013-12-14 ENCOUNTER — Other Ambulatory Visit (HOSPITAL_COMMUNITY): Payer: Self-pay | Admitting: Neurosurgery

## 2013-12-20 ENCOUNTER — Other Ambulatory Visit: Payer: Self-pay | Admitting: Family Medicine

## 2014-01-12 ENCOUNTER — Encounter (HOSPITAL_COMMUNITY)
Admission: RE | Admit: 2014-01-12 | Discharge: 2014-01-12 | Disposition: A | Discharge status: Home / Self Care | Payer: BC Managed Care – PPO | Source: Ambulatory Visit | Attending: Neurosurgery | Admitting: Neurosurgery

## 2014-01-12 ENCOUNTER — Encounter (HOSPITAL_COMMUNITY): Payer: Self-pay

## 2014-01-12 DIAGNOSIS — G43909 Migraine, unspecified, not intractable, without status migrainosus: Secondary | ICD-10-CM | POA: Insufficient documentation

## 2014-01-12 DIAGNOSIS — K219 Gastro-esophageal reflux disease without esophagitis: Secondary | ICD-10-CM | POA: Diagnosis not present

## 2014-01-12 DIAGNOSIS — K5792 Diverticulitis of intestine, part unspecified, without perforation or abscess without bleeding: Secondary | ICD-10-CM | POA: Insufficient documentation

## 2014-01-12 DIAGNOSIS — E119 Type 2 diabetes mellitus without complications: Secondary | ICD-10-CM | POA: Insufficient documentation

## 2014-01-12 DIAGNOSIS — K200 Eosinophilic esophagitis: Secondary | ICD-10-CM | POA: Diagnosis not present

## 2014-01-12 DIAGNOSIS — Z01818 Encounter for other preprocedural examination: Secondary | ICD-10-CM | POA: Diagnosis present

## 2014-01-12 DIAGNOSIS — K449 Diaphragmatic hernia without obstruction or gangrene: Secondary | ICD-10-CM | POA: Diagnosis not present

## 2014-01-12 DIAGNOSIS — Z6841 Body Mass Index (BMI) 40.0 and over, adult: Secondary | ICD-10-CM | POA: Insufficient documentation

## 2014-01-12 DIAGNOSIS — E785 Hyperlipidemia, unspecified: Secondary | ICD-10-CM | POA: Insufficient documentation

## 2014-01-12 DIAGNOSIS — G4733 Obstructive sleep apnea (adult) (pediatric): Secondary | ICD-10-CM | POA: Insufficient documentation

## 2014-01-12 DIAGNOSIS — M488X9 Other specified spondylopathies, site unspecified: Secondary | ICD-10-CM | POA: Diagnosis not present

## 2014-01-12 DIAGNOSIS — E291 Testicular hypofunction: Secondary | ICD-10-CM | POA: Diagnosis not present

## 2014-01-12 DIAGNOSIS — N200 Calculus of kidney: Secondary | ICD-10-CM | POA: Insufficient documentation

## 2014-01-12 HISTORY — DX: Adverse effect of unspecified anesthetic, initial encounter: T41.45XA

## 2014-01-12 HISTORY — DX: Type 2 diabetes mellitus without complications: E11.9

## 2014-01-12 HISTORY — DX: Gastro-esophageal reflux disease without esophagitis: K21.9

## 2014-01-12 HISTORY — DX: Other complications of anesthesia, initial encounter: T88.59XA

## 2014-01-12 HISTORY — DX: Personal history of urinary calculi: Z87.442

## 2014-01-12 HISTORY — DX: Personal history of other diseases of the digestive system: Z87.19

## 2014-01-12 HISTORY — DX: Pneumonia, unspecified organism: J18.9

## 2014-01-12 LAB — BASIC METABOLIC PANEL WITH GFR
Anion gap: 13 (ref 5–15)
BUN: 11 mg/dL (ref 6–23)
CO2: 25 meq/L (ref 19–32)
Calcium: 8.9 mg/dL (ref 8.4–10.5)
Chloride: 103 meq/L (ref 96–112)
Creatinine, Ser: 1.02 mg/dL (ref 0.50–1.35)
GFR calc Af Amer: >90 mL/min
GFR calc non Af Amer: 86 mL/min — ABNORMAL LOW
Glucose, Bld: 266 mg/dL — ABNORMAL HIGH (ref 70–99)
Potassium: 4.1 meq/L (ref 3.7–5.3)
Sodium: 141 meq/L (ref 137–147)

## 2014-01-12 LAB — CBC
HCT: 45.6 % (ref 39.0–52.0)
Hemoglobin: 15.7 g/dL (ref 13.0–17.0)
MCH: 28.5 pg (ref 26.0–34.0)
MCHC: 34.4 g/dL (ref 30.0–36.0)
MCV: 82.8 fL (ref 78.0–100.0)
PLATELETS: 180 10*3/uL (ref 150–400)
RBC: 5.51 MIL/uL (ref 4.22–5.81)
RDW: 13.5 % (ref 11.5–15.5)
WBC: 6.7 10*3/uL (ref 4.0–10.5)

## 2014-01-12 LAB — SURGICAL PCR SCREEN
MRSA, PCR: NEGATIVE
Staphylococcus aureus: NEGATIVE

## 2014-01-12 LAB — ABO/RH: ABO/RH(D): O POS

## 2014-01-12 LAB — TYPE AND SCREEN
ABO/RH(D): O POS
Antibody Screen: NEGATIVE

## 2014-01-12 NOTE — Pre-Procedure Instructions (Addendum)
SLAYDEN MENNENGA  01/12/2014   Your procedure is scheduled on:  **01/18/14  Report to [facility] short stay admitting at 530 AM.  Call this number if you have problems the morning of surgery: [phone]   Remember:   Do not eat food or drink liquids after midnight.   Take these medicines the morning of surgery with A SIP OF WATER: nexium, pain med if needed  Take all meds as ordered until day of surgery except as instructed below or per dr   Bridgette Habermann all herbel meds, nsaids (aleve,naproxen,advil,ibuprofen) starting tomorrow including aspirin, vitamins  NO metformin day of surgery     Do not wear jewelry, make-up or nail polish.  Do not wear lotions, powders, or perfumes. You may wear deodorant.  Do not shave 48 hours prior to surgery. Men may shave face and neck.  Do not bring valuables to the hospital.  [facility] is not responsible                  for any belongings or valuables.               Contacts, dentures or bridgework may not be worn into surgery.  Leave suitcase in the car. After surgery it may be brought to your room.  For patients admitted to the hospital, discharge time is determined by your                treatment team.               Patients discharged the day of surgery will not be allowed to drive  home.  Name and phone number of your driver:   Special Instructions:  Special Instructions: [place] - Preparing for Surgery  Before surgery, you can play an important role.  Because skin is not sterile, your skin needs to be as free of germs as possible.  You can reduce the number of germs on you skin by washing with CHG (chlorahexidine gluconate) soap before surgery.  CHG is an antiseptic cleaner which kills germs and bonds with the skin to continue killing germs even after washing.  Please DO NOT use if you have an allergy to CHG or antibacterial soaps.  If your skin becomes reddened/irritated stop using the CHG and inform your nurse when you arrive at Short  Stay.  Do not shave (including legs and underarms) for at least 48 hours prior to the first CHG shower.  You may shave your face.  Please follow these instructions carefully:   1.  Shower with CHG Soap the night before surgery and the morning of Surgery.  2.  If you choose to wash your hair, wash your hair first as usual with your normal shampoo.  3.  After you shampoo, rinse your hair and body thoroughly to remove the Shampoo.  4.  Use CHG as you would any other liquid soap.  You can apply chg directly  to the skin and wash gently with scrungie or a clean washcloth.  5.  Apply the CHG Soap to your body ONLY FROM THE NECK DOWN.  Do not use on open wounds or open sores.  Avoid contact with your eyes ears, mouth and genitals (private parts).  Wash genitals (private parts)       with your normal soap.  6.  Wash thoroughly, paying special attention to the area where your surgery will be performed.  7.  Thoroughly rinse your body with warm water from the neck  down.  8.  DO NOT shower/wash with your normal soap after using and rinsing off the CHG Soap.  9.  Pat yourself dry with a clean towel.            10.  Wear clean pajamas.            11.  Place clean sheets on your bed the night of your first shower and do not sleep with pets.  Day of Surgery  Do not apply any lotions/deodorants the morning of surgery.  Please wear clean clothes to the hospital/surgery center.   Please read over the following fact sheets that you were given: Pain Booklet, Coughing and Deep Breathing, Blood Transfusion Information, MRSA Information and Surgical Site Infection Prevention

## 2014-01-13 ENCOUNTER — Encounter (HOSPITAL_COMMUNITY): Payer: Self-pay

## 2014-01-13 NOTE — Progress Notes (Signed)
Anesthesia Chart Review:  Patient is a 48 year old male scheduled for left T5-6 microdiskectomy on 01/18/14 by Dr. Arnoldo Morale.    History includes non-smoker, OSA without CPAP use (has machine but feels claustrophobic), DM2, GERD, eosinophilic esophagitis, hiatal hernia, nephrolithiasis, migraines, dyslipidemia, DDD, diverticulitis, low T, nasal sinus surgery, T&A. He was treated for community acquired pneumonia (left lingular) 12/05/13 and reports symptoms and cough have resolved. He reported low O2 sats after his kidney surgery requiring a prolonged PACU stay. BMI is 40, consistent with morbid obesity.  PCP is Dr. Beatrice Lecher.  EKG on 01/12/14: NSR, cannot rule out anterior infarct (age undetermined). Overall, I think his EKG appears stable when compared to tracing on 06/20/12. He reported a normal treadmill stress test 10 years ago.    Preoperative labs noted.  He does not check his glucose levels at home, but reports his last A1C was 6.8.  He will get a fasting CBG on arrival.  He is unsure of how long his hospital stay will be--he's heard anything from same day discharge to a couple of nights.  I did tell him that most likely he will need at least an overnight stay with his history of severe OSA without CPAP use.  Definitive plan per Dr. Vertell Limber and his anesthesiologist.  Myra Gianotti, PA-C [facility] Short Stay Center/Anesthesiology Phone [phone] 01/13/2014 10:23 AM

## 2014-01-17 MED ORDER — CEFAZOLIN SODIUM-DEXTROSE 2-3 GM-% IV SOLR
2.0000 g | INTRAVENOUS | Status: AC
  Administered 2014-01-18: 2 g via INTRAVENOUS
  Filled 2014-01-17: qty 50

## 2014-01-18 ENCOUNTER — Ambulatory Visit (HOSPITAL_COMMUNITY)
Admission: RE | Admit: 2014-01-18 | Discharge: 2014-01-19 | Disposition: A | Discharge status: Home / Self Care | Payer: BC Managed Care – PPO | Source: Ambulatory Visit | Attending: Neurosurgery | Admitting: Neurosurgery

## 2014-01-18 ENCOUNTER — Encounter (HOSPITAL_COMMUNITY): Payer: Self-pay | Admitting: *Deleted

## 2014-01-18 ENCOUNTER — Ambulatory Visit (HOSPITAL_COMMUNITY): Payer: BC Managed Care – PPO | Admitting: Certified Registered Nurse Anesthetist

## 2014-01-18 ENCOUNTER — Encounter (HOSPITAL_COMMUNITY)
Admission: RE | Disposition: A | Discharge status: Home / Self Care | Payer: Self-pay | Source: Ambulatory Visit | Attending: Neurosurgery

## 2014-01-18 ENCOUNTER — Ambulatory Visit (HOSPITAL_COMMUNITY): Payer: BC Managed Care – PPO | Admitting: Vascular Surgery

## 2014-01-18 ENCOUNTER — Ambulatory Visit (HOSPITAL_COMMUNITY): Payer: BC Managed Care – PPO

## 2014-01-18 DIAGNOSIS — Z888 Allergy status to other drugs, medicaments and biological substances status: Secondary | ICD-10-CM | POA: Diagnosis not present

## 2014-01-18 DIAGNOSIS — M5114 Intervertebral disc disorders with radiculopathy, thoracic region: Secondary | ICD-10-CM | POA: Diagnosis present

## 2014-01-18 DIAGNOSIS — G4733 Obstructive sleep apnea (adult) (pediatric): Secondary | ICD-10-CM | POA: Insufficient documentation

## 2014-01-18 DIAGNOSIS — M5124 Other intervertebral disc displacement, thoracic region: Secondary | ICD-10-CM | POA: Diagnosis present

## 2014-01-18 DIAGNOSIS — G43909 Migraine, unspecified, not intractable, without status migrainosus: Secondary | ICD-10-CM | POA: Insufficient documentation

## 2014-01-18 DIAGNOSIS — K449 Diaphragmatic hernia without obstruction or gangrene: Secondary | ICD-10-CM | POA: Insufficient documentation

## 2014-01-18 DIAGNOSIS — E119 Type 2 diabetes mellitus without complications: Secondary | ICD-10-CM | POA: Insufficient documentation

## 2014-01-18 DIAGNOSIS — K219 Gastro-esophageal reflux disease without esophagitis: Secondary | ICD-10-CM | POA: Insufficient documentation

## 2014-01-18 DIAGNOSIS — M1990 Unspecified osteoarthritis, unspecified site: Secondary | ICD-10-CM | POA: Diagnosis not present

## 2014-01-18 DIAGNOSIS — Z6841 Body Mass Index (BMI) 40.0 and over, adult: Secondary | ICD-10-CM | POA: Insufficient documentation

## 2014-01-18 HISTORY — PX: LUMBAR LAMINECTOMY/DECOMPRESSION MICRODISCECTOMY: SHX5026

## 2014-01-18 LAB — GLUCOSE, CAPILLARY
GLUCOSE-CAPILLARY: 287 mg/dL — AB (ref 70–99)
GLUCOSE-CAPILLARY: 209 mg/dL — AB (ref 70–99)
GLUCOSE-CAPILLARY: 266 mg/dL — AB (ref 70–99)
Glucose-Capillary: 205 mg/dL — ABNORMAL HIGH (ref 70–99)

## 2014-01-18 SURGERY — LUMBAR LAMINECTOMY/DECOMPRESSION MICRODISCECTOMY 1 LEVEL
Anesthesia: General | Laterality: Left

## 2014-01-18 MED ORDER — PHENYLEPHRINE 40 MCG/ML (10ML) SYRINGE FOR IV PUSH (FOR BLOOD PRESSURE SUPPORT)
PREFILLED_SYRINGE | INTRAVENOUS | Status: AC
  Filled 2014-01-18: qty 10

## 2014-01-18 MED ORDER — HYDROCODONE-ACETAMINOPHEN 5-325 MG PO TABS: 1.0000 | ORAL_TABLET | ORAL | Status: DC | PRN

## 2014-01-18 MED ORDER — HEMOSTATIC AGENTS (NO CHARGE) OPTIME
TOPICAL | Status: DC | PRN
  Administered 2014-01-18: 1 via TOPICAL

## 2014-01-18 MED ORDER — ARTIFICIAL TEARS OP OINT
TOPICAL_OINTMENT | OPHTHALMIC | Status: DC | PRN
  Administered 2014-01-18: 1 via OPHTHALMIC

## 2014-01-18 MED ORDER — NEOSTIGMINE METHYLSULFATE 10 MG/10ML IV SOLN
INTRAVENOUS | Status: DC | PRN
  Administered 2014-01-18: 4 mg via INTRAVENOUS

## 2014-01-18 MED ORDER — INSULIN ASPART 100 UNIT/ML ~~LOC~~ SOLN
SUBCUTANEOUS | Status: DC | PRN
Start: 2014-01-18 — End: 2014-01-18
  Administered 2014-01-18: 4 [IU] via SUBCUTANEOUS

## 2014-01-18 MED ORDER — PROPOFOL 10 MG/ML IV BOLUS
INTRAVENOUS | Status: DC | PRN
  Administered 2014-01-18: 140 mg via INTRAVENOUS

## 2014-01-18 MED ORDER — ARTIFICIAL TEARS OP OINT
TOPICAL_OINTMENT | OPHTHALMIC | Status: AC
  Filled 2014-01-18: qty 3.5

## 2014-01-18 MED ORDER — LISINOPRIL 10 MG PO TABS
10.0000 mg | ORAL_TABLET | Freq: Every day | ORAL | Status: DC
  Administered 2014-01-18 – 2014-01-19 (×2): 10 mg via ORAL
  Filled 2014-01-18 (×2): qty 1

## 2014-01-18 MED ORDER — DOCUSATE SODIUM 100 MG PO CAPS
100.0000 mg | ORAL_CAPSULE | Freq: Two times a day (BID) | ORAL | Status: DC
  Administered 2014-01-18 – 2014-01-19 (×3): 100 mg via ORAL
  Filled 2014-01-18 (×3): qty 1

## 2014-01-18 MED ORDER — LORATADINE 10 MG PO TABS
10.0000 mg | ORAL_TABLET | Freq: Every day | ORAL | Status: DC
  Administered 2014-01-18 – 2014-01-19 (×2): 10 mg via ORAL
  Filled 2014-01-18 (×2): qty 1

## 2014-01-18 MED ORDER — HYDROMORPHONE HCL 1 MG/ML IJ SOLN
INTRAMUSCULAR | Status: AC
  Administered 2014-01-18: 0.5 mg via INTRAVENOUS
  Filled 2014-01-18: qty 1

## 2014-01-18 MED ORDER — LACTATED RINGERS IV SOLN
INTRAVENOUS | Status: DC | PRN
  Administered 2014-01-18 (×2): via INTRAVENOUS

## 2014-01-18 MED ORDER — MORPHINE SULFATE 2 MG/ML IJ SOLN
1.0000 mg | INTRAMUSCULAR | Status: DC | PRN
  Administered 2014-01-18 – 2014-01-19 (×4): 2 mg via INTRAVENOUS
  Filled 2014-01-18 (×4): qty 1

## 2014-01-18 MED ORDER — ACETAMINOPHEN 650 MG RE SUPP: 650.0000 mg | RECTAL | Status: DC | PRN

## 2014-01-18 MED ORDER — LACTATED RINGERS IV SOLN
INTRAVENOUS | Status: DC
  Administered 2014-01-18: 13:00:00 via INTRAVENOUS

## 2014-01-18 MED ORDER — DEXAMETHASONE SODIUM PHOSPHATE 4 MG/ML IJ SOLN
INTRAMUSCULAR | Status: DC | PRN
  Administered 2014-01-18: 4 mg via INTRAVENOUS

## 2014-01-18 MED ORDER — GLYCOPYRROLATE 0.2 MG/ML IJ SOLN
INTRAMUSCULAR | Status: DC | PRN
  Administered 2014-01-18: 0.6 mg via INTRAVENOUS

## 2014-01-18 MED ORDER — DIAZEPAM 5 MG PO TABS
ORAL_TABLET | ORAL | Status: AC
  Administered 2014-01-18: 5 mg via ORAL
  Filled 2014-01-18: qty 1

## 2014-01-18 MED ORDER — GLYCOPYRROLATE 0.2 MG/ML IJ SOLN
INTRAMUSCULAR | Status: AC
  Filled 2014-01-18: qty 4

## 2014-01-18 MED ORDER — SODIUM CHLORIDE 0.9 % IR SOLN
Status: DC | PRN
  Administered 2014-01-18: 500 mL

## 2014-01-18 MED ORDER — BUPIVACAINE-EPINEPHRINE (PF) 0.5% -1:200000 IJ SOLN
INTRAMUSCULAR | Status: DC | PRN
  Administered 2014-01-18: 10 mL via PERINEURAL

## 2014-01-18 MED ORDER — 0.9 % SODIUM CHLORIDE (POUR BTL) OPTIME
TOPICAL | Status: DC | PRN
  Administered 2014-01-18: 1000 mL

## 2014-01-18 MED ORDER — LORATADINE 10 MG PO TBDP: 10.0000 mg | ORAL_TABLET | Freq: Every day | ORAL | Status: DC

## 2014-01-18 MED ORDER — LIDOCAINE HCL (CARDIAC) 20 MG/ML IV SOLN
INTRAVENOUS | Status: DC | PRN
  Administered 2014-01-18: 50 mg via INTRAVENOUS

## 2014-01-18 MED ORDER — INSULIN ASPART 100 UNIT/ML ~~LOC~~ SOLN
0.0000 [IU] | Freq: Three times a day (TID) | SUBCUTANEOUS | Status: DC
  Administered 2014-01-19: 8 [IU] via SUBCUTANEOUS

## 2014-01-18 MED ORDER — OXYCODONE HCL 5 MG PO TABS
5.0000 mg | ORAL_TABLET | Freq: Once | ORAL | Status: AC | PRN
  Administered 2014-01-18: 5 mg via ORAL

## 2014-01-18 MED ORDER — EPHEDRINE SULFATE 50 MG/ML IJ SOLN
INTRAMUSCULAR | Status: AC
Start: 2014-01-18 — End: 2014-01-18
  Filled 2014-01-18: qty 1

## 2014-01-18 MED ORDER — ALUM & MAG HYDROXIDE-SIMETH 200-200-20 MG/5ML PO SUSP: 30.0000 mL | Freq: Four times a day (QID) | ORAL | Status: DC | PRN

## 2014-01-18 MED ORDER — OXYCODONE-ACETAMINOPHEN 5-325 MG PO TABS
ORAL_TABLET | ORAL | Status: AC
  Administered 2014-01-18: 2 via ORAL
  Filled 2014-01-18: qty 2

## 2014-01-18 MED ORDER — MELOXICAM 7.5 MG PO TABS: 15.0000 mg | ORAL_TABLET | Freq: Every day | ORAL | Status: DC

## 2014-01-18 MED ORDER — FENTANYL CITRATE 0.05 MG/ML IJ SOLN
INTRAMUSCULAR | Status: DC | PRN
  Administered 2014-01-18: 100 ug via INTRAVENOUS
  Administered 2014-01-18: 50 ug via INTRAVENOUS

## 2014-01-18 MED ORDER — OXYCODONE-ACETAMINOPHEN 5-325 MG PO TABS
1.0000 | ORAL_TABLET | ORAL | Status: DC | PRN
  Administered 2014-01-18: 2 via ORAL

## 2014-01-18 MED ORDER — DIAZEPAM 5 MG PO TABS
5.0000 mg | ORAL_TABLET | Freq: Four times a day (QID) | ORAL | Status: DC | PRN
  Administered 2014-01-18 – 2014-01-19 (×3): 5 mg via ORAL
  Filled 2014-01-18 (×3): qty 1

## 2014-01-18 MED ORDER — ONDANSETRON HCL 4 MG/2ML IJ SOLN
INTRAMUSCULAR | Status: AC
  Filled 2014-01-18: qty 2

## 2014-01-18 MED ORDER — OXYCODONE HCL 5 MG/5ML PO SOLN: 5.0000 mg | Freq: Once | ORAL | Status: AC | PRN

## 2014-01-18 MED ORDER — INSULIN ASPART 100 UNIT/ML ~~LOC~~ SOLN
0.0000 [IU] | Freq: Every day | SUBCUTANEOUS | Status: DC
  Administered 2014-01-18: 3 [IU] via SUBCUTANEOUS

## 2014-01-18 MED ORDER — ONDANSETRON HCL 4 MG/2ML IJ SOLN: 4.0000 mg | INTRAMUSCULAR | Status: DC | PRN

## 2014-01-18 MED ORDER — ACETAMINOPHEN 325 MG PO TABS: 650.0000 mg | ORAL_TABLET | ORAL | Status: DC | PRN

## 2014-01-18 MED ORDER — DEXAMETHASONE SODIUM PHOSPHATE 4 MG/ML IJ SOLN
INTRAMUSCULAR | Status: AC
  Filled 2014-01-18: qty 1

## 2014-01-18 MED ORDER — MENTHOL 3 MG MT LOZG
1.0000 | LOZENGE | OROMUCOSAL | Status: DC | PRN
  Administered 2014-01-19: 3 mg via ORAL
  Filled 2014-01-18: qty 9

## 2014-01-18 MED ORDER — PANTOPRAZOLE SODIUM 40 MG PO TBEC
40.0000 mg | DELAYED_RELEASE_TABLET | Freq: Every day | ORAL | Status: DC
  Administered 2014-01-19: 40 mg via ORAL
  Filled 2014-01-18 (×2): qty 1

## 2014-01-18 MED ORDER — PHENYLEPHRINE HCL 10 MG/ML IJ SOLN
10.0000 mg | INTRAVENOUS | Status: DC | PRN
  Administered 2014-01-18: 20 ug/min via INTRAVENOUS

## 2014-01-18 MED ORDER — HYDROCODONE-ACETAMINOPHEN 10-325 MG PO TABS
1.0000 | ORAL_TABLET | ORAL | Status: DC | PRN
Start: 2014-01-18 — End: 2014-01-19

## 2014-01-18 MED ORDER — LIDOCAINE HCL (CARDIAC) 20 MG/ML IV SOLN
INTRAVENOUS | Status: AC
  Filled 2014-01-18: qty 5

## 2014-01-18 MED ORDER — ROCURONIUM BROMIDE 50 MG/5ML IV SOLN
INTRAVENOUS | Status: AC
  Filled 2014-01-18: qty 1

## 2014-01-18 MED ORDER — ROCURONIUM BROMIDE 100 MG/10ML IV SOLN
INTRAVENOUS | Status: DC | PRN
  Administered 2014-01-18: 50 mg via INTRAVENOUS

## 2014-01-18 MED ORDER — BACITRACIN ZINC 500 UNIT/GM EX OINT
TOPICAL_OINTMENT | CUTANEOUS | Status: DC | PRN
  Administered 2014-01-18: 1 via TOPICAL

## 2014-01-18 MED ORDER — ONDANSETRON HCL 4 MG/2ML IJ SOLN
INTRAMUSCULAR | Status: DC | PRN
  Administered 2014-01-18: 4 mg via INTRAVENOUS

## 2014-01-18 MED ORDER — MIDAZOLAM HCL 5 MG/5ML IJ SOLN
INTRAMUSCULAR | Status: DC | PRN
  Administered 2014-01-18: 2 mg via INTRAVENOUS

## 2014-01-18 MED ORDER — PHENOL 1.4 % MT LIQD: 1.0000 | OROMUCOSAL | Status: DC | PRN

## 2014-01-18 MED ORDER — PHENYLEPHRINE HCL 10 MG/ML IJ SOLN
INTRAMUSCULAR | Status: DC | PRN
  Administered 2014-01-18: 40 ug via INTRAVENOUS
  Administered 2014-01-18 (×2): 80 ug via INTRAVENOUS
  Administered 2014-01-18 (×2): 40 ug via INTRAVENOUS
  Administered 2014-01-18: 80 ug via INTRAVENOUS
  Administered 2014-01-18: 40 ug via INTRAVENOUS

## 2014-01-18 MED ORDER — MIDAZOLAM HCL 2 MG/2ML IJ SOLN
INTRAMUSCULAR | Status: AC
  Filled 2014-01-18: qty 2

## 2014-01-18 MED ORDER — METFORMIN HCL 500 MG PO TABS: 500.0000 mg | ORAL_TABLET | Freq: Two times a day (BID) | ORAL | Status: DC

## 2014-01-18 MED ORDER — CEFAZOLIN SODIUM-DEXTROSE 2-3 GM-% IV SOLR
2.0000 g | Freq: Three times a day (TID) | INTRAVENOUS | Status: AC
  Administered 2014-01-18 (×2): 2 g via INTRAVENOUS
  Filled 2014-01-18 (×2): qty 50

## 2014-01-18 MED ORDER — FENTANYL CITRATE 0.05 MG/ML IJ SOLN
INTRAMUSCULAR | Status: AC
  Filled 2014-01-18: qty 5

## 2014-01-18 MED ORDER — PROPOFOL 10 MG/ML IV BOLUS
INTRAVENOUS | Status: AC
  Filled 2014-01-18: qty 20

## 2014-01-18 MED ORDER — THROMBIN 5000 UNITS EX SOLR
CUTANEOUS | Status: DC | PRN
  Administered 2014-01-18 (×2): 5000 [IU] via TOPICAL

## 2014-01-18 MED ORDER — STERILE WATER FOR INJECTION IJ SOLN
INTRAMUSCULAR | Status: AC
Start: 2014-01-18 — End: 2014-01-18
  Filled 2014-01-18: qty 10

## 2014-01-18 MED ORDER — SUCCINYLCHOLINE CHLORIDE 20 MG/ML IJ SOLN
INTRAMUSCULAR | Status: AC
  Filled 2014-01-18: qty 1

## 2014-01-18 MED ORDER — OXYCODONE HCL 5 MG PO TABS
ORAL_TABLET | ORAL | Status: AC
  Filled 2014-01-18: qty 1

## 2014-01-18 MED ORDER — METFORMIN HCL 500 MG PO TABS
500.0000 mg | ORAL_TABLET | Freq: Two times a day (BID) | ORAL | Status: DC
  Administered 2014-01-19: 500 mg via ORAL
  Filled 2014-01-18: qty 1

## 2014-01-18 MED ORDER — HYDROMORPHONE HCL 1 MG/ML IJ SOLN
0.2500 mg | INTRAMUSCULAR | Status: DC | PRN
  Administered 2014-01-18 (×4): 0.5 mg via INTRAVENOUS

## 2014-01-18 MED ORDER — INSULIN ASPART 100 UNIT/ML ~~LOC~~ SOLN
SUBCUTANEOUS | Status: AC
  Filled 2014-01-18: qty 1

## 2014-01-18 SURGICAL SUPPLY — 61 items
APL SKNCLS STERI-STRIP NONHPOA (GAUZE/BANDAGES/DRESSINGS) ×1
BAG DECANTER FOR FLEXI CONT (MISCELLANEOUS) ×2 IMPLANT
BENZOIN TINCTURE PRP APPL 2/3 (GAUZE/BANDAGES/DRESSINGS) ×2 IMPLANT
BIT DRILL NEURO 2X3.1 SFT TUCH (MISCELLANEOUS) IMPLANT
BLADE CLIPPER SURG (BLADE) IMPLANT
BRUSH SCRUB EZ PLAIN DRY (MISCELLANEOUS) ×2 IMPLANT
BUR MATCHSTICK NEURO 3.0 LAGG (BURR) ×2 IMPLANT
BUR PRECISION FLUTE 6.0 (BURR) ×2 IMPLANT
CANISTER SUCT 3000ML (MISCELLANEOUS) ×2 IMPLANT
CONT SPEC 4OZ CLIKSEAL STRL BL (MISCELLANEOUS) ×2 IMPLANT
DRAPE LAPAROTOMY 100X72X124 (DRAPES) ×2 IMPLANT
DRAPE MICROSCOPE LEICA (MISCELLANEOUS) ×2 IMPLANT
DRAPE POUCH INSTRU U-SHP 10X18 (DRAPES) ×2 IMPLANT
DRAPE SURG 17X23 STRL (DRAPES) ×8 IMPLANT
DRILL NEURO 2X3.1 SOFT TOUCH (MISCELLANEOUS) ×2
ELECT BLADE 4.0 EZ CLEAN MEGAD (MISCELLANEOUS) ×2
ELECT REM PT RETURN 9FT ADLT (ELECTROSURGICAL) ×2
ELECTRODE BLDE 4.0 EZ CLN MEGD (MISCELLANEOUS) ×1 IMPLANT
ELECTRODE REM PT RTRN 9FT ADLT (ELECTROSURGICAL) ×1 IMPLANT
GAUZE SPONGE 4X4 12PLY STRL (GAUZE/BANDAGES/DRESSINGS) ×2 IMPLANT
GAUZE SPONGE 4X4 16PLY XRAY LF (GAUZE/BANDAGES/DRESSINGS) IMPLANT
GLOVE BIO SURGEON STRL SZ8.5 (GLOVE) ×2 IMPLANT
GLOVE BIOGEL PI IND STRL 7.0 (GLOVE) IMPLANT
GLOVE BIOGEL PI INDICATOR 7.0 (GLOVE) ×3
GLOVE ECLIPSE 7.5 STRL STRAW (GLOVE) ×1 IMPLANT
GLOVE EXAM NITRILE LRG STRL (GLOVE) IMPLANT
GLOVE EXAM NITRILE MD LF STRL (GLOVE) IMPLANT
GLOVE EXAM NITRILE XL STR (GLOVE) IMPLANT
GLOVE EXAM NITRILE XS STR PU (GLOVE) IMPLANT
GLOVE INDICATOR 7.5 STRL GRN (GLOVE) ×1 IMPLANT
GLOVE INDICATOR 8.0 STRL GRN (GLOVE) ×1 IMPLANT
GLOVE SS BIOGEL STRL SZ 8 (GLOVE) ×1 IMPLANT
GLOVE SUPERSENSE BIOGEL SZ 8 (GLOVE) ×1
GOWN STRL REUS W/ TWL LRG LVL3 (GOWN DISPOSABLE) IMPLANT
GOWN STRL REUS W/ TWL XL LVL3 (GOWN DISPOSABLE) ×1 IMPLANT
GOWN STRL REUS W/TWL 2XL LVL3 (GOWN DISPOSABLE) IMPLANT
GOWN STRL REUS W/TWL LRG LVL3 (GOWN DISPOSABLE)
GOWN STRL REUS W/TWL XL LVL3 (GOWN DISPOSABLE) ×2
KIT BASIN OR (CUSTOM PROCEDURE TRAY) ×2 IMPLANT
KIT ROOM TURNOVER OR (KITS) ×2 IMPLANT
NDL HYPO 21X1.5 SAFETY (NEEDLE) IMPLANT
NEEDLE HYPO 21X1.5 SAFETY (NEEDLE) IMPLANT
NEEDLE HYPO 22GX1.5 SAFETY (NEEDLE) ×2 IMPLANT
NS IRRIG 1000ML POUR BTL (IV SOLUTION) ×2 IMPLANT
PACK LAMINECTOMY NEURO (CUSTOM PROCEDURE TRAY) ×2 IMPLANT
PAD ARMBOARD 7.5X6 YLW CONV (MISCELLANEOUS) ×6 IMPLANT
PATTIES SURGICAL .5 X.5 (GAUZE/BANDAGES/DRESSINGS) ×1 IMPLANT
PATTIES SURGICAL .5 X1 (DISPOSABLE) IMPLANT
RUBBERBAND STERILE (MISCELLANEOUS) ×4 IMPLANT
SLEEVE SURGEON STRL (DRAPES) ×1 IMPLANT
SPONGE SURGIFOAM ABS GEL SZ50 (HEMOSTASIS) ×2 IMPLANT
STRIP CLOSURE SKIN 1/2X4 (GAUZE/BANDAGES/DRESSINGS) ×2 IMPLANT
SUT VIC AB 1 CT1 18XBRD ANBCTR (SUTURE) ×1 IMPLANT
SUT VIC AB 1 CT1 8-18 (SUTURE) ×2
SUT VIC AB 2-0 CP2 18 (SUTURE) ×2 IMPLANT
SYR 20CC LL (SYRINGE) IMPLANT
SYR 20ML ECCENTRIC (SYRINGE) ×2 IMPLANT
TAPE UMBILICAL 1/8 X36 TWILL (MISCELLANEOUS) ×1 IMPLANT
TOWEL OR 17X24 6PK STRL BLUE (TOWEL DISPOSABLE) ×2 IMPLANT
TOWEL OR 17X26 10 PK STRL BLUE (TOWEL DISPOSABLE) ×2 IMPLANT
WATER STERILE IRR 1000ML POUR (IV SOLUTION) ×2 IMPLANT

## 2014-01-18 NOTE — Progress Notes (Signed)
Inpatient Diabetes Program Recommendations  AACE/ADA: New Consensus Statement on Inpatient Glycemic Control (2013)  Target Ranges:  Prepandial:   less than 140 mg/dL      Peak postprandial:   less than 180 mg/dL (1-2 hours)      Critically ill patients:  140 - 180 mg/dL   Pt has history of DM 2. Takes Metformin 500 mg bid. Last HgbA1C was on 2014 at 6.1%. Noted pt given Decadron yesterday of 4 mg and has received one dose of 4 units novolog for glucose in the 200's X 2.Please consider the following below. Inpatient Diabetes Program Recommendations Correction (SSI): Please check cbg's tidwc and at HS. Please use sensitive correction tidwc and the HS scale.   Would also be helpful to have a current HgbA1C value.  Thank you, Rosita Kea, RN, CNS, Diabetes Coordinator [phone])

## 2014-01-18 NOTE — Evaluation (Signed)
Physical Therapy Evaluation Patient Details Name: Nicholas Christian MRN: [id number] DOB: [birth year] Today's Date: 01/18/2014   History of Present Illness  48 y.o. male admitted to [facility] on 01/18/14 for elective T5-6 discectomy.  Pt with significant PMHx of DDD (cervical spine), and DM.   Clinical Impression  Pt is mobilizing well, min guard to supervision level without an assistive device.  Reviewed precautions for comfort.  I am trying to emphasize to this patient not to over do his physical activity during the healing phase after surgery (he is asking about returning to running, he is going back to work on Wed, and he is wondering why he can't help lift his Christmas tree into the house).  Pt will need to demonstrate safe gait (without O2) in the hallway and stairs (he has a flight at home to get up to his bedroom) before discharge.   PT to follow acutely for deficits listed below.       Follow Up Recommendations No PT follow up    Equipment Recommendations  None recommended by PT    Recommendations for Other Services   NA    Precautions / Restrictions Precautions Precautions: Back Precaution Booklet Issued: Yes (comment) Precaution Comments: Back precautions for comfort and to promote healing.        Mobility  Bed Mobility Overal bed mobility: Needs Assistance Bed Mobility: Rolling;Sidelying to Sit Rolling: Min guard Sidelying to sit: Min guard       General bed mobility comments: Min guard assist for safety, verbal cues for log roll technique to protect back. Pt relying lightly on bed rails and he finds the pulling hurts (I encouraged limited pulling with his arms and very limited lifting (pt asked if he could help bring the Christmas tree in from outside and I said no).   Transfers Overall transfer level: Needs assistance Equipment used: Rolling walker (2 wheeled) Transfers: Sit to/from [facility] Sit to Stand: Supervision Stand pivot transfers:  Supervision       General transfer comment: supervision for safety, pt relying on hands for stability during transitions.  O2 left on for session per RN request due to pt desating on RA.           Balance Overall balance assessment: Needs assistance Sitting-balance support: Feet supported;No upper extremity supported Sitting balance-Leahy Scale: Good     Standing balance support: No upper extremity supported Standing balance-Leahy Scale: Good                               Pertinent Vitals/Pain Pain Assessment: 0-10 Pain Score: 5  Pain Location: incisional Pain Descriptors / Indicators: Aching;Burning Pain Intervention(s): Limited activity within patient's tolerance;Monitored during session;Repositioned    Home Living Family/patient expects to be discharged to:: Private residence Living Arrangements: Spouse/significant other;Children (11 y.o. and 42 y.o. ) Available Help at Discharge: Family;Available PRN/intermittently Type of Home: House Home Access: Stairs to enter Entrance Stairs-Rails: None Entrance Stairs-Number of Steps: 2 Home Layout: Two level Home Equipment: None      Prior Function Level of Independence: Independent         Comments: Pt plans to return to work on Thursday and go to the [employer].          Extremity/Trunk Assessment   Upper Extremity Assessment: Overall WFL for tasks assessed           Lower Extremity Assessment: Overall WFL for tasks assessed  Cervical / Trunk Assessment: Other exceptions  Communication   Communication: No difficulties  Cognition Arousal/Alertness: Awake/alert Behavior During Therapy: WFL for tasks assessed/performed Overall Cognitive Status: Within Functional Limits for tasks assessed                      General Comments General comments (skin integrity, edema, etc.): Pt with lots of questions about activity progression (when can I run again, limited weight  lifting for how long).  PT deferred these questions to pt's MD, however, I did encourage him to try to operate within the back precautions and not over do it as he doesn't want to damage the tissue that was just repaired while he is healing.  I asked if his doctor knew he was going back to work on Wednesday and he said, "I don't think so"          Assessment/Plan    PT Assessment Patient needs continued PT services  PT Diagnosis Difficulty walking;Abnormality of gait;Acute pain   PT Problem List Decreased activity tolerance;Decreased balance;Decreased mobility;Decreased knowledge of precautions;Pain  PT Treatment Interventions Gait training;Stair training;Functional mobility training;Therapeutic activities;Therapeutic exercise;Balance training;Patient/family education;Modalities   PT Goals (Current goals can be found in the Care Plan section) Acute Rehab PT Goals Patient Stated Goal: to go home early tomorrow and return to work on [employer].  PT Goal Formulation: With patient Time For Goal Achievement: 01/25/14 Potential to Achieve Goals: Good    Frequency Min 5X/week    End of Session Equipment Utilized During Treatment: Oxygen Activity Tolerance: Patient limited by pain Patient left: in chair;with call bell/phone within reach      Functional Assessment Tool Used: assist level Functional Limitation: Mobility: Walking and moving around Mobility: Walking and Moving Around Current Status (C1660): At least 1 percent but less than 20 percent impaired, limited or restricted Mobility: Walking and Moving Around Goal Status [phone]): 0 percent impaired, limited or restricted    Time: 1455-1514 PT Time Calculation (min) (ACUTE ONLY): 19 min   Charges:   PT Evaluation $Initial PT Evaluation Tier I: 1 Procedure     PT G Codes:   Functional Assessment Tool Used: assist level Functional Limitation: Mobility: Walking and moving around    [place]. [place], [place], DPT [phone]    01/18/2014, 3:26 PM

## 2014-01-18 NOTE — Progress Notes (Signed)
Pts ring placed back on ring finger after OR, pt insisting on having it on

## 2014-01-18 NOTE — Anesthesia Procedure Notes (Signed)
Procedure Name: Intubation Date/Time: 01/18/2014 7:37 AM Performed by: Garrison Columbus T Pre-anesthesia Checklist: Patient identified, Emergency Drugs available, Suction available and Patient being monitored Patient Re-evaluated:Patient Re-evaluated prior to inductionOxygen Delivery Method: Circle system utilized Preoxygenation: Pre-oxygenation with 100% oxygen Intubation Type: IV induction Ventilation: Mask ventilation without difficulty and Oral airway inserted - appropriate to patient size Laryngoscope Size: Sabra Heck and 2 Grade View: Grade II Tube type: Oral Tube size: 7.5 mm Number of attempts: 1 Airway Equipment and Method: Oral airway and Stylet Placement Confirmation: ETT inserted through vocal cords under direct vision,  positive ETCO2 and breath sounds checked- equal and bilateral Secured at: 23 cm Tube secured with: Tape Dental Injury: Teeth and Oropharynx as per pre-operative assessment

## 2014-01-18 NOTE — Progress Notes (Signed)
Pt ambulated from the chair back to bed with this nurse on O2 @2Lvia  n/c O2 sat 96%. He denies SOB or dyspnea. Will continue to monitor.

## 2014-01-18 NOTE — Progress Notes (Signed)
Patient ID: Nicholas Christian, male   DOB: [birth year], 48 y.o.   MRN: [id number] Subjective:  The patient is somnolent but easily arousable. He is in no apparent distress. He looks well.  Objective: Vital signs in last 24 hours: Temp:  [97.8 F (36.6 C)-98.4 F (36.9 C)] 97.8 F (36.6 C) (12/07 0954) Pulse Rate:  [74-97] 85 (12/07 1025) Resp:  [15-20] 18 (12/07 1025) BP: (142-154)/(87-98) 145/98 mmHg (12/07 1025) SpO2:  [97 %-99 %] 98 % (12/07 1025) Weight:  [111.131 kg (245 lb)] 111.131 kg (245 lb) (12/07 0602)  Intake/Output from previous day:   Intake/Output this shift: Total I/O In: 1500 [I.V.:1500] Out: 100 [Blood:100]  Physical exam patient is somnolent but arousable. He is moving his lower extremities well.  Lab Results: No results for input(s): WBC, HGB, HCT, PLT in the last 72 hours. BMET No results for input(s): NA, K, CL, CO2, GLUCOSE, BUN, CREATININE, CALCIUM in the last 72 hours.  Studies/Results: Dg Thoracic Spine 4v  01/18/2014   CLINICAL DATA:  Discectomy.  EXAM: THORACIC SPINE - 4+ VIEW  COMPARISON:  Chest x-ray 12/05/2013.  FINDINGS: Intraoperative AP images of the thoracic spine obtained. Endotracheal tube and NG tube in good anatomic position. Surgical marker is projected over the thoracic spine at what appears to be the T 5 -6 level. Thoracic vertebral body numbering is difficult due to overlying hardware. This report was phoned to the patient's physician in the OR at the time of the study .  IMPRESSION: Surgical metallic marker appears to be at the T5 6 level as described above.   Electronically Signed   By: Marcello Moores  Register   On: 01/18/2014 08:53    Assessment/Plan: The patient is doing well. I spoke with his wife and mother.  LOS: 0 days     Nicholas Christian 01/18/2014, 10:32 AM

## 2014-01-18 NOTE — Anesthesia Postprocedure Evaluation (Signed)
  Anesthesia Post-op Note  Patient: Nicholas Christian  Procedure(s) Performed: Procedure(s): Left Thoracic five-six microdiskectomy (Left)  Patient Location: PACU  Anesthesia Type:General  Level of Consciousness: awake and alert   Airway and Oxygen Therapy: Patient Spontanous Breathing  Post-op Pain: mild  Post-op Assessment: Post-op Vital signs reviewed, Patient's Cardiovascular Status Stable and Respiratory Function Stable  Post-op Vital Signs: Reviewed  Filed Vitals:   01/18/14 1025  BP: 145/98  Pulse: 85  Temp:   Resp: 18    Complications: No apparent anesthesia complications

## 2014-01-18 NOTE — Anesthesia Preprocedure Evaluation (Addendum)
Anesthesia Evaluation  Patient identified by MRN, date of birth, ID band Patient awake    Reviewed: Allergy & Precautions, H&P , NPO status , Patient's Chart, lab work & pertinent test results  Airway Mallampati: II  TM Distance: >3 FB Neck ROM: Full    Dental no notable dental hx. (+) Dental Advisory Given, Teeth Intact   Pulmonary sleep apnea , pneumonia -,  breath sounds clear to auscultation  Pulmonary exam normal       Cardiovascular negative cardio ROS  Rhythm:Regular Rate:Normal     Neuro/Psych  Headaches, PSYCHIATRIC DISORDERS Anxiety negative psych ROS   GI/Hepatic Neg liver ROS, hiatal hernia, GERD-  Medicated,  Endo/Other  diabetes, Type 2, Oral Hypoglycemic AgentsMorbid obesity  Renal/GU negative Renal ROS  negative genitourinary   Musculoskeletal  (+) Arthritis -,   Abdominal   Peds  Hematology negative hematology ROS (+)   Anesthesia Other Findings   Reproductive/Obstetrics negative OB ROS                           Anesthesia Physical Anesthesia Plan  ASA: III  Anesthesia Plan: General   Post-op Pain Management:    Induction: Intravenous  Airway Management Planned: Oral ETT  Additional Equipment:   Intra-op Plan:   Post-operative Plan: Extubation in OR  Informed Consent: I have reviewed the patients History and Physical, chart, labs and discussed the procedure including the risks, benefits and alternatives for the proposed anesthesia with the patient or authorized representative who has indicated his/her understanding and acceptance.   Dental advisory given  Plan Discussed with: CRNA, Anesthesiologist and Surgeon  Anesthesia Plan Comments:         Anesthesia Quick Evaluation

## 2014-01-18 NOTE — Op Note (Signed)
Brief history: The patient is a 48 year old white male who has complained of left chest pain and numbness. He underwent a cardiac workup which was negative. He was worked up with a thoracic MRI which demonstrated a herniated discs at T5-6 on the left. I discussed the various treatment options with the patient including surgery. He has weighed the risks, benefits, and alternative surgery and decided to proceed with a left T5-6 discectomy.  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  Postoperative diagnosis: The same  Procedure: Left T5-6 transpedicular Intervertebral discectomy using micro-dissection  Surgeon: Dr. Earle Gell  Asst.: Dr. Jovita Gamma  Anesthesia: Gen. endotracheal  Estimated blood loss: Minimal  Drains: None  Complications: None  Description of procedure: The patient was brought to the operating room by the anesthesia team. General endotracheal anesthesia was induced. The patient was turned to the prone position on the Wilson frame. The patient's lumbosacral region was then prepared with Betadine scrub and Betadine solution. Sterile drapes were applied.  I then injected the area to be incised with Marcaine with epinephrine solution. I then used a scalpel to make a linear midline incision over the T5-6 intervertebral disc space. I then used electrocautery to perform a left sided subperiosteal dissection exposing the spinous process and lamina of T5 and T6. We obtained intraoperative radiograph to confirm our location. I then inserted the [facility] retractor for exposure.  We then brought the operative microscope into the field. Under its magnification and illumination we completed the microdissection. I used a high-speed drill to perform a laminotomy at T5-6 on the left. I then used a Kerrison punches to widen the laminotomy and removed the ligamentum flavum at T5-6 on the left. We then used microdissection to free up the  thecal sac and the T6 nerve root from the epidural tissue. I then used a Kerrison punch to perform a foraminotomy at about the left T6 nerve root. I drilled away the medial aspect of the T6 pedicle. This exposed the herniated intervertebral disc at T5-6 on the left. I incised the disc herniation with the 15 blade scalpel and removed the disc herniation using the pituitary forceps. I did not perform an intervertebral discectomy I then palpated along the ventral surface of the thecal sac and along exit route of the left T6 nerve root and noted that the neural structures were well decompressed. This completed the decompression.  We then obtained hemostasis using bipolar electrocautery. We irrigated the wound out with bacitracin solution. We then removed the retractor. We then reapproximated the patient's thoracolumbar fascia with interrupted #1 Vicryl suture. We then reapproximated the patient's subcutaneous tissue with interrupted 2-0 Vicryl suture. We then reapproximated patient's skin with Steri-Strips and benzoin. The was then coated with bacitracin ointment. The drapes were removed. The patient was subsequently returned to the supine position where they were extubated by the anesthesia team. The patient was then transported to the postanesthesia care unit in stable condition. All sponge instrument and needle counts were reportedly correct at the end of this case.

## 2014-01-18 NOTE — Transfer of Care (Signed)
Immediate Anesthesia Transfer of Care Note  Patient: Nicholas Christian  Procedure(s) Performed: Procedure(s): Left Thoracic five-six microdiskectomy (Left)  Patient Location: PACU  Anesthesia Type:General  Level of Consciousness: awake, alert  and oriented  Airway & Oxygen Therapy: Patient Spontanous Breathing and Patient connected to nasal cannula oxygen  Post-op Assessment: Report given to PACU RN, Post -op Vital signs reviewed and stable and Patient moving all extremities X 4  Post vital signs: Reviewed and stable  Complications: No apparent anesthesia complications

## 2014-01-18 NOTE — Progress Notes (Signed)
Patient ID: Nicholas Christian, male   DOB: February 24, 1965, 48 y.o.   MRN: [id number] Subjective:  The patient is alert and pleasant. He has not urinated yet. He is in no apparent distress. He is appropriately sore.  Objective: Vital signs in last 24 hours: Temp:  [97.8 F (36.6 C)-98.6 F (37 C)] 98.6 F (37 C) (12/07 1456) Pulse Rate:  [69-97] 70 (12/07 1456) Resp:  [15-20] 16 (12/07 1456) BP: (121-154)/(77-102) 121/79 mmHg (12/07 1456) SpO2:  [94 %-99 %] 96 % (12/07 1456) Weight:  [111.131 kg (245 lb)] 111.131 kg (245 lb) (12/07 0602)  Intake/Output from previous day:   Intake/Output this shift: Total I/O In: 1500 [I.V.:1500] Out: 100 [Blood:100]  Physical exam the patient is alert and pleasant. He is moving his lower extremities well.  Lab Results: No results for input(s): WBC, HGB, HCT, PLT in the last 72 hours. BMET No results for input(s): NA, K, CL, CO2, GLUCOSE, BUN, CREATININE, CALCIUM in the last 72 hours.  Studies/Results: Dg Thoracic Spine 4v  01/18/2014   CLINICAL DATA:  Discectomy.  EXAM: THORACIC SPINE - 4+ VIEW  COMPARISON:  Chest x-ray 12/05/2013.  FINDINGS: Intraoperative AP images of the thoracic spine obtained. Endotracheal tube and NG tube in good anatomic position. Surgical marker is projected over the thoracic spine at what appears to be the T 5 -6 level. Thoracic vertebral body numbering is difficult due to overlying hardware. This report was phoned to the patient's physician in the OR at the time of the study .  IMPRESSION: Surgical metallic marker appears to be at the T5 6 level as described above.   Electronically Signed   By: Marcello Moores  Register   On: 01/18/2014 08:53    Assessment/Plan: The patient is doing well. He will likely go home tomorrow.  LOS: 0 days     Ethelda Deangelo D 01/18/2014, 4:26 PM

## 2014-01-18 NOTE — Progress Notes (Signed)
Pt remains on O2 @ 2L via nasal cannula at this time. O2 was taken off for a minute because pt requested for it to be removed.  O2 sat 97% and pt desated to 89% when taken off. O2 was placed back on. Pt with no activity at the time. Will continue to monitor.

## 2014-01-18 NOTE — Progress Notes (Signed)
Pt received at this time 11:20 am alert, verbal via bed. No noted distress. He denies pain or discomfort at this time. Assists with positioning to the bed assist x1.  IV to left wrist saline locked. Gauze dressing to surgical site dry and intact. SCD's on and in place. Pt educated and oriented to room. Safety measures in place.  He is on 2L of O2 via nasal cannula. O2 sat 94%. HOB elevated. He denies SOB or dyspnea. Call bell within reach.

## 2014-01-18 NOTE — Progress Notes (Signed)
Pt ambulated to the bathroom and back to bed standby assist with rolling walker. No noted distress. Will continue to monitor.

## 2014-01-18 NOTE — H&P (Signed)
Subjective: The patient is a 48 year old white male who has complained of left-sided chest pain and numbness. He had a cardiac workup which was negative. He was worked up with a thoracic MRI which demonstrated a herniated disc at T5-6 on the left. I discussed the various treatment options with the patient including surgery. He has weighed the risks, benefits, and alternative surgery decided proceed with a thoracic discectomy.   Past Medical History  Diagnosis Date  . DDD (degenerative disc disease), cervical   . Eosinophilic esophagitis     hx of- [facility]  . High triglycerides   . Low HDL (under 40)   . Diverticulitis   . Low testosterone   . Migraines   . Normal cardiac stress test     DOBUTAMINE STRESS ECHO 10 years ago   . OSA (obstructive sleep apnea)     no cpap used  . Pneumonia 10/15    no problems now  . Diabetes mellitus without complication   . History of kidney stones     hx  . GERD (gastroesophageal reflux disease)   . History of hiatal hernia   . Complication of anesthesia     low O2 sat after kidney surgery ~ 2009    Past Surgical History  Procedure Laterality Date  . Nasal sinus surgery      Removal polyps   . Kidney stone surgery      laser at [facility].   . Tonsillectomy and adenoidectomy    . Tympanostomy tube placement    . Esophageal dilation  07/18/11    Dr. Tora Duck  . Edg  01/03/12    Allergies  Allergen Reactions  . Niacin Other (See Comments)    unknown    History  Substance Use Topics  . Smoking status: Never Smoker   . Smokeless tobacco: Never Used  . Alcohol Use: No    Family History  Problem Relation Age of Onset  . Diabetes Maternal Grandmother   . Prostate cancer Father     oldr when dx    Prior to Admission medications   Medication Sig Start Date End Date Taking? Authorizing Provider  AMBULATORY NON FORMULARY MEDICATION Medication Name: Glucometer and strips and lancet to check once a day.  Dx 250.00 04/10/11  Yes  Hali Marry, MD  Esomeprazole Magnesium (NEXIUM PO) Take 22.3 mg by mouth daily.   Yes Historical Provider, MD  HYDROcodone-acetaminophen (NORCO) 10-325 MG per tablet Take 1 tablet by mouth every 6 (six) hours as needed (pain).  12/06/13  Yes Historical Provider, MD  lisinopril (PRINIVIL,ZESTRIL) 10 MG tablet TAKE 1 TABLET BY MOUTH DAILY.**NEEDS APPOINTMENT FOR FURTHER REFILLS** 12/21/13  Yes Hali Marry, MD  loratadine (CLARITIN REDITABS) 10 MG dissolvable tablet Take 10 mg by mouth daily.   Yes Historical Provider, MD  metFORMIN (GLUCOPHAGE) 500 MG tablet Take 500 mg by mouth daily with breakfast.   Yes Historical Provider, MD  amoxicillin-clavulanate (AUGMENTIN) 875-125 MG per tablet Take 1 tablet by mouth every 12 (twelve) hours. Take for 10 days. Take with food. Patient not taking: Reported on 01/11/2014 12/05/13   Jacqulyn Cane, MD  azithromycin (ZITHROMAX Z-PAK) 250 MG tablet Take 2 tablets on day one, then 1 tablet daily on days 2 through 5 Patient not taking: Reported on 01/11/2014 12/05/13   Jacqulyn Cane, MD  benzonatate (TESSALON) 200 MG capsule Take 1 every 8 hours as needed for cough. Patient not taking: Reported on 01/11/2014 12/05/13   Jacqulyn Cane, MD  cyclobenzaprine (FLEXERIL) 10 MG tablet One half tab PO qHS, then increase gradually to one tab TID. Patient not taking: Reported on 01/11/2014 10/20/13   Donella Stade, PA-C  meloxicam (MOBIC) 15 MG tablet One tab PO qAM with breakfast for 2 weeks, then daily prn pain. Patient not taking: Reported on 01/11/2014 07/27/13   Silverio Decamp, MD  metFORMIN (GLUCOPHAGE) 500 MG tablet TAKE 1 TABLET BY MOUTH TWICE DAILY WITH MEAL Patient not taking: Reported on 01/11/2014 10/23/13   Hali Marry, MD     Review of Systems  Positive ROS: As above  All other systems have been reviewed and were otherwise negative with the exception of those mentioned in the HPI and as above.  Objective: Vital signs in last  24 hours: Temp:  [98.4 F (36.9 C)] 98.4 F (36.9 C) (12/07 0602) Pulse Rate:  [74] 74 (12/07 0602) Resp:  [20] 20 (12/07 0602) BP: (154)/(95) 154/95 mmHg (12/07 0602) SpO2:  [97 %] 97 % (12/07 0602) Weight:  [111.131 kg (245 lb)] 111.131 kg (245 lb) (12/07 0602)  General Appearance: Alert, cooperative, no distress, Head: Normocephalic, without obvious abnormality, atraumatic Eyes: PERRL, conjunctiva/corneas clear, EOM's intact,    Ears: Normal  Throat: Normal  Neck: Supple, symmetrical, trachea midline, no adenopathy; thyroid: No enlargement/tenderness/nodules; no carotid bruit or JVD Back: Symmetric, no curvature, ROM normal, no CVA tenderness Lungs: Clear to auscultation bilaterally, respirations unlabored Heart: Regular rate and rhythm, no murmur, rub or gallop Abdomen: Soft, non-tender,, no masses, no organomegaly Extremities: Extremities normal, atraumatic, no cyanosis or edema Pulses: 2+ and symmetric all extremities Skin: Skin color, texture, turgor normal, no rashes or lesions  NEUROLOGIC:   Mental status: alert and oriented, no aphasia, good attention span, Fund of knowledge/ memory ok Motor Exam - grossly normal Sensory Exam - grossly normal Reflexes:  Coordination - grossly normal Gait - grossly normal Balance - grossly normal Cranial Nerves: I: smell Not tested  II: visual acuity  OS: Normal  OD: Normal   II: visual fields Full to confrontation  II: pupils Equal, round, reactive to light  III,VII: ptosis None  III,IV,VI: extraocular muscles  Full ROM  V: mastication Normal  V: facial light touch sensation  Normal  V,VII: corneal reflex  Present  VII: facial muscle function - upper  Normal  VII: facial muscle function - lower Normal  VIII: hearing Not tested  IX: soft palate elevation  Normal  IX,X: gag reflex Present  XI: trapezius strength  5/5  XI: sternocleidomastoid strength 5/5  XI: neck flexion strength  5/5  XII: tongue strength  Normal     Data Review Lab Results  Component Value Date   WBC 6.7 01/12/2014   HGB 15.7 01/12/2014   HCT 45.6 01/12/2014   MCV 82.8 01/12/2014   PLT 180 01/12/2014   Lab Results  Component Value Date   NA 141 01/12/2014   K 4.1 01/12/2014   CL 103 01/12/2014   CO2 25 01/12/2014   BUN 11 01/12/2014   CREATININE 1.02 01/12/2014   GLUCOSE 266* 01/12/2014   Lab Results  Component Value Date   INR 1.0 07/02/2007    Assessment/Plan: 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 I have reviewed his MRI scan with him and pointed out the abnormalities. We have discussed the various treatment options including surgery. I have described the surgical treatment option of a left T5-6 discectomy. I have shown him surgical models. We have  discussed the risks, benefits, alternatives, and likelihood of achieving our goal to surgery. I have answered all patient's questions. He has decided to proceed with surgery.   Brownie Nehme D 01/18/2014 7:21 AM

## 2014-01-19 ENCOUNTER — Encounter (HOSPITAL_COMMUNITY): Payer: Self-pay | Admitting: Neurosurgery

## 2014-01-19 DIAGNOSIS — M5114 Intervertebral disc disorders with radiculopathy, thoracic region: Secondary | ICD-10-CM | POA: Diagnosis not present

## 2014-01-19 LAB — GLUCOSE, CAPILLARY: GLUCOSE-CAPILLARY: 278 mg/dL — AB (ref 70–99)

## 2014-01-19 MED ORDER — MANAGING BACK PAIN BOOK
Freq: Once | Status: DC
  Filled 2014-01-19: qty 1

## 2014-01-19 MED ORDER — DIAZEPAM 5 MG PO TABS: 5.0000 mg | ORAL_TABLET | Freq: Four times a day (QID) | ORAL | Status: DC | PRN

## 2014-01-19 MED ORDER — DSS 100 MG PO CAPS: 100.0000 mg | ORAL_CAPSULE | Freq: Two times a day (BID) | ORAL | Status: DC

## 2014-01-19 MED ORDER — OXYCODONE-ACETAMINOPHEN 10-325 MG PO TABS: 1.0000 | ORAL_TABLET | ORAL | Status: DC | PRN

## 2014-01-19 NOTE — Progress Notes (Signed)
Physical Therapy Treatment Patient Details Name: Nicholas Christian MRN: [id number] DOB: 07/28/1965 Today's Date: 01/19/2014    History of Present Illness 48 y.o. male admitted to [facility] on 01/18/14 for elective T5-6 discectomy.  Pt with significant PMHx of DDD (cervical spine), and DM.     PT Comments    Patient progressing well and is Mod I with all mobility this AM. Educated safely and on taking it easy. Patient safe to D/C from a mobility standpoint based on progression towards goals set on PT eval.    Follow Up Recommendations  No PT follow up     Equipment Recommendations  None recommended by PT    Recommendations for Other Services       Precautions / Restrictions Precautions Precautions: Back Precaution Comments: Back precautions for comfort and to promote healing.      Mobility  Bed Mobility Overal bed mobility: Modified Independent                Transfers Overall transfer level: Modified independent                  Ambulation/Gait Ambulation/Gait assistance: Modified independent (Device/Increase time) Ambulation Distance (Feet): 600 Feet             Stairs Stairs: Yes Stairs assistance: Modified independent (Device/Increase time)   Number of Stairs: 12 General stair comments: no cues required  Wheelchair Mobility    Modified Rankin (Stroke Patients Only)       Balance                                    Cognition Arousal/Alertness: Awake/alert Behavior During Therapy: WFL for tasks assessed/performed Overall Cognitive Status: Within Functional Limits for tasks assessed                      Exercises      General Comments        Pertinent Vitals/Pain Pain Score: 5  Pain Location: back Pain Descriptors / Indicators: Sore Pain Intervention(s): Monitored during session    Home Living                      Prior Function            PT Goals (current goals can now be found in the care  plan section) Progress towards PT goals: Progressing toward goals    Frequency  Min 5X/week    PT Plan Current plan remains appropriate    Co-evaluation             End of Session   Activity Tolerance: Patient tolerated treatment well Patient left: in chair;with call bell/phone within reach     Time: 0736-0750 PT Time Calculation (min) (ACUTE ONLY): 14 min  Charges:  $Gait Training: 8-22 mins                    G Codes:      Jacqualyn Posey 01/19/2014, 8:01 AM  01/19/2014 Jacqualyn Posey PTA [phone] pager [phone] office

## 2014-01-19 NOTE — Discharge Summary (Signed)
Physician Discharge Summary  Patient ID: Nicholas Christian MRN: [id number] DOB/AGE: 47-03-1965 47 y.o.  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 The surgery went well.  The patient's postoperative course was unremarkable. He requested discharge to home on postoperative day #1. The patient was given oral and written discharge instructions. All his questions were answered.  Consults: None Significant Diagnostic Studies: None Treatments: Left T56 discectomy using microdissection Discharge Exam: Blood pressure 93/60, pulse 80, temperature 97.5 F (36.4 C), temperature source Oral, resp. rate 20, height 5' 5.5" (1.664 m), weight 111.131 kg (245 lb), SpO2 96 %. The patient is alert and pleasant. Looks well. His strength is grossly normal in his lower extremities.  Disposition: Home  Discharge Instructions    Call MD for:  difficulty breathing, headache or visual disturbances    Complete by:  As directed      Call MD for:  extreme fatigue    Complete by:  As directed      Call MD for:  hives    Complete by:  As directed      Call MD for:  persistant dizziness or light-headedness    Complete by:  As directed      Call MD for:  persistant nausea and vomiting    Complete by:  As directed      Call MD for:  redness, tenderness, or signs of infection (pain, swelling, redness, odor or green/yellow discharge around incision site)    Complete by:  As directed      Call MD for:  severe uncontrolled pain    Complete by:  As directed      Call MD for:  temperature >100.4    Complete by:  As directed      Diet - low sodium heart healthy    Complete by:  As directed      Discharge instructions    Complete by:  As  directed   Call [phone] for a followup appointment. Take a stool softener while you are using pain medications.     Driving Restrictions    Complete by:  As directed   Do not drive for 2 weeks.     Increase activity slowly    Complete by:  As directed      Lifting restrictions    Complete by:  As directed   Do not lift more than 5 pounds. No excessive bending or twisting.     May shower / Bathe    Complete by:  As directed   He may shower after the pain she is removed 3 days after surgery. Leave the incision alone.     Remove dressing in 48 hours    Complete by:  As directed   Your stitches are under the scan and will dissolve by themselves. The Steri-Strips will fall off after you take a few showers. Do not rub back or pick at the wound, Leave the wound alone.            Medication List    STOP taking these medications        cyclobenzaprine 10 MG tablet  Commonly known as:  FLEXERIL     HYDROcodone-acetaminophen 10-325 MG per tablet  Commonly known as:  NORCO  TAKE these medications        AMBULATORY NON FORMULARY MEDICATION  - Medication Name: Glucometer and strips and lancet to check once a day.   - Dx 250.00     amoxicillin-clavulanate 875-125 MG per tablet  Commonly known as:  AUGMENTIN  Take 1 tablet by mouth every 12 (twelve) hours. Take for 10 days. Take with food.     azithromycin 250 MG tablet  Commonly known as:  ZITHROMAX Z-PAK  Take 2 tablets on day one, then 1 tablet daily on days 2 through 5     benzonatate 200 MG capsule  Commonly known as:  TESSALON  Take 1 every 8 hours as needed for cough.     diazepam 5 MG tablet  Commonly known as:  VALIUM  Take 1 tablet (5 mg total) by mouth every 6 (six) hours as needed for muscle spasms.     DSS 100 MG Caps  Take 100 mg by mouth 2 (two) times daily.     lisinopril 10 MG tablet  Commonly known as:  PRINIVIL,ZESTRIL  TAKE 1 TABLET BY MOUTH DAILY.**NEEDS APPOINTMENT FOR FURTHER REFILLS**      loratadine 10 MG dissolvable tablet  Commonly known as:  CLARITIN REDITABS  Take 10 mg by mouth daily.     meloxicam 15 MG tablet  Commonly known as:  MOBIC  One tab PO qAM with breakfast for 2 weeks, then daily prn pain.     metFORMIN 500 MG tablet  Commonly known as:  GLUCOPHAGE  Take 500 mg by mouth daily with breakfast.     metFORMIN 500 MG tablet  Commonly known as:  GLUCOPHAGE  TAKE 1 TABLET BY MOUTH TWICE DAILY WITH MEAL     NEXIUM PO  Take 22.3 mg by mouth daily.     oxyCODONE-acetaminophen 10-325 MG per tablet  Commonly known as:  PERCOCET  Take 1 tablet by mouth every 4 (four) hours as needed for pain.         SignedOphelia Charter 01/19/2014, 7:40 AM

## 2014-01-19 NOTE — Progress Notes (Signed)
Patient ready for discharge.  Prescription for pain medication given to patient.  Education and paperwork complete. IV removed.   Kizzie Bane, RN

## 2014-01-27 ENCOUNTER — Ambulatory Visit (INDEPENDENT_AMBULATORY_CARE_PROVIDER_SITE_OTHER): Payer: BC Managed Care – PPO | Admitting: Family Medicine

## 2014-01-27 ENCOUNTER — Encounter: Payer: Self-pay | Admitting: Family Medicine

## 2014-01-27 ENCOUNTER — Ambulatory Visit (INDEPENDENT_AMBULATORY_CARE_PROVIDER_SITE_OTHER): Payer: BC Managed Care – PPO | Admitting: *Deleted

## 2014-01-27 VITALS — BP 123/86 | HR 79 | Temp 97.9°F | Wt 239.0 lb

## 2014-01-27 DIAGNOSIS — I10 Essential (primary) hypertension: Secondary | ICD-10-CM | POA: Diagnosis not present

## 2014-01-27 DIAGNOSIS — Z23 Encounter for immunization: Secondary | ICD-10-CM | POA: Diagnosis not present

## 2014-01-27 DIAGNOSIS — F411 Generalized anxiety disorder: Secondary | ICD-10-CM | POA: Diagnosis not present

## 2014-01-27 DIAGNOSIS — E119 Type 2 diabetes mellitus without complications: Secondary | ICD-10-CM

## 2014-01-27 LAB — POCT UA - MICROALBUMIN
Albumin/Creatinine Ratio, Urine, POC: <30
Creatinine, POC: 100 mg/dL
Microalbumin Ur, POC: 30 mg/L

## 2014-01-27 LAB — COMPLETE METABOLIC PANEL WITH GFR
ALT: 37 U/L (ref 0–53)
AST: 33 U/L (ref 0–37)
Albumin: 4.3 g/dL (ref 3.5–5.2)
Alkaline Phosphatase: 67 U/L (ref 39–117)
BILIRUBIN TOTAL: 0.8 mg/dL (ref 0.2–1.2)
BUN: 14 mg/dL (ref 6–23)
CO2: 25 meq/L (ref 19–32)
CREATININE: 0.85 mg/dL (ref 0.50–1.35)
Calcium: 9.5 mg/dL (ref 8.4–10.5)
Chloride: 103 mEq/L (ref 96–112)
GLUCOSE: 174 mg/dL — AB (ref 70–99)
Potassium: 4.6 mEq/L (ref 3.5–5.3)
Sodium: 138 mEq/L (ref 135–145)
TOTAL PROTEIN: 6.8 g/dL (ref 6.0–8.3)

## 2014-01-27 LAB — LIPID PANEL
CHOLESTEROL: 184 mg/dL (ref 0–200)
HDL: 33 mg/dL — ABNORMAL LOW (ref >39)
LDL Cholesterol: 120 mg/dL — ABNORMAL HIGH (ref 0–99)
TRIGLYCERIDES: 154 mg/dL — AB (ref <150)
Total CHOL/HDL Ratio: 5.6 Ratio
VLDL: 31 mg/dL (ref 0–40)

## 2014-01-27 LAB — POCT GLYCOSYLATED HEMOGLOBIN (HGB A1C): HEMOGLOBIN A1C: 8.1

## 2014-01-27 MED ORDER — METFORMIN HCL 1000 MG PO TABS: 1000.0000 mg | ORAL_TABLET | Freq: Two times a day (BID) | ORAL | Status: DC

## 2014-01-27 MED ORDER — LISINOPRIL 10 MG PO TABS: ORAL_TABLET | ORAL | Status: DC

## 2014-01-27 NOTE — Progress Notes (Signed)
   Subjective:    Patient ID: Nicholas Christian, male    DOB: 1965/10/04, 48 y.o.   MRN: [id number]  HPI Diabetes - no hypoglycemic events. No wounds or sores that are not healing well. No increased thirst or urination. Checking glucose at home. Taking medications as prescribed without any side effects. Says eye exam done at [facility].    Hypertension- Pt denies chest pain, SOB, dizziness, or heart palpitations.  Taking meds as directed w/o problems.  Denies medication side effects.    He does have back surgery about a week and a half ago for herniated thoracic disc. He is doing well overall. His postsurgical follow-up is January 12.  Generalized anxiety-he complains of feeling nervous and on edge several days of the week and difficulty controlling his worry more than half of the week. He also describes feeling restless and easily irritable.  Review of Systems     Objective:   Physical Exam  Constitutional: He is oriented to person, place, and time. He appears well-developed and well-nourished.  HENT:  Head: Normocephalic and atraumatic.  Cardiovascular: Normal rate, regular rhythm and normal heart sounds.   Pulmonary/Chest: Effort normal and breath sounds normal.  Neurological: He is alert and oriented to person, place, and time.  Skin: Skin is warm and dry.  Psychiatric: He has a normal mood and affect. His behavior is normal.          Assessment & Plan:  DM- Uncontrolled. Increase metformin to 1000 mg BID.    urine microalbumin performed today. Will defer foot exam till next time sine he is not able to bend over.  Encouraged him to be as active as he is able to and as much as the surgeon releases him to do. Make sure watching diet very tightly since he is not able to exercise normally.  HTN - Well controlled. Continue current regimen   Generalized anxiety, moderate-gad 7 score of 16. But he rates his symptoms as not difficult. He is not interested in tx at this time.   Given  flu shot.

## 2014-08-09 ENCOUNTER — Other Ambulatory Visit: Payer: Self-pay

## 2014-09-09 ENCOUNTER — Ambulatory Visit (INDEPENDENT_AMBULATORY_CARE_PROVIDER_SITE_OTHER): Payer: BLUE CROSS/BLUE SHIELD | Admitting: Family Medicine

## 2014-09-09 ENCOUNTER — Encounter: Payer: Self-pay | Admitting: Family Medicine

## 2014-09-09 VITALS — BP 137/94 | HR 76 | Wt 244.0 lb

## 2014-09-09 DIAGNOSIS — D239 Other benign neoplasm of skin, unspecified: Secondary | ICD-10-CM | POA: Diagnosis not present

## 2014-09-09 DIAGNOSIS — E1165 Type 2 diabetes mellitus with hyperglycemia: Secondary | ICD-10-CM

## 2014-09-09 DIAGNOSIS — I10 Essential (primary) hypertension: Secondary | ICD-10-CM | POA: Diagnosis not present

## 2014-09-09 DIAGNOSIS — D229 Melanocytic nevi, unspecified: Secondary | ICD-10-CM

## 2014-09-09 DIAGNOSIS — IMO0002 Reserved for concepts with insufficient information to code with codable children: Secondary | ICD-10-CM

## 2014-09-09 LAB — POCT GLYCOSYLATED HEMOGLOBIN (HGB A1C): HEMOGLOBIN A1C: 9.3

## 2014-09-09 MED ORDER — SITAGLIP PHOS-METFORMIN HCL ER 50-1000 MG PO TB24: 1.0000 | ORAL_TABLET | Freq: Every day | ORAL | Status: DC

## 2014-09-09 MED ORDER — LISINOPRIL 10 MG PO TABS: ORAL_TABLET | ORAL | Status: DC

## 2014-09-09 NOTE — Progress Notes (Signed)
   Subjective:    Patient ID: Nicholas Christian, male    DOB: 02/24/1965, 49 y.o.   MRN: [id number]  HPI Diabetes - no hypoglycemic events. No wounds or sores that are not healing well. No increased thirst or urination. Checking glucose at home. Taking medications as prescribed without any side effects. Not on metformin the last 2 weeks. Decreased dose to once a day bc of GI issues.    Lesion on left lower leg x 1 year.  Initially it was a red bump, then scabbed adn felt itchey and now it more gray and scaly.  No fever, chills. No trauma known.    Hypertension- Pt denies chest pain, SOB, dizziness, or heart palpitations.  Taking meds as directed w/o problems.  Denies medication side effects.  Ou tof BP  Med for 10 days.     Review of Systems     Objective:   Physical Exam  Constitutional: He is oriented to person, place, and time. He appears well-developed and well-nourished.  HENT:  Head: Normocephalic and atraumatic.  Cardiovascular: Normal rate, regular rhythm and normal heart sounds.   Pulmonary/Chest: Effort normal and breath sounds normal.  Neurological: He is alert and oriented to person, place, and time.  Skin: Skin is warm and dry.  Psychiatric: He has a normal mood and affect. His behavior is normal.          Assessment & Plan:  DM- uncontrolled diabetes. Discussed getting him back on the metformin and we will add Januvia. Was switched to Janumet X are since he has had some GI side effects with the metformin. This should help. Follow-up in 3 months.   HTN - not well controlled but has been out of medication for the last 10 days. Will refill medication. Follow back up in 3 months.  Skin lesion  - recommend shave biopsy for further evaluation.  Shave Biopsy Procedure Note  Pre-operative Diagnosis: Suspicious lesion  Post-operative Diagnosis: same  Locations:left lateral loewr leg  Indications: change in size and color  Anesthesia: Lidocaine 1% with epinephrine  without added sodium bicarbonate  Procedure Details  History of allergy to iodine: no  Patient informed of the risks (including bleeding and infection) and benefits of the  procedure and Verbal informed consent obtained.  The lesion and surrounding area were given a sterile prep using chlorhexidine and draped in the usual sterile fashion. A scalpel was used to shave an area of skin approximately 72mm by 11mm.  Hemostasis achieved with alumuninum chloride. Antibiotic ointment and a sterile dressing applied.  The specimen was sent for pathologic examination. The patient tolerated the procedure well.  EBL: 0 ml  Findings: awith pathology   Condition: Stable  Complications: none.  Plan: 1. Instructed to keep the wound dry and covered for 24-48h and clean thereafter. 2. Warning signs of infection were reviewed.   3. Recommended that the patient use OTC acetaminophen as needed for pain.  4. Return in PRN.

## 2014-09-09 NOTE — Addendum Note (Signed)
Addended by: Narda Rutherford on: 09/09/2014 04:40 PM   Modules accepted: Orders

## 2014-09-16 ENCOUNTER — Encounter: Payer: Self-pay | Admitting: Family Medicine

## 2014-10-11 LAB — HM DIABETES EYE EXAM

## 2014-12-10 ENCOUNTER — Encounter: Payer: Self-pay | Admitting: Family Medicine

## 2014-12-10 ENCOUNTER — Ambulatory Visit (INDEPENDENT_AMBULATORY_CARE_PROVIDER_SITE_OTHER): Payer: BLUE CROSS/BLUE SHIELD

## 2014-12-10 ENCOUNTER — Ambulatory Visit (INDEPENDENT_AMBULATORY_CARE_PROVIDER_SITE_OTHER): Payer: BLUE CROSS/BLUE SHIELD | Admitting: Family Medicine

## 2014-12-10 VITALS — BP 131/71 | HR 67 | Temp 98.2°F | Ht 66.0 in | Wt 239.0 lb

## 2014-12-10 DIAGNOSIS — M25552 Pain in left hip: Secondary | ICD-10-CM

## 2014-12-10 DIAGNOSIS — D229 Melanocytic nevi, unspecified: Secondary | ICD-10-CM

## 2014-12-10 DIAGNOSIS — IMO0001 Reserved for inherently not codable concepts without codable children: Secondary | ICD-10-CM

## 2014-12-10 DIAGNOSIS — D239 Other benign neoplasm of skin, unspecified: Secondary | ICD-10-CM

## 2014-12-10 DIAGNOSIS — E1165 Type 2 diabetes mellitus with hyperglycemia: Secondary | ICD-10-CM

## 2014-12-10 DIAGNOSIS — Z23 Encounter for immunization: Secondary | ICD-10-CM

## 2014-12-10 DIAGNOSIS — I10 Essential (primary) hypertension: Secondary | ICD-10-CM | POA: Diagnosis not present

## 2014-12-10 DIAGNOSIS — Z114 Encounter for screening for human immunodeficiency virus [HIV]: Secondary | ICD-10-CM

## 2014-12-10 LAB — POCT GLYCOSYLATED HEMOGLOBIN (HGB A1C): Hemoglobin A1C: 7.9

## 2014-12-10 LAB — POCT UA - MICROALBUMIN
CREATININE, POC: 200 mg/dL
Microalbumin Ur, POC: 30 mg/L

## 2014-12-10 MED ORDER — LISINOPRIL 20 MG PO TABS: 20.0000 mg | ORAL_TABLET | Freq: Every day | ORAL | Status: DC

## 2014-12-10 MED ORDER — AMBULATORY NON FORMULARY MEDICATION: Status: AC

## 2014-12-10 NOTE — Progress Notes (Signed)
   Subjective:    Patient ID: Nicholas Christian, male    DOB: Nov 05, 1965, 49 y.o.   MRN: [id number]  HPI Diabetes - no hypoglycemic events. No wounds or sores that are not healing well. No increased thirst or urination. Checking glucose at home. Taking medications as prescribed without any side effects.  Hypertension- Pt denies chest pain, SOB, dizziness, or heart palpitations.  Taking meds as directed w/o problems.  Denies medication side effects.  He has started running 6 weeks ago. He has been more stressed in the last 6 weeks. Has ben having more headaches as well.    Has a bump on the inside of his nose.  Says he "operates on it" and gets a small white ball out.  Then is comes back at times. Marland Kitchen   Has been having some left hip pain over the groin crease.  Really painful at the beginning of the run and then gets better.  Has custom orthotics. He started running in 6 weeks ago. He says it doesn't really hurt him to move his hip around if he is just sitting or resting. He denies any known injury. No prior history of gastric arthritis of the hip as far as he is aware. No recent low back pain.    Review of Systems     Objective:   Physical Exam  Constitutional: He is oriented to person, place, and time. He appears well-developed and well-nourished.  HENT:  Head: Normocephalic and atraumatic.  Cardiovascular: Normal rate, regular rhythm and normal heart sounds.   Pulmonary/Chest: Effort normal and breath sounds normal.  Abdominal: Soft. Bowel sounds are normal. He exhibits no distension and no mass. There is no guarding.  Musculoskeletal:  Left hip with normal flexion and extension. No difficulty or pain with external/internal rotation. No recurrence of pain with resisted flexion. That he did have pain with using the abdominal sitting up.  Neurological: He is alert and oriented to person, place, and time.  Skin: Skin is warm and dry.  Psychiatric: He has a normal mood and affect. His behavior  is normal.          Assessment & Plan:  DM- Much improved.  F/u in 3 months. Continue to work on diet and exercise. I think if he continues his running in his A1c will be under 7 at the next office visit.  HTN- not well controlled.  Will inc lisinopril to 20mg . F/U in 3 months.   Left hip pain - dicussed possibilities including rectus abdominus pain vs sports hernia vs OA of the hip. Hip flexor tendonitis is less likely . Will get xray and will call with results.  The spot that he's describing on his nose is most consistent with a milia. Right now it seems to have resolved but if it recurs or be happy to see him back and we can consider cauterizing the base that it doesn't recur.

## 2014-12-11 LAB — LIPID PANEL
CHOL/HDL RATIO: 5.2 ratio — AB (ref <=5.0)
CHOLESTEROL: 160 mg/dL (ref 125–200)
HDL: 31 mg/dL — AB (ref >=40)
LDL CALC: 101 mg/dL (ref <130)
TRIGLYCERIDES: 139 mg/dL (ref <150)
VLDL: 28 mg/dL (ref <30)

## 2014-12-11 LAB — COMPLETE METABOLIC PANEL WITH GFR
ALT: 49 U/L — AB (ref 9–46)
AST: 37 U/L (ref 10–40)
Albumin: 4.3 g/dL (ref 3.6–5.1)
Alkaline Phosphatase: 59 U/L (ref 40–115)
BUN: 18 mg/dL (ref 7–25)
CALCIUM: 9.1 mg/dL (ref 8.6–10.3)
CHLORIDE: 105 mmol/L (ref 98–110)
CO2: 26 mmol/L (ref 20–31)
CREATININE: 1.09 mg/dL (ref 0.60–1.35)
GFR, EST NON AFRICAN AMERICAN: 80 mL/min (ref >=60)
Glucose, Bld: 99 mg/dL (ref 65–99)
POTASSIUM: 4.3 mmol/L (ref 3.5–5.3)
SODIUM: 141 mmol/L (ref 135–146)
TOTAL PROTEIN: 6.6 g/dL (ref 6.1–8.1)
Total Bilirubin: 1.1 mg/dL (ref 0.2–1.2)

## 2014-12-11 LAB — HIV ANTIBODY (ROUTINE TESTING W REFLEX): HIV: NONREACTIVE

## 2015-03-14 ENCOUNTER — Ambulatory Visit (INDEPENDENT_AMBULATORY_CARE_PROVIDER_SITE_OTHER): Payer: BLUE CROSS/BLUE SHIELD | Admitting: Family Medicine

## 2015-03-14 ENCOUNTER — Encounter: Payer: Self-pay | Admitting: Family Medicine

## 2015-03-14 VITALS — BP 131/91 | HR 66 | Wt 240.0 lb

## 2015-03-14 DIAGNOSIS — IMO0001 Reserved for inherently not codable concepts without codable children: Secondary | ICD-10-CM

## 2015-03-14 DIAGNOSIS — E1165 Type 2 diabetes mellitus with hyperglycemia: Secondary | ICD-10-CM | POA: Diagnosis not present

## 2015-03-14 DIAGNOSIS — L03011 Cellulitis of right finger: Secondary | ICD-10-CM | POA: Diagnosis not present

## 2015-03-14 DIAGNOSIS — I10 Essential (primary) hypertension: Secondary | ICD-10-CM

## 2015-03-14 DIAGNOSIS — S61209A Unspecified open wound of unspecified finger without damage to nail, initial encounter: Secondary | ICD-10-CM

## 2015-03-14 LAB — POCT GLYCOSYLATED HEMOGLOBIN (HGB A1C): HEMOGLOBIN A1C: 8.6

## 2015-03-14 MED ORDER — DULAGLUTIDE 0.75 MG/0.5ML ~~LOC~~ SOAJ: 0.7500 mg | SUBCUTANEOUS | Status: DC

## 2015-03-14 NOTE — Progress Notes (Signed)
   Subjective:    Patient ID: Nicholas Christian, male    DOB: [birth year], 50 y.o.   MRN: [id number]  HPI Diabetes - no hypoglycemic events. No wounds or sores that are not healing well. No increased thirst or urination. Checking glucose at home. Taking medications as prescribed without any side effects.  Admits he is not dieting   Hypertension- Pt denies chest pain, SOB, dizziness, or heart palpitations.  Taking meds as directed w/o problems.  Denies medication side effects.    He also reports some pain and redness on the first finger of his right hand. It is been there for a couple of weeks. He wants to know if it's safe to "poke a hole" in it on its on his own. No fevers chills or sweats.   Review of Systems     Objective:   Physical Exam  Constitutional: He is oriented to person, place, and time. He appears well-developed and well-nourished.  HENT:  Head: Normocephalic and atraumatic.  Cardiovascular: Normal rate, regular rhythm and normal heart sounds.   Pulmonary/Chest: Effort normal and breath sounds normal.  Neurological: He is alert and oriented to person, place, and time.  Skin: Skin is warm and dry.  Psychiatric: He has a normal mood and affect. His behavior is normal.   On the first right finger on the lateral border he has significant erythema and induration. The erythema extends partly 1 cm from the border of the nail.       Assessment & Plan:  DM- Uncontrolled.  We discussed several options. Will start with Trulicity. Warned about potential side effects of the medication. Demonstrated how to use the device her in the office. Coupon card provided. Follow-up in 3 months.  HTN - repeat blood pressure improved but diastolic still mildly elevated. I really want to work with diet and exercise with him over the next 3 months to get this under better control. If not we'll need to adjust his blood pressure regimen.  Paronychia, right first finger - I&D performed. Patient  tolerated well. Wound culture collected. Will call for results once available.   Incision and Drainage Procedure Note  Pre-operative Diagnosis: paronychia  Post-operative Diagnosis: same  Indications: infection  Anesthesia: ethyl chloride spray  Procedure Details  The procedure, risks and complications have been discussed in detail (including, but not limited to airway compromise, infection, bleeding) with the patient, and the patient has signed consent to the procedure.  The skin was sterilely prepped and draped over the affected area in the usual fashion. After adequate local anesthesia, I&D with a #11 blade was performed on the right first finger. Purulent drainage: present The patient was observed until stable.  Findings: Await culture  EBL: 0 cc's  Condition: Tolerated procedure well   Complications: none.

## 2015-03-17 LAB — WOUND CULTURE: Gram Stain: NONE SEEN

## 2015-03-18 ENCOUNTER — Other Ambulatory Visit: Payer: Self-pay | Admitting: Family Medicine

## 2015-05-05 ENCOUNTER — Encounter: Payer: Self-pay | Admitting: Family Medicine

## 2015-05-05 ENCOUNTER — Ambulatory Visit (INDEPENDENT_AMBULATORY_CARE_PROVIDER_SITE_OTHER): Payer: BLUE CROSS/BLUE SHIELD | Admitting: Family Medicine

## 2015-05-05 VITALS — BP 120/90 | HR 98 | Temp 98.2°F | Wt 232.0 lb

## 2015-05-05 DIAGNOSIS — R197 Diarrhea, unspecified: Secondary | ICD-10-CM | POA: Diagnosis not present

## 2015-05-05 DIAGNOSIS — R1032 Left lower quadrant pain: Secondary | ICD-10-CM

## 2015-05-05 MED ORDER — METRONIDAZOLE 500 MG PO TABS: 500.0000 mg | ORAL_TABLET | Freq: Three times a day (TID) | ORAL | Status: DC

## 2015-05-05 MED ORDER — CIPROFLOXACIN HCL 500 MG PO TABS: 500.0000 mg | ORAL_TABLET | Freq: Two times a day (BID) | ORAL | Status: DC

## 2015-05-05 NOTE — Patient Instructions (Addendum)
Thank you for coming in today. We will do labs and Ct scan.  Bring back the stool studies to the lab.  We will contact you ASAP with results.  Start taking cipro and flagyl after you are able to provide a stool sample.   Clostridium Difficile Infection Clostridium difficile (C. difficile or C. diff) is a bacterium normally found in the intestinal tract or colon. C. difficile infection causes diarrhea and sometimes a severe disease called pseudomembranous colitis (C. difficile colitis). C. difficile colitis can damage the lining of the colon or cause the colon to become very large (toxic megacolon). Older adults and people with certain medical conditions have a greater risk of getting C. difficile infections. CAUSES The balance of bacteria in your colon can change when you are sick, especially when taking antibiotic medicine. Taking antibiotics may allow the C. difficile to grow, multiply, and make a toxin that causes C. difficile infection.  SYMPTOMS  Diarrhea.  Fever.  Fatigue.  Loss of appetite.  Nausea.  Abdominal swelling, pain, or tenderness.  Dehydration. DIAGNOSIS Your health care provider may suspect C. difficile infection based on your symptoms and if you have taken antibiotics recently. Your health care provider may also order:  A lab test that can detect the toxin in your stool.  A sigmoidoscopy or colonoscopy to look at the appearance of your colon. These procedures involve passing an instrument through your rectum to look at the inside of your colon. Your health care provider will help determine if these tests are necessary. TREATMENT Treatment may include:  Taking antibiotics that keep C. difficile from growing.  Stopping the antibiotics you were on before the C. difficile infection began. Only do this if instructed to do so by your health care provider.  IV fluids and correction of electrolyte imbalance.  Surgery to remove the infected part of the intestines.  This is rare. HOME CARE INSTRUCTIONS  Drink enough fluids to keep your urine clear or pale yellow. Avoid milk, caffeine, and alcohol.  Ask your health care provider for specific rehydration instructions.  Eat small, frequent meals rather than large meals.  Take your antibiotics as directed. Finish them even if you start to feel better.  Do not use medicines to slow diarrhea. This could delay healing or cause problems.  Wash your hands thoroughly after using the bathroom and before preparing food. Make sure people who live with you wash their hands often, too.  Clean all surfaces with a product that contains chlorine bleach. SEEK MEDICAL CARE IF:  Your diarrhea lasts longer than expected or comes back after you finish your antibiotic medicine for the C. difficile infection.  You have trouble staying hydrated.  You have a fever. SEEK IMMEDIATE MEDICAL CARE IF:  You have increasing abdominal pain or tenderness.  You have blood in your stools, or your stools look dark black and tarry.  You cannot eat or drink without vomiting.   This information is not intended to replace advice given to you by your health care provider. Make sure you discuss any questions you have with your health care provider.   Document Released: 11/08/2004 Document Revised: 02/19/2014 Document Reviewed: 08/02/2014 Elsevier Interactive Patient Education [employer].

## 2015-05-05 NOTE — Progress Notes (Signed)
Nicholas Christian is a 50 y.o. male who presents to [facility]: [facility] today for abdominal pain and diarrhea.  Patient notes a 2 month history of diarrhea associated with left lower quadrant abdominal pain. Patient denies any blood in his stool. He notes the diarrhea is intermittent and watery. He denies any fevers or chills or vomiting. He notes the pain is mildly consistent with previous episodes of diverticulitis. He denies any camping or recent antibiotic exposures. He has tried some over-the-counter antidiarrheal medicines that have helped a bit.   Past Medical History  Diagnosis Date  . DDD (degenerative disc disease), cervical   . Eosinophilic esophagitis     hx of- [facility]  . High triglycerides   . Low HDL (under 40)   . Diverticulitis   . Low testosterone   . Migraines   . Normal cardiac stress test     DOBUTAMINE STRESS ECHO 10 years ago   . OSA (obstructive sleep apnea)     no cpap used  . Pneumonia 10/15    no problems now  . Diabetes mellitus without complication ([place])   . History of kidney stones     hx  . GERD (gastroesophageal reflux disease)   . History of hiatal hernia   . Complication of anesthesia     low O2 sat after kidney surgery ~ 2009   Past Surgical History  Procedure Laterality Date  . Nasal sinus surgery      Removal polyps   . Kidney stone surgery      laser at [facility].   . Tonsillectomy and adenoidectomy    . Tympanostomy tube placement    . Esophageal dilation  07/18/11    Dr. Tora Duck  . Edg  01/03/12  . Lumbar laminectomy/decompression microdiscectomy Left 01/18/2014    Procedure: Left Thoracic five-six microdiskectomy;  Surgeon: Newman Pies, MD;  Location: [facility];  Service: Neurosurgery;  Laterality: Left;   Social History  Substance Use Topics  . Smoking status: Never Smoker   . Smokeless tobacco: Never Used  . Alcohol  Use: No   family history includes Diabetes in his maternal grandmother; Prostate cancer in his father.  ROS as above Medications: Current Outpatient Prescriptions  Medication Sig Dispense Refill  . AMBULATORY NON FORMULARY MEDICATION Medication Name: Glucometer and strips and lancet to check once a day.  Dx 250.00 1 Units 11  . Dulaglutide (TRULICITY) A999333 0000000 SOPN Inject 0.75 mg into the skin once a week. 4 pen 5  . esomeprazole (NEXIUM) 40 MG capsule TAKE 1 CAPSULE BY MOUTH DAILY    . JANUMET XR 50-1000 MG TB24 TAKE 1 TABLET BY MOUTH DAILY 90 tablet 0  . lisinopril (PRINIVIL,ZESTRIL) 20 MG tablet Take 1 tablet (20 mg total) by mouth daily. TAKE 1 TABLET BY MOUTH DAILY. 90 tablet 1  . ciprofloxacin (CIPRO) 500 MG tablet Take 1 tablet (500 mg total) by mouth 2 (two) times daily. 14 tablet 0  . metroNIDAZOLE (FLAGYL) 500 MG tablet Take 1 tablet (500 mg total) by mouth 3 (three) times daily. 21 tablet 0   No current facility-administered medications for this visit.   Allergies  Allergen Reactions  . Niacin Other (See Comments)    unknown     Exam:  BP 120/90 mmHg  Pulse 98  Temp(Src) 98.2 F (36.8 C) (Oral)  Wt 232 lb (105.235 kg) Gen: Well NAD Nontoxic appearing HEENT: EOMI,  MMM Lungs: Normal  work of breathing. CTABL Heart: RRR no MRG Abd: NABS, Soft. Nondistended, mildly tender to palpation left lower quadrant without any masses rebound or guarding. Exts: Brisk capillary refill, warm and well perfused.     No results found for this or any previous visit (from the past 24 hour(s)). No results found.   50 year old male with left lower quadrant abdominal pain and diarrhea. Concerning for multiple etiologies including diverticulitis, inflammatory bowel disease, infectious colitis, and IBS. Start workup with CBC CMP and lipase. Additionally obtain stool studies including stool culture C. difficile PCR, and ova and parasites examination. Additionally obtain CT scan of  abdomen and pelvis for diverticulitis or colitis evaluation. Additionally treat empirically with Cipro and Flagyl after patient provides a stool sample.

## 2015-05-06 ENCOUNTER — Ambulatory Visit (INDEPENDENT_AMBULATORY_CARE_PROVIDER_SITE_OTHER): Payer: BLUE CROSS/BLUE SHIELD

## 2015-05-06 DIAGNOSIS — R1032 Left lower quadrant pain: Secondary | ICD-10-CM

## 2015-05-06 DIAGNOSIS — K5790 Diverticulosis of intestine, part unspecified, without perforation or abscess without bleeding: Secondary | ICD-10-CM | POA: Diagnosis not present

## 2015-05-06 DIAGNOSIS — R197 Diarrhea, unspecified: Secondary | ICD-10-CM

## 2015-05-06 LAB — COMPREHENSIVE METABOLIC PANEL
ALK PHOS: 54 U/L (ref 40–115)
ALT: 40 U/L (ref 9–46)
AST: 26 U/L (ref 10–40)
Albumin: 4.4 g/dL (ref 3.6–5.1)
BILIRUBIN TOTAL: 1.1 mg/dL (ref 0.2–1.2)
BUN: 16 mg/dL (ref 7–25)
CALCIUM: 8.8 mg/dL (ref 8.6–10.3)
CO2: 26 mmol/L (ref 20–31)
CREATININE: 1.34 mg/dL (ref 0.60–1.35)
Chloride: 103 mmol/L (ref 98–110)
GLUCOSE: 116 mg/dL — AB (ref 65–99)
Potassium: 4.4 mmol/L (ref 3.5–5.3)
SODIUM: 140 mmol/L (ref 135–146)
Total Protein: 6.9 g/dL (ref 6.1–8.1)

## 2015-05-06 LAB — CBC
HEMATOCRIT: 48.6 % (ref 39.0–52.0)
Hemoglobin: 16.5 g/dL (ref 13.0–17.0)
MCH: 27.6 pg (ref 26.0–34.0)
MCHC: 34 g/dL (ref 30.0–36.0)
MCV: 81.4 fL (ref 78.0–100.0)
MPV: 10.8 fL (ref 8.6–12.4)
Platelets: 199 10*3/uL (ref 150–400)
RBC: 5.97 MIL/uL — ABNORMAL HIGH (ref 4.22–5.81)
RDW: 14.2 % (ref 11.5–15.5)
WBC: 10.9 10*3/uL — AB (ref 4.0–10.5)

## 2015-05-06 LAB — OVA AND PARASITE EXAMINATION: OP: NONE SEEN

## 2015-05-06 LAB — LIPASE: Lipase: 33 U/L (ref 7–60)

## 2015-05-06 LAB — CLOSTRIDIUM DIFFICILE BY PCR: CDIFFPCR: DETECTED — AB

## 2015-05-06 MED ORDER — IOPAMIDOL (ISOVUE-300) INJECTION 61%: 100.0000 mL | Freq: Once | INTRAVENOUS | Status: DC | PRN

## 2015-05-06 NOTE — Progress Notes (Signed)
Quick Note:  Labs are largely normal right now. We are waiting on stool results and CT scan results. ______

## 2015-05-06 NOTE — Progress Notes (Signed)
Quick Note:  CT scan of the abdomen was largely normal. Continue current plan. Follow up next week. ______

## 2015-05-09 ENCOUNTER — Encounter: Payer: Self-pay | Admitting: Family Medicine

## 2015-05-09 DIAGNOSIS — Z8619 Personal history of other infectious and parasitic diseases: Secondary | ICD-10-CM | POA: Insufficient documentation

## 2015-05-09 LAB — STOOL CULTURE

## 2015-05-09 NOTE — Progress Notes (Signed)
Quick Note:  Abdominal pain and diarrhea is caused by an infection with the bacteria C. Diff. STOP cipro. Continue metronidazole three times daily. ______

## 2015-06-10 ENCOUNTER — Telehealth: Payer: Self-pay | Admitting: *Deleted

## 2015-06-10 ENCOUNTER — Ambulatory Visit (INDEPENDENT_AMBULATORY_CARE_PROVIDER_SITE_OTHER): Payer: BLUE CROSS/BLUE SHIELD | Admitting: Family Medicine

## 2015-06-10 ENCOUNTER — Encounter: Payer: Self-pay | Admitting: Family Medicine

## 2015-06-10 VITALS — BP 116/59 | HR 80 | Wt 230.0 lb

## 2015-06-10 DIAGNOSIS — A0472 Enterocolitis due to Clostridium difficile, not specified as recurrent: Secondary | ICD-10-CM

## 2015-06-10 DIAGNOSIS — IMO0001 Reserved for inherently not codable concepts without codable children: Secondary | ICD-10-CM

## 2015-06-10 DIAGNOSIS — I10 Essential (primary) hypertension: Secondary | ICD-10-CM

## 2015-06-10 DIAGNOSIS — E1165 Type 2 diabetes mellitus with hyperglycemia: Secondary | ICD-10-CM | POA: Diagnosis not present

## 2015-06-10 DIAGNOSIS — K222 Esophageal obstruction: Secondary | ICD-10-CM

## 2015-06-10 DIAGNOSIS — A047 Enterocolitis due to Clostridium difficile: Secondary | ICD-10-CM

## 2015-06-10 LAB — POCT GLYCOSYLATED HEMOGLOBIN (HGB A1C): HEMOGLOBIN A1C: 6.5

## 2015-06-10 NOTE — Telephone Encounter (Signed)
Prior auth submitted through covermymeds Key # [id number]

## 2015-06-10 NOTE — Progress Notes (Signed)
Subjective:    Patient ID: Nicholas Christian, male    DOB: 28-Apr-1965, 50 y.o.   MRN: [id number]  HPI Diabetes - no hypoglycemic events. No wounds or sores that are not healing well. No increased thirst or urination. Checking glucose at home. Taking medications as prescribed without any side effects.He started Trulicity since out last saw him. It did require a prior authorization from his insurance. He has lost 10 lbs.   Hypertension- Pt denies chest pain, SOB, dizziness, or heart palpitations.  Taking meds as directed w/o problems.  Denies medication side effects.    He was also recently diagnosed with C. difficile colitis about 4 weeks ago and completed the treatment for Cipro. He says he is actually done really well since his treatment until yesterday where he had an episode of diarrhea and then later in the day started having uncontrollable burping that has a sulfa smell which is very similar to the symptoms that he was having initially when he came in and presented a month ago. He has not had another episode of diarrhea since yesterday morning. He's also had a little bit is still discomfort in the left lower quadrant.  He also thinks he may need to be referred back to GI for an esophageal dilatation. He has a prior history of her stricture and he thinks the last time he had a dilated was about 3 or 4 years ago and he started to notice food getting hung again. In fact last night he had to force himself to vomit to get the food dislodged.   Review of Systems BP 116/59 mmHg  Pulse 80  Wt 230 lb (104.327 kg)  SpO2 100%    Allergies  Allergen Reactions  . Niacin Other (See Comments)    unknown    Past Medical History  Diagnosis Date  . DDD (degenerative disc disease), cervical   . Eosinophilic esophagitis     hx of- [facility]  . High triglycerides   . Low HDL (under 40)   . Diverticulitis   . Low testosterone   . Migraines   . Normal cardiac stress test     DOBUTAMINE STRESS ECHO  10 years ago   . OSA (obstructive sleep apnea)     no cpap used  . Pneumonia 10/15    no problems now  . Diabetes mellitus without complication ([place])   . History of kidney stones     hx  . GERD (gastroesophageal reflux disease)   . History of hiatal hernia   . Complication of anesthesia     low O2 sat after kidney surgery ~ 2009    Past Surgical History  Procedure Laterality Date  . Nasal sinus surgery      Removal polyps   . Kidney stone surgery      laser at [facility].   . Tonsillectomy and adenoidectomy    . Tympanostomy tube placement    . Esophageal dilation  07/18/11    Dr. Tora Duck  . Edg  01/03/12  . Lumbar laminectomy/decompression microdiscectomy Left 01/18/2014    Procedure: Left Thoracic five-six microdiskectomy;  Surgeon: Newman Pies, MD;  Location: [place] [facility];  Service: Neurosurgery;  Laterality: Left;    Social History   Social History  . Marital Status: Married    Spouse Name: N/A  . Number of Children: 3  . Years of Education: N/A   Occupational History  . Self employed      Government social research officer.  Social History Main Topics  . Smoking status: Never Smoker   . Smokeless tobacco: Never Used  . Alcohol Use: No  . Drug Use: No  . Sexual Activity:    Partners: Female   Other Topics Concern  . Not on file   Social History Narrative   No regular exercise. 1 caffeine /soda per day.     Family History  Problem Relation Age of Onset  . Diabetes Maternal Grandmother   . Prostate cancer Father     oldr when dx     Outpatient Encounter Prescriptions as of 06/10/2015  Medication Sig  . AMBULATORY NON FORMULARY MEDICATION Medication Name: Glucometer and strips and lancet to check once a day.  Dx 250.00  . Dulaglutide (TRULICITY) A999333 0000000 SOPN Inject 0.75 mg into the skin once a week.  . esomeprazole (NEXIUM) 40 MG capsule TAKE 1 CAPSULE BY MOUTH DAILY  . JANUMET XR 50-1000 MG TB24 TAKE 1 TABLET BY MOUTH DAILY  . lisinopril  (PRINIVIL,ZESTRIL) 20 MG tablet Take 1 tablet (20 mg total) by mouth daily. TAKE 1 TABLET BY MOUTH DAILY.  . [DISCONTINUED] ciprofloxacin (CIPRO) 500 MG tablet Take 1 tablet (500 mg total) by mouth 2 (two) times daily.  . [DISCONTINUED] metroNIDAZOLE (FLAGYL) 500 MG tablet Take 1 tablet (500 mg total) by mouth 3 (three) times daily.   Facility-Administered Encounter Medications as of 06/10/2015  Medication  . iopamidol (ISOVUE-300) 61 % injection 100 mL          Objective:   Physical Exam  Constitutional: He is oriented to person, place, and time. He appears well-developed and well-nourished.  HENT:  Head: Normocephalic and atraumatic.  Cardiovascular: Normal rate, regular rhythm and normal heart sounds.   Pulmonary/Chest: Effort normal and breath sounds normal.  Abdominal: Soft. Bowel sounds are normal. He exhibits no distension and no mass. There is tenderness. There is no rebound and no guarding.  Mild tenderness in the left lower quadrant with no rebound or guarding.  Neurological: He is alert and oriented to person, place, and time.  Skin: Skin is warm and dry.  Psychiatric: He has a normal mood and affect. His behavior is normal.          Assessment & Plan:  DM - well controlled.  A1c looks fantastic at 6.5 today. Significant improvement from previous. Continue current regimen and follow-up in 3 months. Looks fantastic. He also plans on getting back on track with running for exercise. Lab Results  Component Value Date   HGBA1C 8.6 03/14/2015   HTN- Well controlled. Continue current regimen. Follow up in 3 mo.    C. difficile colitis-he has had one episode of diarrhea that started yesterday it as well as some uncontrollable belching. Unclear if this is a recurrence or not. He doesn't want to be retested at this point. So we discussed getting on Zantac 300 milligrams twice a day through the weekend and seeing if his symptoms seem to improve. If they persist encouraged him to  call the office back and consider repeat treatment.  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 gastroneurology which is where he was previously treated for recommendation for possible repeat esophageal dilatation. Last procedure was done in November 2013.

## 2015-06-10 NOTE — Patient Instructions (Signed)
Increase Zantac. Take 300 mg twice a day about 15-20 minutes before first meal and last meal of the day. Encouraged her to take this through the weekend and see if this helps control her symptoms. If you continue to have diarrhea particularly watery diarrhea over the weekend and give [place] a call Monday or Tuesday.

## 2015-06-13 NOTE — Telephone Encounter (Signed)
Pt informed that PA has been submitted and we are awaiting to hear back.Nicholas Christian [place]

## 2015-06-14 NOTE — Telephone Encounter (Signed)
trulicity was denied because patient has not tried Victoza. Would you be willing to write for Victoza?

## 2015-06-15 DIAGNOSIS — R634 Abnormal weight loss: Secondary | ICD-10-CM | POA: Diagnosis not present

## 2015-06-15 DIAGNOSIS — R1314 Dysphagia, pharyngoesophageal phase: Secondary | ICD-10-CM | POA: Diagnosis not present

## 2015-06-15 DIAGNOSIS — R142 Eructation: Secondary | ICD-10-CM | POA: Diagnosis not present

## 2015-06-15 DIAGNOSIS — R197 Diarrhea, unspecified: Secondary | ICD-10-CM | POA: Diagnosis not present

## 2015-06-15 MED ORDER — LIRAGLUTIDE 18 MG/3ML ~~LOC~~ SOPN: 0.6000 mg | PEN_INJECTOR | Freq: Every day | SUBCUTANEOUS | Status: DC

## 2015-06-15 NOTE — Telephone Encounter (Signed)
New rx sent for Vitoza.  Please let pt know it will be a daily injection instead of once a week.

## 2015-06-15 NOTE — Telephone Encounter (Signed)
Patient notified

## 2015-06-18 ENCOUNTER — Other Ambulatory Visit: Payer: Self-pay | Admitting: Family Medicine

## 2015-06-20 ENCOUNTER — Telehealth: Payer: Self-pay | Admitting: *Deleted

## 2015-06-20 NOTE — Telephone Encounter (Signed)
Initiated a [place] for [employer]: [id number]

## 2015-06-27 ENCOUNTER — Other Ambulatory Visit: Payer: Self-pay | Admitting: Family Medicine

## 2015-06-27 MED ORDER — PEN NEEDLES 31G X 8 MM MISC: Status: DC

## 2015-06-27 NOTE — Telephone Encounter (Signed)
Your request has been denied Key: [id number] - Rx #: [id number]   Insurance wants him to try Erin. Denial letter placed in metheneys box last Friday. Left message on pt's vm about denial and we will call with further recomendations

## 2015-06-27 NOTE — Telephone Encounter (Signed)
Pt called to check status on PA for Janumet.

## 2015-06-28 ENCOUNTER — Encounter: Payer: Self-pay | Admitting: Family Medicine

## 2015-06-28 ENCOUNTER — Telehealth: Payer: Self-pay | Admitting: Family Medicine

## 2015-06-28 DIAGNOSIS — R1310 Dysphagia, unspecified: Secondary | ICD-10-CM | POA: Diagnosis not present

## 2015-06-28 DIAGNOSIS — R197 Diarrhea, unspecified: Secondary | ICD-10-CM | POA: Diagnosis not present

## 2015-06-28 DIAGNOSIS — K5730 Diverticulosis of large intestine without perforation or abscess without bleeding: Secondary | ICD-10-CM | POA: Diagnosis not present

## 2015-06-28 NOTE — Telephone Encounter (Signed)
Attempted to contact Pt regarding recommendation, no answer. Left VM to return clinic call.

## 2015-06-28 NOTE — Telephone Encounter (Signed)
Call patient: We got notification from [facility] that they are not going to cover his Janumet XR. They will cover Kombiglyze XR which is very similar and works just as well. If he is okay with the change them please let me know and we can send over new prescription to his pharmacy.

## 2015-06-29 MED ORDER — SAXAGLIPTIN-METFORMIN ER 5-1000 MG PO TB24: 1.0000 | ORAL_TABLET | Freq: Every day | ORAL | Status: DC

## 2015-06-29 NOTE — Telephone Encounter (Signed)
Pt advised of [employer] determination. Pt states he is OK with new Rx for Kombiglyze XR. [facility] is the pharmacy of choice. Will route.   Pt also states he spoke with his insurance company and they said Trulicity denial could be appealed becuase Victoza makes Pt "not feel good." Pt states the insurance said PCP can file a "PCR," the phone [phone]. Will route.

## 2015-06-29 NOTE — Telephone Encounter (Signed)
Patient called back and left a message for a return call. I called, no answer, left a message.

## 2015-06-29 NOTE — Telephone Encounter (Signed)
New rx sent

## 2015-07-04 NOTE — Telephone Encounter (Signed)
Pt called and stated that the Victoza caused nausea and vomiting. He stated that he contacted his insurance company and they informed him that they would do a request for the pcp to do a  "PCR" the key for this is  LVYPCX the phone number is [phone].Audelia Hives [place]

## 2015-07-04 NOTE — Telephone Encounter (Signed)
Called the patient and left a vm asking him to give office a call back to let us know specifically how victoza made him feel. If a PCR is submitted the physician has to do this if it has been less than a certain amount of time. Also they may want the patient to have tried the victoza for at least 4 weeks

## 2015-07-07 NOTE — Telephone Encounter (Signed)
Let call to initiate PA

## 2015-07-12 NOTE — Telephone Encounter (Signed)
Have we started PA?

## 2015-07-13 MED ORDER — DULAGLUTIDE 0.75 MG/0.5ML ~~LOC~~ SOAJ
0.7500 mg | SUBCUTANEOUS | Status: DC
Start: 2015-07-13 — End: 2016-01-25

## 2015-07-13 NOTE — Telephone Encounter (Signed)
Called insurance co again and trulicity has been approved; pt notified and sent new rx

## 2015-07-13 NOTE — Telephone Encounter (Signed)
Since I worked with [employer] last week, PAs are slightly backed up, so no I have not looked into this yet.also when I last talked with rep they said the PCP has to call now and do a PCR since there is a denial on file.

## 2015-07-14 ENCOUNTER — Encounter: Payer: Self-pay | Admitting: *Deleted

## 2015-07-14 ENCOUNTER — Emergency Department
Admission: EM | Admit: 2015-07-14 | Discharge: 2015-07-14 | Disposition: A | Discharge status: Home / Self Care | Payer: BLUE CROSS/BLUE SHIELD | Source: Home / Self Care | Attending: Family Medicine | Admitting: Family Medicine

## 2015-07-14 ENCOUNTER — Emergency Department (INDEPENDENT_AMBULATORY_CARE_PROVIDER_SITE_OTHER): Payer: BLUE CROSS/BLUE SHIELD

## 2015-07-14 DIAGNOSIS — K5730 Diverticulosis of large intestine without perforation or abscess without bleeding: Secondary | ICD-10-CM | POA: Diagnosis not present

## 2015-07-14 DIAGNOSIS — K5790 Diverticulosis of intestine, part unspecified, without perforation or abscess without bleeding: Secondary | ICD-10-CM | POA: Diagnosis not present

## 2015-07-14 DIAGNOSIS — K4090 Unilateral inguinal hernia, without obstruction or gangrene, not specified as recurrent: Secondary | ICD-10-CM | POA: Diagnosis not present

## 2015-07-14 DIAGNOSIS — K760 Fatty (change of) liver, not elsewhere classified: Secondary | ICD-10-CM | POA: Diagnosis not present

## 2015-07-14 DIAGNOSIS — K8080 Other cholelithiasis without obstruction: Secondary | ICD-10-CM | POA: Diagnosis not present

## 2015-07-14 DIAGNOSIS — B029 Zoster without complications: Secondary | ICD-10-CM

## 2015-07-14 DIAGNOSIS — M5442 Lumbago with sciatica, left side: Secondary | ICD-10-CM

## 2015-07-14 DIAGNOSIS — R110 Nausea: Secondary | ICD-10-CM | POA: Diagnosis not present

## 2015-07-14 LAB — POCT URINALYSIS DIP (MANUAL ENTRY)
Bilirubin, UA: NEGATIVE
Blood, UA: NEGATIVE
Glucose, UA: NEGATIVE
Ketones, POC UA: NEGATIVE
Leukocytes, UA: NEGATIVE
Nitrite, UA: NEGATIVE
Protein Ur, POC: NEGATIVE
Spec Grav, UA: 1.025 (ref 1.005–1.03)
Urobilinogen, UA: 0.2 (ref 0–1)
pH, UA: 5.5 (ref 5–8)

## 2015-07-14 MED ORDER — ONDANSETRON 4 MG PO TBDP
4.0000 mg | ORAL_TABLET | Freq: Once | ORAL | Status: AC
  Administered 2015-07-14: 4 mg via ORAL

## 2015-07-14 MED ORDER — VALACYCLOVIR HCL 1 G PO TABS: 1000.0000 mg | ORAL_TABLET | Freq: Three times a day (TID) | ORAL | Status: DC

## 2015-07-14 MED ORDER — PREDNISONE 20 MG PO TABS: ORAL_TABLET | ORAL | Status: DC

## 2015-07-14 MED ORDER — ONDANSETRON 4 MG PO TBDP: ORAL_TABLET | ORAL | Status: DC

## 2015-07-14 MED ORDER — OXYCODONE-ACETAMINOPHEN 5-325 MG PO TABS: 1.0000 | ORAL_TABLET | Freq: Four times a day (QID) | ORAL | Status: DC | PRN

## 2015-07-14 NOTE — Discharge Instructions (Signed)
Begin clear liquids (Pedialyte while having diarrhea) until improved, then advance to a [employer] (Bananas, Rice, Applesauce, Toast).  Then gradually resume a regular diet when tolerated.  Avoid milk products until well.  When stools become more formed, may take Imodium (loperamide) once or twice daily to decrease stool frequency.  If symptoms become significantly worse during the night or over the weekend, proceed to the local emergency room.    Shingles Shingles, which is also known as herpes zoster, is an infection that causes a painful skin rash and fluid-filled blisters. Shingles is not related to genital herpes, which is a sexually transmitted infection.   Shingles only develops in people who:  Have had chickenpox.  Have received the chickenpox vaccine. (This is rare.) CAUSES Shingles is caused by varicella-zoster virus (VZV). This is the same virus that causes chickenpox. After exposure to VZV, the virus stays in the body in an inactive (dormant) state. Shingles develops if the virus reactivates. This can happen many years after the initial exposure to VZV. It is not known what causes this virus to reactivate. RISK FACTORS People who have had chickenpox or received the chickenpox vaccine are at risk for shingles. Infection is more common in people who:  Are older than age 35.  Have a weakened defense (immune) system, such as those with HIV, AIDS, or cancer.  Are taking medicines that weaken the immune system, such as transplant medicines.  Are under great stress. SYMPTOMS Early symptoms of this condition include itching, tingling, and pain in an area on your skin. Pain may be described as burning, stabbing, or throbbing. A few days or weeks after symptoms start, a painful red rash appears, usually on one side of the body in a bandlike or beltlike pattern. The rash eventually turns into fluid-filled blisters that break open, scab over, and dry up in about 2-3 weeks. At any time  during the infection, you may also develop:  A fever.  Chills.  A headache.  An upset stomach. DIAGNOSIS This condition is diagnosed with a skin exam. Sometimes, skin or fluid samples are taken from the blisters before a diagnosis is made. These samples are examined under a microscope or sent to a lab for testing. TREATMENT There is no specific cure for this condition. Your health care provider will probably prescribe medicines to help you manage pain, recover more quickly, and avoid long-term problems. Medicines may include:  Antiviral drugs.  Anti-inflammatory drugs.  Pain medicines. If the area involved is on your face, you may be referred to a specialist, such as an eye doctor (ophthalmologist) or an ear, nose, and throat (ENT) doctor to help you avoid eye problems, chronic pain, or disability. HOME CARE INSTRUCTIONS Medicines  Take medicines only as directed by your health care provider.  Apply an anti-itch or numbing cream to the affected area as directed by your health care provider. Blister and Rash Care  Take a cool bath or apply cool compresses to the area of the rash or blisters as directed by your health care provider. This may help with pain and itching.  Keep your rash covered with a loose bandage (dressing). Wear loose-fitting clothing to help ease the pain of material rubbing against the rash.  Keep your rash and blisters clean with mild soap and cool water or as directed by your health care provider.  Check your rash every day for signs of infection. These include redness, swelling, and pain that lasts or increases.  Do not pick your  blisters.  Do not scratch your rash. General Instructions  Rest as directed by your health care provider.  Keep all follow-up visits as directed by your health care provider. This is important.  Until your blisters scab over, your infection can cause chickenpox in people who have never had it or been vaccinated against it. To  prevent this from happening, avoid contact with other people, especially:  Babies.  Pregnant women.  Children who have eczema.  Elderly people who have transplants.  People who have chronic illnesses, such as leukemia or AIDS. SEEK MEDICAL CARE IF:  Your pain is not relieved with prescribed medicines.  Your pain does not get better after the rash heals.  Your rash looks infected. Signs of infection include redness, swelling, and pain that lasts or increases. SEEK IMMEDIATE MEDICAL CARE IF:  The rash is on your face or nose.  You have facial pain, pain around your eye area, or loss of feeling on one side of your face.  You have ear pain or you have ringing in your ear.  You have loss of taste.  Your condition gets worse.   This information is not intended to replace advice given to you by your health care provider. Make sure you discuss any questions you have with your health care provider.   Document Released: 01/29/2005 Document Revised: 02/19/2014 Document Reviewed: 12/10/2013 Elsevier Interactive Patient Education [employer].

## 2015-07-14 NOTE — ED Provider Notes (Signed)
CSN: [id number]     Arrival date & time 07/14/15  1003 History   First MD Initiated Contact with Patient 07/14/15 1110     Chief Complaint  Patient presents with  . Flank Pain     HPI Comments: Patient reports that 6 days ago he bent over to remove clothes from a drawer, and felt sudden sharp pain in his lower back that subsequently radiated to his left anterior thigh.  This pain gradually improved until 3 days later when he developed a different pain in his left lower back paraspinous area that radiated to his left groin.   He denies bowel or bladder dysfunction, and no saddle numbness. He states that he has passed numerous kidney stones in the past, and his present pain is similar.  However, he has noted no hematuria.  He states that he had a left ureteral stent placed 5 years ago.  At the same time of his change in left lower back pain, he noticed an erythematous rash above his left buttock.  He notes that last night he had watery diarrhea, nausea, and an episode of diarrhea.  Because of recurring diarrhea, he underwent colonoscopy 1.5 weeks ago for a history of recurring diarrhea.  The history is provided by the patient.    Past Medical History  Diagnosis Date  . DDD (degenerative disc disease), cervical   . Eosinophilic esophagitis     hx of- [facility]  . High triglycerides   . Low HDL (under 40)   . Diverticulitis   . Low testosterone   . Migraines   . Normal cardiac stress test     DOBUTAMINE STRESS ECHO 10 years ago   . OSA (obstructive sleep apnea)     no cpap used  . Pneumonia 10/15    no problems now  . Diabetes mellitus without complication ([place])   . History of kidney stones     hx  . GERD (gastroesophageal reflux disease)   . History of hiatal hernia   . Complication of anesthesia     low O2 sat after kidney surgery ~ 2009   Past Surgical History  Procedure Laterality Date  . Nasal sinus surgery      Removal polyps   . Kidney stone surgery      laser at [facility].   . Tonsillectomy and adenoidectomy    . Tympanostomy tube placement    . Esophageal dilation  07/18/11    Dr. Tora Duck  . Edg  01/03/12  . Lumbar laminectomy/decompression microdiscectomy Left 01/18/2014    Procedure: Left Thoracic five-six microdiskectomy;  Surgeon: Newman Pies, MD;  Location: [place] [facility];  Service: Neurosurgery;  Laterality: Left;   Family History  Problem Relation Age of Onset  . Diabetes Maternal Grandmother   . Prostate cancer Father     oldr when dx    Social History  Substance Use Topics  . Smoking status: Never Smoker   . Smokeless tobacco: Never Used  . Alcohol Use: No    Review of Systems  Constitutional: Positive for activity change, appetite change and fatigue. Negative for fever, chills and diaphoresis.  HENT: Negative.   Eyes: Negative.   Respiratory: Negative.   Cardiovascular: Negative.   Gastrointestinal: Positive for nausea, vomiting, abdominal pain and diarrhea. Negative for constipation, blood in stool, abdominal distention and rectal pain.  Genitourinary: Positive for flank pain. Negative for dysuria, urgency, frequency, hematuria, decreased urine volume, discharge, scrotal swelling and testicular pain.  Musculoskeletal: Positive  for back pain. Negative for myalgias.  Skin: Positive for rash.  Neurological: Negative for headaches.    Allergies  Niacin  Home Medications   Prior to Admission medications   Medication Sig Start Date End Date Taking? Authorizing Provider  AMBULATORY NON FORMULARY MEDICATION Medication Name: Glucometer and strips and lancet to check once a day.  Dx 250.00 12/10/14   Hali Marry, MD  Dulaglutide (TRULICITY) A999333 0000000 SOPN Inject 0.75 mg into the skin once a week. 07/13/15   Hali Marry, MD  esomeprazole (NEXIUM) 40 MG capsule TAKE 1 CAPSULE BY MOUTH DAILY 02/10/13   Historical Provider, MD  Insulin Pen Needle (PEN NEEDLES) 31G X 8 MM MISC Use for administraiton of Victoza  daily. 06/27/15   Hali Marry, MD  Liraglutide (VICTOZA) 18 MG/3ML SOPN Inject 0.1 mLs (0.6 mg total) into the skin daily. After one week increase to 1.2 mg [place] QD. 06/15/15   Hali Marry, MD  lisinopril (PRINIVIL,ZESTRIL) 20 MG tablet Take 1 tablet (20 mg total) by mouth daily. TAKE 1 TABLET BY MOUTH DAILY. 12/10/14   Hali Marry, MD  ondansetron (ZOFRAN ODT) 4 MG disintegrating tablet Take one tab by mouth Q6hr prn nausea 07/14/15   Kandra Nicolas, MD  oxyCODONE-acetaminophen (ROXICET) 5-325 MG tablet Take 1 tablet by mouth every 6 (six) hours as needed for moderate pain or severe pain. 07/14/15   Kandra Nicolas, MD  predniSONE (DELTASONE) 20 MG tablet Take one tab by mouth twice daily for 5 days, then one daily for 3 days. Take with food. 07/14/15   Kandra Nicolas, MD  Saxagliptin-Metformin 06-998 MG TB24 Take 1 tablet by mouth daily. 06/29/15   Hali Marry, MD  valACYclovir (VALTREX) 1000 MG tablet Take 1 tablet (1,000 mg total) by mouth 3 (three) times daily. 07/14/15   Kandra Nicolas, MD   Meds Ordered and Administered this Visit   Medications  ondansetron (ZOFRAN-ODT) disintegrating tablet 4 mg (4 mg Oral Given 07/14/15 1058)    BP 121/75 mmHg  Pulse 80  Temp(Src) 98.3 F (36.8 C) (Oral)  Wt 235 lb (106.595 kg)  SpO2 97% No data found.   Physical Exam  Constitutional: He is oriented to person, place, and time. He appears well-developed and well-nourished. No distress.  HENT:  Head: Normocephalic.  Nose: Nose normal.  Mouth/Throat: Oropharynx is clear and moist.  Eyes: Conjunctivae are normal. Pupils are equal, round, and reactive to light.  Neck: Neck supple.  Cardiovascular: Normal heart sounds.   Pulmonary/Chest: Breath sounds normal.  Abdominal: Soft. Bowel sounds are normal. He exhibits no mass. There is tenderness. There is no rebound and no guarding.  Musculoskeletal: He exhibits no edema.       Lumbar back: He exhibits tenderness.        Back:  Back:  Range of motion relatively well preserved.     Tenderness in the midline and left paraspinous muscles from L3 to Sacral area.  Straight leg raising test is negative.  Sitting knee extension test is negative.  Strength and sensation in the lower extremities is normal.  Patellar and achilles reflexes are present with augmentation.  Herpetiform eruption left buttock area as noted on diagram.    Lymphadenopathy:    He has no cervical adenopathy.  Neurological: He is alert and oriented to person, place, and time.  Skin: Skin is warm and dry. Rash noted.  Nursing note and vitals reviewed.   ED Course  Procedures none  Labs Reviewed  POCT URINALYSIS DIP (MANUAL ENTRY) negative    Imaging Review Ct Renal Stone Study  07/14/2015  CLINICAL DATA:  Acute left flank pain. EXAM: CT ABDOMEN AND PELVIS WITHOUT CONTRAST TECHNIQUE: Multidetector CT imaging of the abdomen and pelvis was performed following the standard protocol without IV contrast. COMPARISON:  None. FINDINGS: Visualized lung bases are unremarkable. No significant osseous abnormality is noted. Solitary gallstone is noted. Fatty infiltration of the liver is noted. No focal abnormality is seen in the spleen or pancreas on these unenhanced images. Adrenal glands and kidneys appear normal. No hydronephrosis or renal obstruction is noted. No renal or ureteral calculi are noted. There is no evidence of bowel obstruction. The appendix appears normal. No abnormal fluid collection is noted. Sigmoid diverticulosis is noted without inflammation. Urinary bladder appears normal. Mild fat containing left inguinal hernia is noted. No significant adenopathy is noted. IMPRESSION: Fatty infiltration of the liver. Small solitary gallstone. Sigmoid diverticulosis without inflammation. No hydronephrosis or renal obstruction is noted. No renal or ureteral calculi are noted. Small fat containing left inguinal hernia. Electronically Signed   By: Marijo Conception, M.D.   On: 07/14/2015 12:08     MDM   1. Herpes zoster   2. Left-sided low back pain with left-sided sciatica; no evidence nephrolithiasis   3. Nausea without vomiting    Begin Valtrex and prednisone burst/taper.  Zofran for nausea.  Percocet 5-325 for pain (#12, no refill) Begin clear liquids (Pedialyte while having diarrhea) until improved, then advance to a [employer] (Bananas, Rice, Applesauce, Toast).  Then gradually resume a regular diet when tolerated.  Avoid milk products until well.  When stools become more formed, may take Imodium (loperamide) once or twice daily to decrease stool frequency.  If symptoms become significantly worse during the night or over the weekend, proceed to the local emergency room.  Followup with Family Doctor if not improved in one week.     Kandra Nicolas, MD 07/14/15 1524

## 2015-07-14 NOTE — ED Notes (Signed)
Pt c/o left flank pain and nausea x 3 days. Denies hematuria but c/o cloudy urine and urine sediment. H/o stones

## 2015-07-17 NOTE — ED Notes (Signed)
Left a message for patient no answer.

## 2015-10-25 ENCOUNTER — Encounter: Payer: Self-pay | Admitting: Family Medicine

## 2015-10-25 ENCOUNTER — Telehealth: Payer: Self-pay | Admitting: Family Medicine

## 2015-10-25 ENCOUNTER — Ambulatory Visit (INDEPENDENT_AMBULATORY_CARE_PROVIDER_SITE_OTHER): Payer: BLUE CROSS/BLUE SHIELD | Admitting: Family Medicine

## 2015-10-25 VITALS — BP 144/80 | HR 78 | Ht 71.0 in | Wt 239.0 lb

## 2015-10-25 DIAGNOSIS — L821 Other seborrheic keratosis: Secondary | ICD-10-CM

## 2015-10-25 DIAGNOSIS — R933 Abnormal findings on diagnostic imaging of other parts of digestive tract: Secondary | ICD-10-CM | POA: Diagnosis not present

## 2015-10-25 DIAGNOSIS — E1165 Type 2 diabetes mellitus with hyperglycemia: Secondary | ICD-10-CM | POA: Diagnosis not present

## 2015-10-25 DIAGNOSIS — Z125 Encounter for screening for malignant neoplasm of prostate: Secondary | ICD-10-CM

## 2015-10-25 DIAGNOSIS — I10 Essential (primary) hypertension: Secondary | ICD-10-CM | POA: Diagnosis not present

## 2015-10-25 DIAGNOSIS — L723 Sebaceous cyst: Secondary | ICD-10-CM

## 2015-10-25 DIAGNOSIS — Z23 Encounter for immunization: Secondary | ICD-10-CM | POA: Diagnosis not present

## 2015-10-25 DIAGNOSIS — IMO0001 Reserved for inherently not codable concepts without codable children: Secondary | ICD-10-CM

## 2015-10-25 LAB — POCT GLYCOSYLATED HEMOGLOBIN (HGB A1C): HEMOGLOBIN A1C: 9

## 2015-10-25 MED ORDER — LISINOPRIL 20 MG PO TABS: 20.0000 mg | ORAL_TABLET | Freq: Every day | ORAL | 1 refills | Status: DC

## 2015-10-25 MED ORDER — METFORMIN HCL 1000 MG PO TABS: 1000.0000 mg | ORAL_TABLET | Freq: Two times a day (BID) | ORAL | 1 refills | Status: DC

## 2015-10-25 NOTE — Progress Notes (Signed)
Subjective:    CC: DM. HTN  HPI: Diabetes - no hypoglycemic events. No wounds or sores that are not healing well. No increased thirst or urination. Checking glucose at home. Not taking his meds bc one made him nauseated.   He said he stopped the [place] and just to the Trulicity by itself and says the nausea went away. So he just stopped both medications for the last 2 months. Been having a lot of urinary frequency since he came off of this medication.  Hypertension- Pt denies chest pain, SOB, dizziness, or heart palpitations.  According to her refill record he wanted from out of blood pressure medication almost 6 months ago.  He was having bowel problems and persistent diarrhea so did go see his gastroenterologist, Dr. gives at [facility] [place]. He did have endoscopy and colonoscopy. He had it done in May of this year but we are unable to see the report in care everywhere. Will call for copy. He says that he was told to have a blood test for possible celiac as they did see some changes in the intestine indicating the possibility of celiac disease. He is still eating a gluten diet.  Lesion on forehead is bothering him when he fixes his hair.    He also has a lesion on the back of his neck that he would like removed as well. Says it drains and bothersome at times but never goes completely away. He says he is actually try to cut out with a knife himself.  Past medical history, Surgical history, Family history not pertinant except as noted below, Social history, Allergies, and medications have been entered into the medical record, reviewed, and corrections made.   Review of Systems: No fevers, chills, night sweats, weight loss, chest pain, or shortness of breath.   Objective:    General: Well Developed, well nourished, and in no acute distress.  Neuro: Alert and oriented x3, extra-ocular muscles intact, sensation grossly intact.  HEENT: Normocephalic, atraumatic   Skin: Warm and dry, no rashes.He has a 1/2 cm oval-shaped seborrheic keratosis on the left side of the forehead at the hairline. He does have a receding hairline. He has a small sebaceous cyst on the posterior neck towards the left side. Cardiac: Regular rate and rhythm, no murmurs rubs or gallops, no lower extremity edema.  Respiratory: Clear to auscultation bilaterally. Not using accessory muscles, speaking in full sentences.   Impression and Recommendations:    DM- Uncontrolled but he's been off his medication for almost 2 months. Will restart Trulicity and metformin by itself. He was producing on metformin by itself and did well. Will add Onglyza to his intolerance list.  HTN - uncontrolled. Refill the lisinopril today and encouraged him to get back on his medication.  Abnormal colonoscopy-will call for formal test results. Will order blood Test for Celiac. They also noted something about a fat hernia but wasn't sure about the details.  Seborrheic keratosis-cryotherapy performed. Patient tolerated well.   She requests to have PSA tested. Last time was about 5 years ago.  The patient's this-refer to dermatology for full excision.   .Cryotherapy Procedure Note  Pre-operative Diagnosis: Seborrheic keratosis on the edge of the scalp on the left side near his forehead.  Post-operative Diagnosis: same  Locations: forehead, left hair line  Indications: irritated  Anesthesia: not required    Procedure Details  Patient informed of risks (permanent scarring, infection, light or dark discoloration, bleeding, infection, weakness, numbness and recurrence  of the lesion) and benefits of the procedure and verbal informed consent obtained.  The areas are treated with liquid nitrogen therapy, frozen until ice ball extended 1-2 mm beyond lesion, allowed to thaw, and treated again. The patient tolerated procedure well.  The patient was instructed on post-op care, warned that there may be blister  formation, redness and pain. Recommend OTC analgesia as needed for pain.  Condition: Stable  Complications: none.  Plan: 1. Instructed to keep the area dry and covered for 24-48h and clean thereafter. 2. Warning signs of infection were reviewed.   3. Recommended that the patient use OTC acetaminophen as needed for pain.  4. ReturnPRN.

## 2015-10-25 NOTE — Patient Instructions (Signed)
Restart trulicity and metformin.

## 2015-10-25 NOTE — Telephone Encounter (Signed)
Pl;ease Call [facility], Dr. Lavina Hamman for recent EGD and colonoscopy and note from Dr. Lavina Hamman.

## 2015-10-25 NOTE — Telephone Encounter (Signed)
Left VM with medical records requesting reports.

## 2015-10-26 LAB — BASIC METABOLIC PANEL WITH GFR
BUN: 10 mg/dL (ref 7–25)
CHLORIDE: 102 mmol/L (ref 98–110)
CO2: 25 mmol/L (ref 20–31)
CREATININE: 0.98 mg/dL (ref 0.60–1.35)
Calcium: 9.1 mg/dL (ref 8.6–10.3)
GFR, Est Non African American: >89 mL/min (ref >=60)
GLUCOSE: 337 mg/dL — AB (ref 65–99)
POTASSIUM: 4.2 mmol/L (ref 3.5–5.3)
Sodium: 137 mmol/L (ref 135–146)

## 2015-10-26 LAB — CELIAC PANEL 10
Endomysial Screen: NEGATIVE
GLIADIN IGA: 22 U — AB (ref <20)
GLIADIN IGG: 6 U (ref <20)
IgA: 169 mg/dL (ref 81–463)
Tissue Transglut Ab: 4 U/mL (ref <6)
Tissue Transglutaminase Ab, IgA: 1 U/mL (ref <4)

## 2015-10-26 LAB — PSA: PSA: 2.3 ng/mL (ref <=4.0)

## 2015-10-26 NOTE — Addendum Note (Signed)
Addended by: Teddy Spike on: 10/26/2015 03:00 PM   Modules accepted: Orders

## 2015-10-26 NOTE — Telephone Encounter (Signed)
Spoke w/Patty and asked that she fax over pt's edg and colonoscopy report.Audelia Hives [place]

## 2015-10-26 NOTE — Telephone Encounter (Signed)
Dr. Madilyn Fireman is requesting EGD and Colonoscopy report.Nicholas Christian

## 2015-11-23 DIAGNOSIS — L720 Epidermal cyst: Secondary | ICD-10-CM | POA: Diagnosis not present

## 2015-12-14 DIAGNOSIS — L729 Follicular cyst of the skin and subcutaneous tissue, unspecified: Secondary | ICD-10-CM | POA: Diagnosis not present

## 2016-01-24 ENCOUNTER — Ambulatory Visit (INDEPENDENT_AMBULATORY_CARE_PROVIDER_SITE_OTHER): Payer: BLUE CROSS/BLUE SHIELD | Admitting: Family Medicine

## 2016-01-25 ENCOUNTER — Telehealth: Payer: Self-pay | Admitting: Family Medicine

## 2016-01-25 ENCOUNTER — Ambulatory Visit (INDEPENDENT_AMBULATORY_CARE_PROVIDER_SITE_OTHER): Payer: BLUE CROSS/BLUE SHIELD | Admitting: Family Medicine

## 2016-01-25 ENCOUNTER — Encounter: Payer: Self-pay | Admitting: Family Medicine

## 2016-01-25 VITALS — BP 135/72 | HR 78 | Ht 71.0 in | Wt 239.0 lb

## 2016-01-25 DIAGNOSIS — R1032 Left lower quadrant pain: Secondary | ICD-10-CM | POA: Diagnosis not present

## 2016-01-25 DIAGNOSIS — E1165 Type 2 diabetes mellitus with hyperglycemia: Secondary | ICD-10-CM | POA: Diagnosis not present

## 2016-01-25 DIAGNOSIS — IMO0001 Reserved for inherently not codable concepts without codable children: Secondary | ICD-10-CM

## 2016-01-25 DIAGNOSIS — I10 Essential (primary) hypertension: Secondary | ICD-10-CM | POA: Diagnosis not present

## 2016-01-25 DIAGNOSIS — J320 Chronic maxillary sinusitis: Secondary | ICD-10-CM

## 2016-01-25 DIAGNOSIS — G4733 Obstructive sleep apnea (adult) (pediatric): Secondary | ICD-10-CM

## 2016-01-25 LAB — POCT GLYCOSYLATED HEMOGLOBIN (HGB A1C): Hemoglobin A1C: 8.5

## 2016-01-25 MED ORDER — DULAGLUTIDE 1.5 MG/0.5ML ~~LOC~~ SOAJ: 1.5000 mg | SUBCUTANEOUS | 5 refills | Status: DC

## 2016-01-25 MED ORDER — AMOXICILLIN-POT CLAVULANATE 500-125 MG PO TABS: 1.0000 | ORAL_TABLET | Freq: Two times a day (BID) | ORAL | 0 refills | Status: DC

## 2016-01-25 MED ORDER — SITAGLIP PHOS-METFORMIN HCL ER 50-1000 MG PO TB24: 1.0000 | ORAL_TABLET | Freq: Every day | ORAL | 5 refills | Status: DC

## 2016-01-25 NOTE — Progress Notes (Signed)
Subjective:    CC: DM  HPI: Diabetes - no hypoglycemic events. No wounds or sores that are not healing well. No increased thirst or urination. Checking glucose at home. Taking medications as prescribed without any side effects.When I last saw him he had been off his medication for a couple months we decided to restart the Trulicity and metformin. We added the Onglyza to his intolerance list.He admits he is only taking his metformin once a day. He says he frequently forgets the second dose. His eye exam is scheduled for January Lab Results  Component Value Date   HGBA1C 9.0 10/25/2015    Hypertension- Pt denies chest pain, SOB, dizziness, or heart palpitations.  Taking meds as directed w/o problems.  Denies medication side effects.    He feels like he has a persistent sinus infection. He says it started with a cold about 3 months ago and then eventually turned into a sinus infection. He said times where he felt like he was getting better for a few days and then would start to feel symptomatic again. He complains primarily of significant nasal congestion, right-sided maxillary pressure, and his right ear popping intermittently. He says his chest is sore from coughing. No shortness of breath. He says he loses his voice daily. He's had a persistent headache over the eyebrow ridge bilaterally. No GI symptoms. No fevers chills or sweats.  Sleep apnea-He was diagnosed with sleep apnea several years ago. He's tried several different sleep masks and just has not been able to tolerate them. He wants to know what alternative treatments would be.  He is still having some left lower quadrant pain. It's really wear his belt comes across his waist just above the left hip. He did see GI. They felt like he had eosinophilic esophagitis. He says the pain is not debilitating but it still present. He feels like he is moving his bowels normally.  His workup he did have a CT the abdomen and pelvis which did show a small  left inguinal fat-containing hernia. He wonders if this could be causing some of his discomfort.  Past medical history, Surgical history, Family history not pertinant except as noted below, Social history, Allergies, and medications have been entered into the medical record, reviewed, and corrections made.   Review of Systems: No fevers, chills, night sweats, weight loss, chest pain, or shortness of breath.   Objective:    General: Well Developed, well nourished, and in no acute distress.  Neuro: Alert and oriented x3, extra-ocular muscles intact, sensation grossly intact.  HEENT: Normocephalic, atraumatic  Skin: Warm and dry, no rashes. Cardiac: Regular rate and rhythm, no murmurs rubs or gallops, no lower extremity edema.  Respiratory: Clear to auscultation bilaterally. Not using accessory muscles, speaking in full sentences.   Impression and Recommendations:   DM- Will change metformin back to Janumet. He did well on it before but it wasn't covered on his formulary so we have switched Onglyza. Unfortunately he had side effects to this.  He has his eye exam scheduled for January.  Increase trulicity to 1.5mg .  I'm hoping this will get his A1c down close to 7 it may not be quite at goal but should be close. Also encouraged him to continue to work on diet and exercise. He admits he could do a lot better particularly with diet.  OSA - His consider alternatives to CPAP such as oral appliances etc. He would really best benefit from a consultation with a physician who is boarded  and sleep medicine to really discuss the alternatives and pros and cons and see what he might be a good candidate for. He is okay to place the referral. He says it was somewhat particular and Adrian [facility]water that he was interested and he just can't remember the name at this moment. I'll go ahead and place the referral and will put a hold on it until he can call us back with that name.  Chronic sinusitis - Treat with Augmentin 500 mg  twice a day for 3 weeks to see if we can get this better. If he is not better than encouraged him to call me back and let me know and not wait until his 3 month follow-up for diabetes.  HTN - Well controlled. Continue current regimen. Due for lipids and CMP. Follow up in  3-4 months.   LLQ pain - he does have a fat-containing hernia on the left side. We certainly could consider referring him to neurosurgery for further evaluation and treatment.

## 2016-01-25 NOTE — Telephone Encounter (Signed)
Please call patient: I did check the imaging study. He did have a fat-containing hernia on that left lower inguinal side. There is a little closer to the groin crease then it is where he is pointing. Though it may be worth having him see general surgery for consultation just to see what they think and if they think it should be corrected. If he is okay with this and okay to place referral for [facility] surgery.  Beatrice Lecher, MD

## 2016-01-25 NOTE — Telephone Encounter (Signed)
Left VM for Pt to return clinic call regarding results and recommendations. Callback information provided.

## 2016-01-26 ENCOUNTER — Telehealth: Payer: Self-pay | Admitting: *Deleted

## 2016-01-26 NOTE — Telephone Encounter (Signed)
[facility] - Rx #: [id number]

## 2016-01-31 NOTE — Telephone Encounter (Signed)
Received fax from [place] has been approved from 01/26/16 - 02/11/2038. Reference Number [facility]. I will notify the pharmacy. - CF

## 2016-02-07 ENCOUNTER — Emergency Department (INDEPENDENT_AMBULATORY_CARE_PROVIDER_SITE_OTHER)
Admission: EM | Admit: 2016-02-07 | Discharge: 2016-02-07 | Disposition: A | Discharge status: Home / Self Care | Payer: BLUE CROSS/BLUE SHIELD | Source: Home / Self Care | Attending: Family Medicine | Admitting: Family Medicine

## 2016-02-07 ENCOUNTER — Encounter: Payer: Self-pay | Admitting: *Deleted

## 2016-02-07 DIAGNOSIS — J101 Influenza due to other identified influenza virus with other respiratory manifestations: Secondary | ICD-10-CM

## 2016-02-07 LAB — POCT INFLUENZA A/B
Influenza A, POC: POSITIVE — AB
Influenza B, POC: NEGATIVE

## 2016-02-07 MED ORDER — OSELTAMIVIR PHOSPHATE 75 MG PO CAPS: 75.0000 mg | ORAL_CAPSULE | Freq: Two times a day (BID) | ORAL | 0 refills | Status: DC

## 2016-02-07 MED ORDER — PROMETHAZINE HCL 25 MG PO TABS: 25.0000 mg | ORAL_TABLET | Freq: Four times a day (QID) | ORAL | 0 refills | Status: DC | PRN

## 2016-02-07 NOTE — ED Triage Notes (Signed)
Patient c/o fever, t-max 102, sore throat, HA and dizziness x yesterday. He has been on amoxicillin for 1 week for URI. Taken Advil otc. Rec'd flu vac this season.

## 2016-02-07 NOTE — ED Provider Notes (Signed)
CSN: [id number]     Arrival date & time 02/07/16  1124 History   First MD Initiated Contact with Patient 02/07/16 1247     Chief Complaint  Patient presents with  . Cough  . Headache  . Dizziness   (Consider location/radiation/quality/duration/timing/severity/associated sxs/prior Treatment) HPI Nicholas Christian is a 50 y.o. male presenting to UC with c/o sudden onset fever Tmax 102*F yesterday with associated sore throat, generalized headache and dizziness.  Pt has been on amoxicillin for about 1 week due to URI symptoms as he has a tendency to develop pneumonia, he has had it 3 times already.  Pt notes he has not taken his amoxicillin yesterday or today due to stomach upset but denies vomiting or diarrhea.  Pt notes he did get the flu vaccine this year. Denies chest pain or SOB. No hx of asthma or COPD.   Past Medical History:  Diagnosis Date  . Complication of anesthesia    low O2 sat after kidney surgery ~ 2009  . DDD (degenerative disc disease), cervical   . Diabetes mellitus without complication ([place])   . Diverticulitis   . Eosinophilic esophagitis    hx of- [facility]  . GERD (gastroesophageal reflux disease)   . High triglycerides   . History of hiatal hernia   . History of kidney stones    hx  . Low HDL (under 40)   . Low testosterone   . Migraines   . Normal cardiac stress test    DOBUTAMINE STRESS ECHO 10 years ago   . OSA (obstructive sleep apnea)    no cpap used  . Pneumonia 10/15   no problems now   Past Surgical History:  Procedure Laterality Date  . EDG  01/03/12  . ESOPHAGEAL DILATION  07/18/11   Dr. Tora Duck  . KIDNEY STONE SURGERY     laser at [place] MICRODISCECTOMY Left 01/18/2014   Procedure: Left Thoracic five-six microdiskectomy;  Surgeon: Newman Pies, MD;  Location: [facility];  Service: Neurosurgery;  Laterality: Left;  . NASAL SINUS SURGERY     Removal polyps   . TONSILLECTOMY AND ADENOIDECTOMY     . TYMPANOSTOMY TUBE PLACEMENT     Family History  Problem Relation Age of Onset  . Prostate cancer Father     oldr when dx   . Diabetes Maternal Grandmother    Social History  Substance Use Topics  . Smoking status: Never Smoker  . Smokeless tobacco: Never Used  . Alcohol use No    Review of Systems  Constitutional: Positive for chills, fatigue and fever.  HENT: Positive for postnasal drip, rhinorrhea, sinus pain, sinus pressure and sore throat. Negative for congestion, ear pain, trouble swallowing and voice change.   Respiratory: Positive for cough. Negative for shortness of breath.   Cardiovascular: Negative for chest pain and palpitations.  Gastrointestinal: Positive for nausea. Negative for abdominal pain, diarrhea and vomiting.  Musculoskeletal: Negative for arthralgias, back pain and myalgias.  Skin: Negative for rash.  Neurological: Positive for dizziness and headaches. Negative for light-headedness.    Allergies  Niacin and Onglyza [saxagliptin]  Home Medications   Prior to Admission medications   Medication Sig Start Date End Date Taking? Authorizing Provider  AMBULATORY NON FORMULARY MEDICATION Medication Name: Glucometer and strips and lancet to check once a day.  Dx 250.00 12/10/14   Hali Marry, MD  amoxicillin-clavulanate (AUGMENTIN) 500-125 MG tablet Take 1 tablet (500 mg total) by mouth  2 (two) times daily. 01/25/16   Hali Marry, MD  Dulaglutide (TRULICITY) 1.5 0000000 SOPN Inject 1.5 mg into the skin once a week. 01/25/16   Hali Marry, MD  esomeprazole (NEXIUM) 40 MG capsule TAKE 1 CAPSULE BY MOUTH DAILY 02/10/13   Historical Provider, MD  Insulin Pen Needle (PEN NEEDLES) 31G X 8 MM MISC Use for administraiton of Victoza daily. 06/27/15   Hali Marry, MD  lisinopril (PRINIVIL,ZESTRIL) 20 MG tablet Take 1 tablet (20 mg total) by mouth daily. TAKE 1 TABLET BY MOUTH DAILY. 10/25/15   Hali Marry, MD  oseltamivir  (TAMIFLU) 75 MG capsule Take 1 capsule (75 mg total) by mouth every 12 (twelve) hours. 02/07/16   Noland Fordyce, PA-C  promethazine (PHENERGAN) 25 MG tablet Take 1 tablet (25 mg total) by mouth every 6 (six) hours as needed for nausea or vomiting. 02/07/16   Noland Fordyce, PA-C  SitaGLIPtin-MetFORMIN HCl (JANUMET XR) 50-1000 MG TB24 Take 1 tablet by mouth daily. 01/25/16   Hali Marry, MD   Meds Ordered and Administered this Visit  Medications - No data to display  BP 127/88 (BP Location: Left Arm)   Pulse 119   Temp 98.5 F (36.9 C) (Oral)   Resp 18   Wt 237 lb (107.5 kg)   SpO2 96%   BMI 33.05 kg/m  No data found.   Physical Exam  Constitutional: He appears well-developed and well-nourished. No distress.  Pt sitting on exam bed, appears fatigued but is cooperative during exam. NAD  HENT:  Head: Normocephalic and atraumatic.  Right Ear: Tympanic membrane normal.  Left Ear: Tympanic membrane normal.  Nose: Nose normal.  Mouth/Throat: Uvula is midline, oropharynx is clear and moist and mucous membranes are normal.  Eyes: Conjunctivae are normal. No scleral icterus.  Neck: Normal range of motion. Neck supple.  Cardiovascular: Normal rate, regular rhythm and normal heart sounds.   Pulmonary/Chest: Effort normal and breath sounds normal. No respiratory distress. He has no wheezes. He has no rales.  Musculoskeletal: Normal range of motion.  Neurological: He is alert.  Skin: Skin is warm and dry. He is not diaphoretic.  Nursing note and vitals reviewed.   Urgent Care Course   Clinical Course     Procedures (including critical care time)  Labs Review Labs Reviewed  POCT INFLUENZA A/B - Abnormal; Notable for the following:       Result Value   Influenza A, POC Positive (*)    All other components within normal limits    Imaging Review No results found.    MDM   1. Influenza A    Pt c/o sudden onset fever, chills, headache, fatigue. He has been on  amoxicillin for 1 week for URI symptoms.  Rapid flu: POSITIVE for influenza A, negative for influenza B.  Due to fever and chills starting yesterday, will start pt on Tamiflu and phenergan. Encouraged fluids, rest, acetaminophen and ibuprofen. F/u with PCP or return to UC if needed in 4-5 days if not improving, sooner if worsening.      Noland Fordyce, PA-C 02/07/16 1430

## 2016-02-07 NOTE — Discharge Instructions (Signed)
°  It is important to take all medication as prescribed to help it work the best.  If you get stomach upset while taking your antibiotic, you may try taking your nausea medication 30 minutes prior to taking your antibiotic.  You may also try eating a small snack or meal and taking over the counter probiotics to help, or eating yogurt which naturally has probiotics.   Please follow up with your primary care provider by the end of this week or early next week for recheck of symptoms to make sure you are doing better.

## 2016-02-08 NOTE — Telephone Encounter (Signed)
Let send him a letter since unable to contact by phone.

## 2016-03-08 ENCOUNTER — Other Ambulatory Visit: Payer: Self-pay | Admitting: Family Medicine

## 2016-03-13 DIAGNOSIS — E119 Type 2 diabetes mellitus without complications: Secondary | ICD-10-CM | POA: Diagnosis not present

## 2016-03-13 LAB — HM DIABETES EYE EXAM

## 2016-03-29 ENCOUNTER — Encounter: Payer: Self-pay | Admitting: Family Medicine

## 2016-04-24 ENCOUNTER — Ambulatory Visit (INDEPENDENT_AMBULATORY_CARE_PROVIDER_SITE_OTHER): Payer: BLUE CROSS/BLUE SHIELD | Admitting: Family Medicine

## 2016-04-24 ENCOUNTER — Encounter: Payer: Self-pay | Admitting: Family Medicine

## 2016-04-24 VITALS — BP 118/69 | HR 70 | Ht 71.0 in | Wt 241.0 lb

## 2016-04-24 DIAGNOSIS — E1165 Type 2 diabetes mellitus with hyperglycemia: Secondary | ICD-10-CM | POA: Diagnosis not present

## 2016-04-24 DIAGNOSIS — R1032 Left lower quadrant pain: Secondary | ICD-10-CM | POA: Diagnosis not present

## 2016-04-24 DIAGNOSIS — K4090 Unilateral inguinal hernia, without obstruction or gangrene, not specified as recurrent: Secondary | ICD-10-CM | POA: Diagnosis not present

## 2016-04-24 DIAGNOSIS — IMO0001 Reserved for inherently not codable concepts without codable children: Secondary | ICD-10-CM

## 2016-04-24 DIAGNOSIS — I10 Essential (primary) hypertension: Secondary | ICD-10-CM | POA: Diagnosis not present

## 2016-04-24 LAB — POCT GLYCOSYLATED HEMOGLOBIN (HGB A1C): Hemoglobin A1C: 8.3

## 2016-04-24 MED ORDER — DULAGLUTIDE 0.75 MG/0.5ML ~~LOC~~ SOAJ: 0.7500 mg | SUBCUTANEOUS | 5 refills | Status: DC

## 2016-04-24 MED ORDER — DAPAGLIFLOZIN PRO-METFORMIN ER 10-1000 MG PO TB24: 1.0000 | ORAL_TABLET | Freq: Every day | ORAL | 5 refills | Status: DC

## 2016-04-24 NOTE — Progress Notes (Signed)
Subjective:    CC: DM  HPI:  Diabetes - no hypoglycemic events. No wounds or sores that are not healing well. No increased thirst or urination. Checking glucose at home. Taking medications as prescribed without any side effects.Currently on Trulicity and Janumet. Is not currently exercising but says he plans on getting back on track with running as soon as the weather gets a little warmer. He also feels that the Trulicity is causing some nausea most nights.  He also continues to complain about left lower quadrant pain is been going on for almost a year. He had a colonoscopy in May 2017. He also had a CT abdomen performed in March 2017 which did show some diverticulosis and otherwise negative. He then had a CT renal study done in June 2017 which did show a small fat-containing left inguinal hernia.  Hypertension- Pt denies chest pain, SOB, dizziness, or heart palpitations.  Taking meds as directed w/o problems.  Denies medication side effects.      Past medical history, Surgical history, Family history not pertinant except as noted below, Social history, Allergies, and medications have been entered into the medical record, reviewed, and corrections made.   Review of Systems: No fevers, chills, night sweats, weight loss, chest pain, or shortness of breath.   Objective:    General: Well Developed, well nourished, and in no acute distress.  Neuro: Alert and oriented x3, extra-ocular muscles intact, sensation grossly intact.  HEENT: Normocephalic, atraumatic  Skin: Warm and dry, no rashes. Cardiac: Regular rate and rhythm, no murmurs rubs or gallops, no lower extremity edema.  Respiratory: Clear to auscultation bilaterally. Not using accessory muscles, speaking in full sentences.    Impression and Recommendations:    DM- Uncontrolled.  Hemoglobin A1c down to 8.3 which is down slightly from 8.5 previously.will change janumet to [place].  F/U in 3 month. We also discussed getting back on track  with diet and exercise. Crease Trulicity back down to 0.98 mg. Lab Results  Component Value Date   HGBA1C 8.3 04/24/2016   LLQ pain - Could possibly be from the left inguinal hernia. Will refer to general surgery for further consultation and evaluation as these had a essentially negative GI workup at this time as a potential cause for his left lower quadrant pain.  HTN - [facility]well 6 months.

## 2016-04-24 NOTE — Patient Instructions (Signed)
Work on healthy diet and exercise 

## 2016-05-02 ENCOUNTER — Telehealth: Payer: Self-pay | Admitting: *Deleted

## 2016-05-02 NOTE — Telephone Encounter (Signed)
Called [place] for xigduo

## 2016-05-23 ENCOUNTER — Ambulatory Visit: Payer: Self-pay | Admitting: Surgery

## 2016-05-23 DIAGNOSIS — K4090 Unilateral inguinal hernia, without obstruction or gangrene, not specified as recurrent: Secondary | ICD-10-CM | POA: Diagnosis not present

## 2016-05-23 NOTE — H&P (Signed)
Nicholas Christian 05/23/2016 1:37 PM Location: [facility] Patient #: [id number] DOB: [birth year] Married / Language: Cleophus Molt / Race: White Male  History of Present Illness (Janaia Kozel A. Kae Heller MD; 05/23/2016 1:56 PM) Patient words: This is a nice 51 year old gentleman who presents with a symptomatic left inguinal hernia. He has been bothering him for about 2 years. He has never noticed a bulge experiences pain in left groin which is exacerbated by physical activity including running and standing. The pain is along the inguinal ligament, does not radiate. He describes it less as pain and more as discomfort. He does not have any symptoms on the right side. His medical history is notable for obstructive sleep apnea, diabetes, eosinophilic esophagitis which requires serial dilations. No prior abdominal surgeries. He underwent CT scan about 1 year ago which demonstrated a fat filled left inguinal hernia.  The patient is a 51 year old male.   Past Surgical History Malachi Bonds, CMA; 05/23/2016 1:37 PM) Spinal Surgery Midback Tonsillectomy  Diagnostic Studies History Malachi Bonds, CMA; 05/23/2016 1:37 PM) Colonoscopy within last year  Allergies Malachi Bonds, CMA; 05/23/2016 1:38 PM) Niacin *VITAMINS*  Medication History (Chemira Jones, CMA; 1/60/1093 2:35 PM) Trulicity (0.75MG /0.5ML Soln Pen-inj, Subcutaneous) Active. Xigduo XR (10-1000MG  Tablet ER 24HR, Oral) Active. NexIUM (40MG  Capsule DR, Oral) Active. Lisinopril (20MG  Tablet, Oral) Active. Medications Reconciled  Social History Malachi Bonds, CMA; 05/23/2016 1:37 PM) Alcohol use Occasional alcohol use. Caffeine use Carbonated beverages, Tea. No drug use Tobacco use Never smoker.  Family History Malachi Bonds, CMA; 05/23/2016 1:37 PM) Alcohol Abuse Father. Prostate Cancer Father.  Other Problems Malachi Bonds, CMA; 05/23/2016 1:37 PM) Anxiety Disorder Back Pain Diabetes  Mellitus Diverticulosis Gastroesophageal Reflux Disease High blood pressure Kidney Stone Migraine Headache Sleep Apnea     Review of Systems Malachi Bonds CMA; 05/23/2016 1:37 PM) General Not Present- Appetite Loss, Chills, Fatigue, Fever, Night Sweats, Weight Gain and Weight Loss. Skin Not Present- Change in Wart/Mole, Dryness, Hives, Jaundice, New Lesions, Non-Healing Wounds, Rash and Ulcer. HEENT Not Present- Earache, Hearing Loss, Hoarseness, Nose Bleed, Oral Ulcers, Ringing in the Ears, Seasonal Allergies, Sinus Pain, Sore Throat, Visual Disturbances, Wears glasses/contact lenses and Yellow Eyes. Respiratory Not Present- Bloody sputum, Chronic Cough, Difficulty Breathing, Snoring and Wheezing. Breast Not Present- Breast Mass, Breast Pain, Nipple Discharge and Skin Changes. Cardiovascular Present- Leg Cramps. Not Present- Chest Pain, Difficulty Breathing Lying Down, Palpitations, Rapid Heart Rate, Shortness of Breath and Swelling of Extremities. Gastrointestinal Present- Abdominal Pain and Difficulty Swallowing. Not Present- Bloating, Bloody Stool, Change in Bowel Habits, Chronic diarrhea, Constipation, Excessive gas, Gets full quickly at meals, Hemorrhoids, Indigestion, Nausea, Rectal Pain and Vomiting. Male Genitourinary Not Present- Blood in Urine, Change in Urinary Stream, Frequency, Impotence, Nocturia, Painful Urination, Urgency and Urine Leakage. Musculoskeletal Not Present- Back Pain, Joint Pain, Joint Stiffness, Muscle Pain, Muscle Weakness and Swelling of Extremities. Neurological Present- Numbness and Tingling. Not Present- Decreased Memory, Fainting, Headaches, Seizures, Tremor, Trouble walking and Weakness. Psychiatric Present- Anxiety and Change in Sleep Pattern. Not Present- Bipolar, Depression, Fearful and Frequent crying. Endocrine Not Present- Cold Intolerance, Excessive Hunger, Hair Changes, Heat Intolerance, Hot flashes and New Diabetes. Hematology Not Present-  Blood Thinners, Easy Bruising, Excessive bleeding, Gland problems, HIV and Persistent Infections.  Vitals (Chemira Jones CMA; 05/23/2016 1:37 PM) 05/23/2016 1:37 PM Weight: 237.8 lb Height: 71in Body Surface Area: 2.27 m Body Mass Index: 33.17 kg/m  Temp.: 98.62F(Oral)  Pulse: 70 (Regular)  BP: 126/82 (Sitting, Left Arm, Standard)  Physical Exam (Shera Laubach A. Kae Heller MD; 05/23/2016 1:58 PM)  General Mental Status-Alert. General Appearance-Consistent with stated age. Hydration-Well hydrated. Voice-Normal.  Head and Neck Head-normocephalic, atraumatic with no lesions or palpable masses. Trachea-midline. Thyroid Gland Characteristics - normal size and consistency.  Eye Eyeball - Bilateral-Extraocular movements intact. Sclera/Conjunctiva - Bilateral-No scleral icterus.  Chest and Lung Exam Chest and lung exam reveals -quiet, even and easy respiratory effort with no use of accessory muscles and on auscultation, normal breath sounds, no adventitious sounds and normal vocal resonance. Inspection Chest Wall - Normal. Back - normal.  Breast - Did not examine.  Cardiovascular Cardiovascular examination reveals -normal heart sounds, regular rate and rhythm with no murmurs and normal pedal pulses bilaterally.  Abdomen Inspection Skin - Scar - no surgical scars. Hernias - Inguinal hernia - Left - Note: Small left inguinal hernia, very difficult to feel. Some laxity on the right side as well. Palpation/Percussion Palpation and Percussion of the abdomen reveal - Soft, Non Tender, No Rebound tenderness, No Rigidity (guarding) and No hepatosplenomegaly. Auscultation Auscultation of the abdomen reveals - Bowel sounds normal.  Peripheral Vascular Upper Extremity Palpation - Pulses bilaterally normal.  Neurologic Neurologic evaluation reveals -alert and oriented x 3 with no impairment of recent or remote memory. Mental  Status-Normal.  Neuropsychiatric The patient's mood and affect are described as -normal. Judgment and Insight-insight is appropriate concerning matters relevant to self.  Musculoskeletal Normal Exam - Left-Upper Extremity Strength Normal and Lower Extremity Strength Normal. Normal Exam - Right-Upper Extremity Strength Normal and Lower Extremity Strength Normal.  Lymphatic Head & Neck  General Head & Neck Lymphatics: Bilateral - Description - Normal. Axillary - Did not examine. Femoral & Inguinal - Did not examine.    Assessment & Plan (Xabi Wittler A. Kae Heller MD; 05/23/2016 2:00 PM)  LEFT INGUINAL HERNIA (K40.90) Story: This is more evident on CT scan and is on physical exam, but started to cause him discomfort. He would like to pursue repair. I discussed with him at length (surgery versus laparoscopic including the risks and benefits of each. Although I did recommend an open repair of a left inguinal hernia as his first operation, he requested laparoscopic repair due to shorter recovery time and less pain anticipated, as he would like to get back to running as quickly as possible after surgery. We discussed risks of intra-abdominal injury, nerve injury chronic pain, bleeding, infection, recurrence of hernia, etc. He had several insightful questions all of which have been answered. We will get him scheduled for surgery at the earliest convenience.

## 2016-06-25 ENCOUNTER — Ambulatory Visit: Payer: BLUE CROSS/BLUE SHIELD | Admitting: Family Medicine

## 2016-07-25 ENCOUNTER — Encounter: Payer: Self-pay | Admitting: Family Medicine

## 2016-07-25 ENCOUNTER — Ambulatory Visit (INDEPENDENT_AMBULATORY_CARE_PROVIDER_SITE_OTHER): Payer: BLUE CROSS/BLUE SHIELD | Admitting: Family Medicine

## 2016-07-25 VITALS — BP 110/60 | HR 62 | Ht 69.69 in | Wt 224.0 lb

## 2016-07-25 DIAGNOSIS — R748 Abnormal levels of other serum enzymes: Secondary | ICD-10-CM | POA: Diagnosis not present

## 2016-07-25 DIAGNOSIS — E1165 Type 2 diabetes mellitus with hyperglycemia: Secondary | ICD-10-CM | POA: Diagnosis not present

## 2016-07-25 DIAGNOSIS — Z6832 Body mass index (BMI) 32.0-32.9, adult: Secondary | ICD-10-CM

## 2016-07-25 DIAGNOSIS — I10 Essential (primary) hypertension: Secondary | ICD-10-CM | POA: Diagnosis not present

## 2016-07-25 DIAGNOSIS — IMO0001 Reserved for inherently not codable concepts without codable children: Secondary | ICD-10-CM

## 2016-07-25 LAB — LIPID PANEL W/REFLEX DIRECT LDL
CHOL/HDL RATIO: 4.8 ratio (ref <5.0)
Cholesterol: 160 mg/dL (ref <200)
HDL: 33 mg/dL — ABNORMAL LOW (ref >40)
LDL-Cholesterol: 104 mg/dL — ABNORMAL HIGH
Non-HDL Cholesterol (Calc): 127 mg/dL (ref <130)
Triglycerides: 135 mg/dL (ref <150)

## 2016-07-25 LAB — COMPLETE METABOLIC PANEL WITH GFR
ALBUMIN: 4.2 g/dL (ref 3.6–5.1)
ALT: 47 U/L — AB (ref 9–46)
AST: 30 U/L (ref 10–35)
Alkaline Phosphatase: 60 U/L (ref 40–115)
BUN: 12 mg/dL (ref 7–25)
CO2: 28 mmol/L (ref 20–31)
CREATININE: 1.09 mg/dL (ref 0.70–1.33)
Calcium: 8.8 mg/dL (ref 8.6–10.3)
Chloride: 106 mmol/L (ref 98–110)
GFR, Est African American: >89 mL/min (ref >=60)
GFR, Est Non African American: 79 mL/min (ref >=60)
GLUCOSE: 109 mg/dL — AB (ref 65–99)
POTASSIUM: 4.1 mmol/L (ref 3.5–5.3)
SODIUM: 141 mmol/L (ref 135–146)
TOTAL PROTEIN: 6.3 g/dL (ref 6.1–8.1)
Total Bilirubin: 0.9 mg/dL (ref 0.2–1.2)

## 2016-07-25 LAB — POCT GLYCOSYLATED HEMOGLOBIN (HGB A1C): HEMOGLOBIN A1C: 6.8

## 2016-07-25 MED ORDER — LISINOPRIL 20 MG PO TABS: 20.0000 mg | ORAL_TABLET | Freq: Every day | ORAL | 1 refills | Status: DC

## 2016-07-25 MED ORDER — LISINOPRIL 10 MG PO TABS: 10.0000 mg | ORAL_TABLET | Freq: Every day | ORAL | 1 refills | Status: DC

## 2016-07-25 NOTE — Progress Notes (Signed)
Subjective:    CC: DM, HTN  HPI:  Diabetes - no hypoglycemic events. No wounds or sores that are not healing well. No increased thirst or urination. Checking glucose at home. Taking medications as prescribed without any side effects.We changed his Xigduo at last office visit from [place].  Hypertension- Pt denies chest pain, SOB, dizziness, or heart palpitations.  Taking meds as directed w/o problems.  Denies medication side effects.    Obesity-he is actually lost 17 pounds since he was last here and is doing fantastic.he has started running for exercise.    Past medical history, Surgical history, Family history not pertinant except as noted below, Social history, Allergies, and medications have been entered into the medical record, reviewed, and corrections made.   Review of Systems: No fevers, chills, night sweats, weight loss, chest pain, or shortness of breath.   Objective:    General: Well Developed, well nourished, and in no acute distress.  Neuro: Alert and oriented x3, extra-ocular muscles intact, sensation grossly intact.  HEENT: Normocephalic, atraumatic  Skin: Warm and dry, no rashes. Cardiac: Regular rate and rhythm, no murmurs rubs or gallops, no lower extremity edema.  Respiratory: Clear to auscultation bilaterally. Not using accessory muscles, speaking in full sentences.   Impression and Recommendations:    DM- Much improved. Well-controlled. Hemoglobin A1c down to 6.8 which is fantastic considering previous was 8.3.  HTN - BP actually a little on the lower in today. Okay to cut lisinopril in half. Congratulated him on the weight loss.  BMI 32-congratulated him on weight loss.

## 2016-07-25 NOTE — Patient Instructions (Signed)
Will decrease the lisinopril to 10mg .

## 2016-07-27 ENCOUNTER — Other Ambulatory Visit: Payer: Self-pay | Admitting: Family Medicine

## 2016-07-27 NOTE — Addendum Note (Signed)
Addended by: Teddy Spike on: 07/27/2016 07:57 AM   Modules accepted: Orders

## 2016-08-09 IMAGING — CT CT RENAL STONE PROTOCOL
2 of 4 series · 16 of 46 positions shown, 18 images · non-contrast
Comparison: None.

CLINICAL DATA: Acute left flank pain.

EXAM:
CT ABDOMEN AND PELVIS WITHOUT CONTRAST
TECHNIQUE: Multidetector CT imaging of the abdomen and pelvis was performed
following the standard protocol without IV contrast.

[Series 2: axial st · axial · 0.98mm/px · z∈[-580,-25]mm · 13 of 121 slices shown, 15 images]
[im 5/121  soft-tissue]
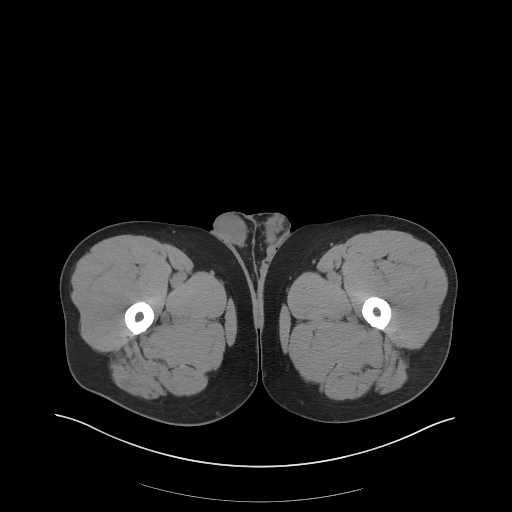
[im 5/121  bone]
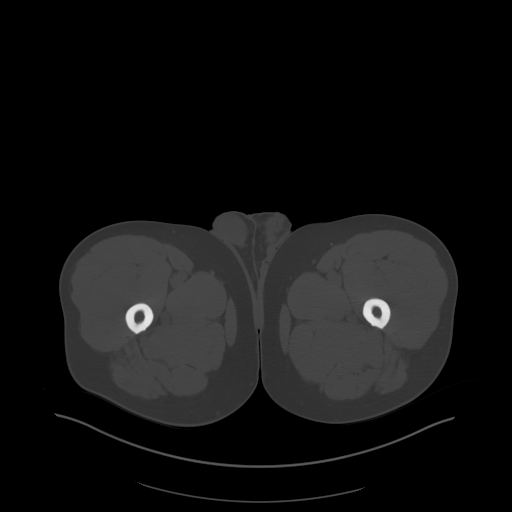
[im 14/121  soft-tissue]
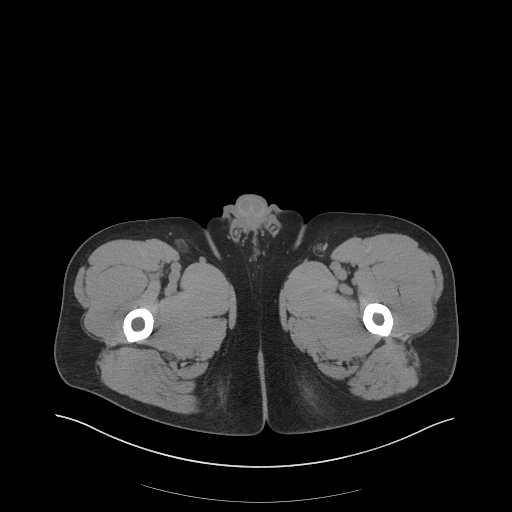
[im 24/121  soft-tissue]
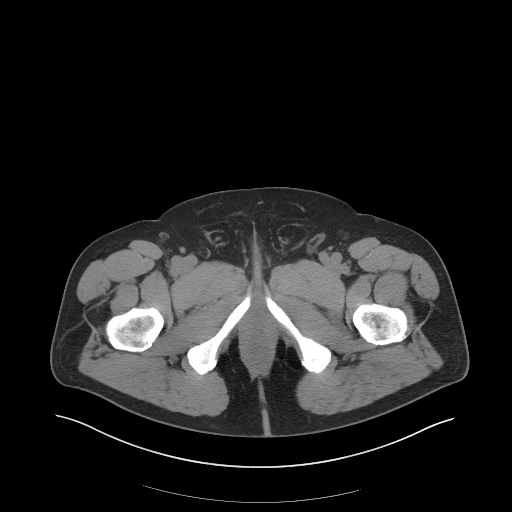
[im 33/121  soft-tissue]
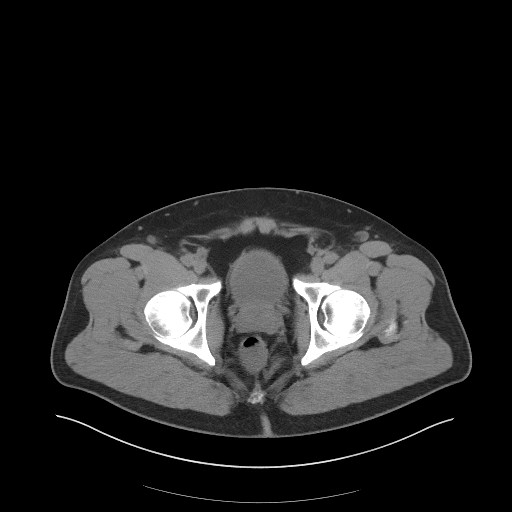
[im 42/121  soft-tissue]
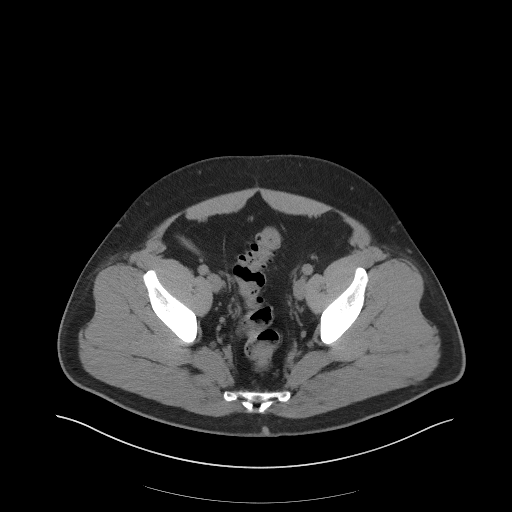
[im 51/121  soft-tissue]
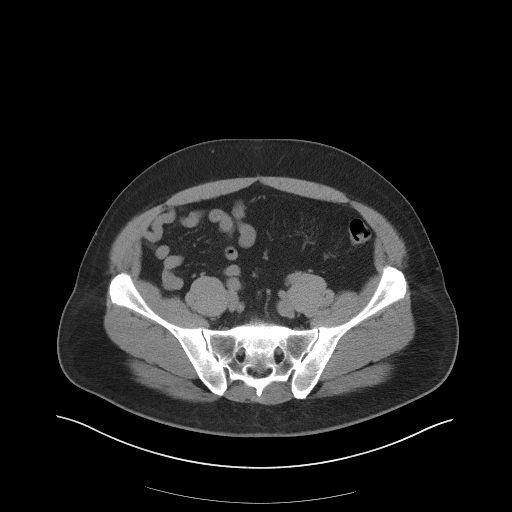
[im 60/121  soft-tissue]
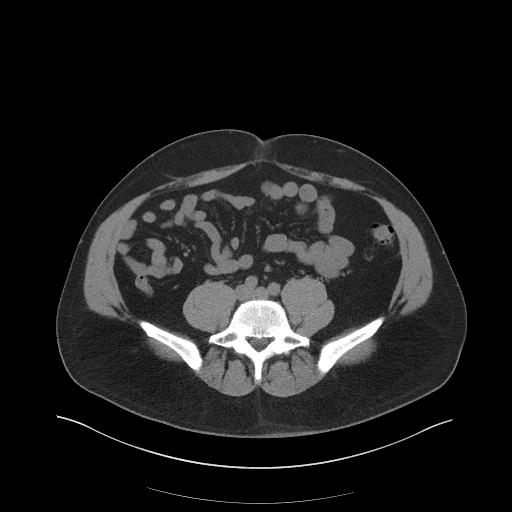
[im 70/121  soft-tissue]
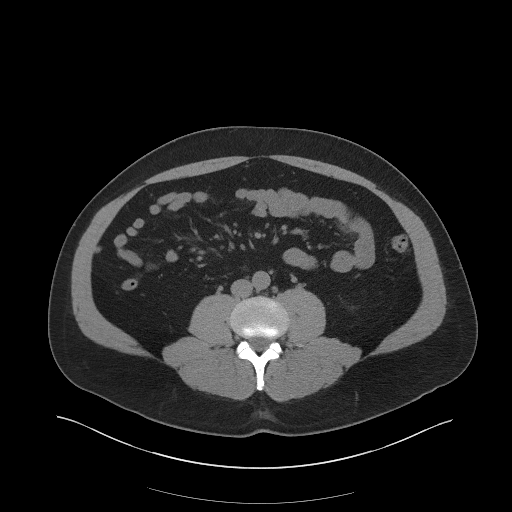
[im 79/121  soft-tissue]
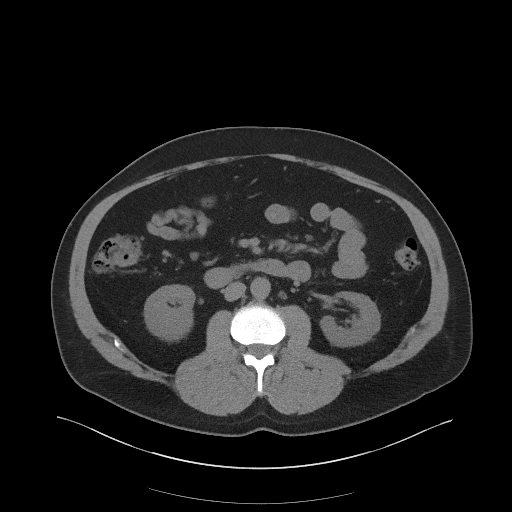
[im 79/121  bone]
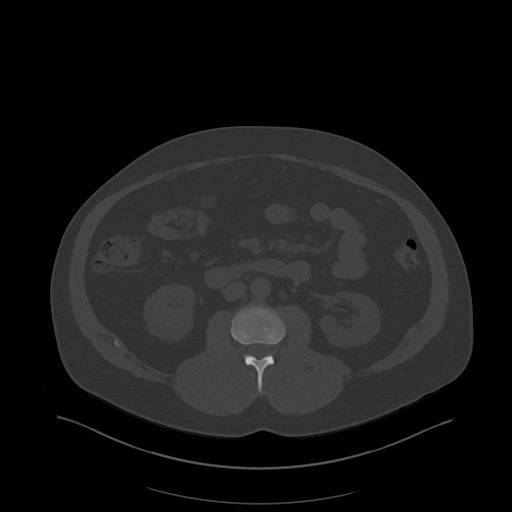
[im 88/121  soft-tissue]
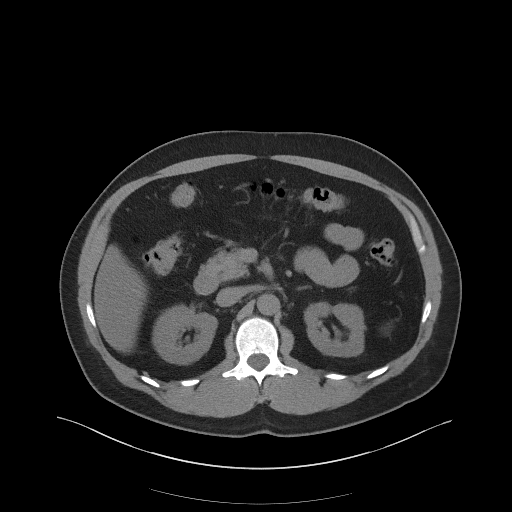
[im 97/121  soft-tissue]
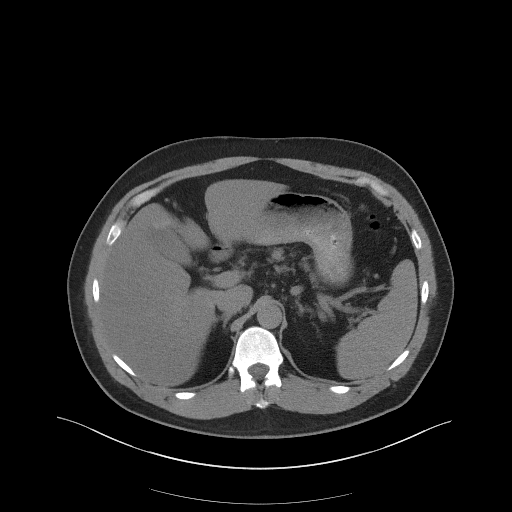
[im 107/121  soft-tissue]
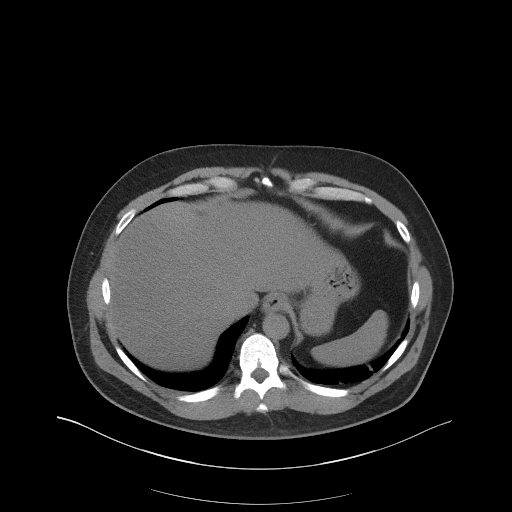
[im 116/121  soft-tissue]
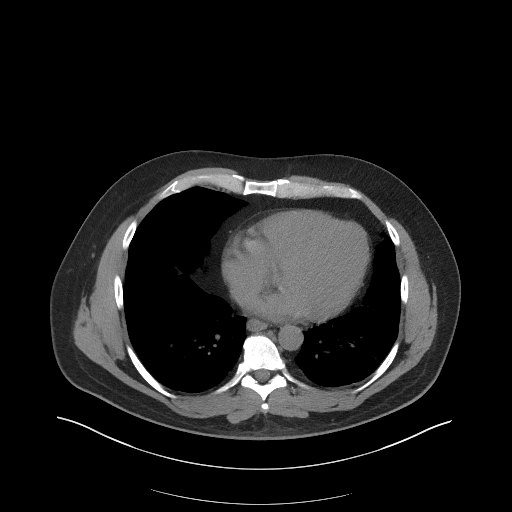

[Series 4: coronal st · coronal · 0.88mm/px · 3 of 115 slices shown]
[im 39/115  soft-tissue]
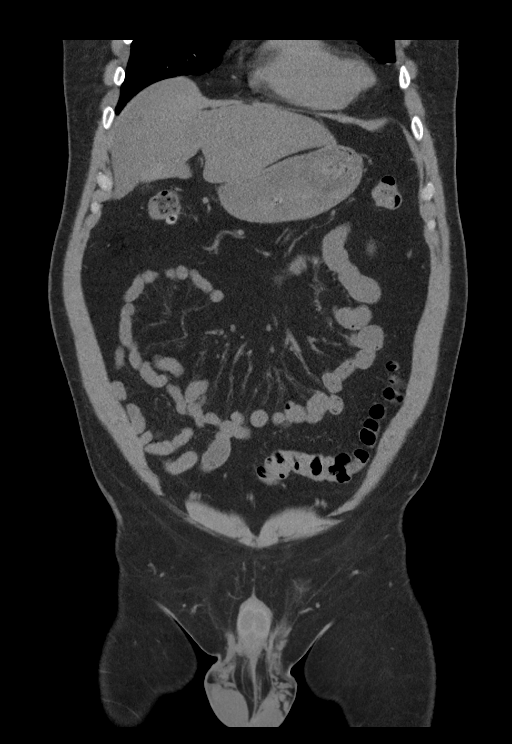
[im 51/115  soft-tissue]
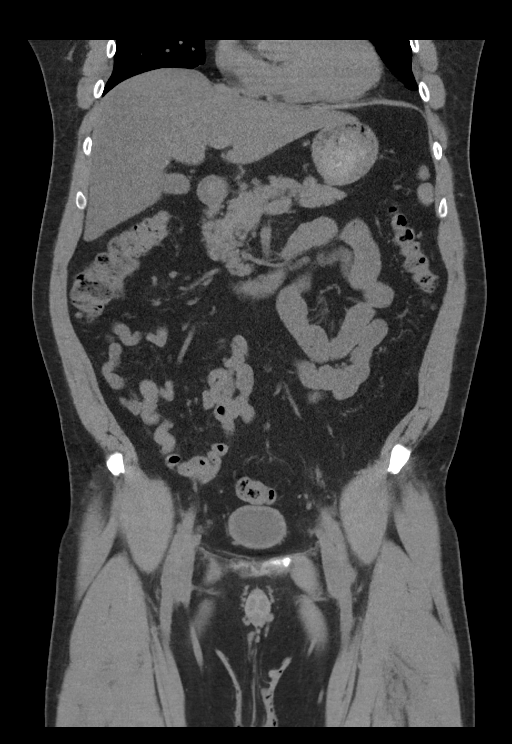
[im 64/115  soft-tissue]
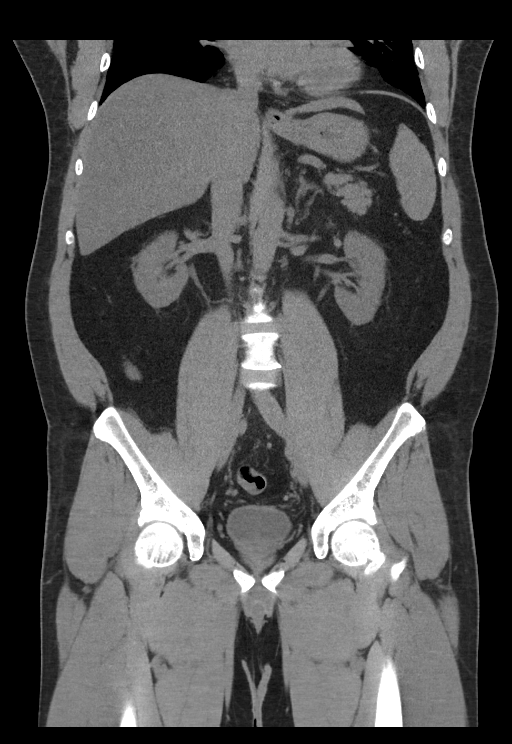

[16 of 46 positions shown; findings below may reference images not displayed]

FINDINGS: Visualized lung bases are unremarkable. No significant osseous
abnormality is noted.

Solitary gallstone is noted. Fatty infiltration of the liver is
noted. No focal abnormality is seen in the spleen or pancreas on
these unenhanced images. Adrenal glands and kidneys appear normal.
No hydronephrosis or renal obstruction is noted. No renal or
ureteral calculi are noted. There is no evidence of bowel
obstruction. The appendix appears normal. No abnormal fluid
collection is noted. Sigmoid diverticulosis is noted without
inflammation. Urinary bladder appears normal. Mild fat containing
left inguinal hernia is noted. No significant adenopathy is noted.
IMPRESSION: Fatty infiltration of the liver.

Small solitary gallstone.

Sigmoid diverticulosis without inflammation.

No hydronephrosis or renal obstruction is noted. No renal or
ureteral calculi are noted.

Small fat containing left inguinal hernia.

## 2016-09-14 ENCOUNTER — Other Ambulatory Visit: Payer: Self-pay | Admitting: Family Medicine

## 2016-10-17 DIAGNOSIS — K200 Eosinophilic esophagitis: Secondary | ICD-10-CM | POA: Diagnosis not present

## 2016-10-17 DIAGNOSIS — R110 Nausea: Secondary | ICD-10-CM | POA: Diagnosis not present

## 2016-10-17 DIAGNOSIS — R1314 Dysphagia, pharyngoesophageal phase: Secondary | ICD-10-CM | POA: Diagnosis not present

## 2016-10-23 DIAGNOSIS — Z7984 Long term (current) use of oral hypoglycemic drugs: Secondary | ICD-10-CM | POA: Diagnosis not present

## 2016-10-23 DIAGNOSIS — R001 Bradycardia, unspecified: Secondary | ICD-10-CM | POA: Diagnosis not present

## 2016-10-23 DIAGNOSIS — R079 Chest pain, unspecified: Secondary | ICD-10-CM | POA: Diagnosis not present

## 2016-10-23 DIAGNOSIS — R0789 Other chest pain: Secondary | ICD-10-CM | POA: Diagnosis not present

## 2016-10-23 DIAGNOSIS — K449 Diaphragmatic hernia without obstruction or gangrene: Secondary | ICD-10-CM | POA: Diagnosis not present

## 2016-10-23 DIAGNOSIS — K222 Esophageal obstruction: Secondary | ICD-10-CM | POA: Diagnosis not present

## 2016-10-23 DIAGNOSIS — K228 Other specified diseases of esophagus: Secondary | ICD-10-CM | POA: Diagnosis not present

## 2016-10-23 DIAGNOSIS — Z888 Allergy status to other drugs, medicaments and biological substances status: Secondary | ICD-10-CM | POA: Diagnosis not present

## 2016-10-23 DIAGNOSIS — R1310 Dysphagia, unspecified: Secondary | ICD-10-CM | POA: Diagnosis not present

## 2016-10-23 DIAGNOSIS — Z79899 Other long term (current) drug therapy: Secondary | ICD-10-CM | POA: Diagnosis not present

## 2016-10-23 DIAGNOSIS — K219 Gastro-esophageal reflux disease without esophagitis: Secondary | ICD-10-CM | POA: Diagnosis not present

## 2016-10-23 DIAGNOSIS — K200 Eosinophilic esophagitis: Secondary | ICD-10-CM | POA: Diagnosis not present

## 2016-10-23 LAB — HEPATIC FUNCTION PANEL
ALK PHOS: 61 (ref 25–125)
ALT: 28 (ref 10–40)
AST: 30 (ref 14–40)
BILIRUBIN, TOTAL: 0.8

## 2016-10-23 LAB — BASIC METABOLIC PANEL
BUN: 12 (ref 4–21)
Creatinine: 1.1 (ref 0.6–1.3)
Glucose: 132
POTASSIUM: 4.1 (ref 3.4–5.3)
SODIUM: 141 (ref 137–147)

## 2016-10-24 DIAGNOSIS — K760 Fatty (change of) liver, not elsewhere classified: Secondary | ICD-10-CM | POA: Diagnosis not present

## 2016-10-24 DIAGNOSIS — R161 Splenomegaly, not elsewhere classified: Secondary | ICD-10-CM | POA: Diagnosis not present

## 2016-10-24 DIAGNOSIS — R1013 Epigastric pain: Secondary | ICD-10-CM | POA: Diagnosis not present

## 2016-10-25 ENCOUNTER — Encounter: Payer: Self-pay | Admitting: Family Medicine

## 2016-10-25 ENCOUNTER — Ambulatory Visit (INDEPENDENT_AMBULATORY_CARE_PROVIDER_SITE_OTHER): Payer: BLUE CROSS/BLUE SHIELD | Admitting: Family Medicine

## 2016-10-25 VITALS — BP 122/77 | HR 68 | Ht 70.0 in | Wt 208.0 lb

## 2016-10-25 DIAGNOSIS — K760 Fatty (change of) liver, not elsewhere classified: Secondary | ICD-10-CM | POA: Diagnosis not present

## 2016-10-25 DIAGNOSIS — E1165 Type 2 diabetes mellitus with hyperglycemia: Secondary | ICD-10-CM

## 2016-10-25 DIAGNOSIS — I10 Essential (primary) hypertension: Secondary | ICD-10-CM

## 2016-10-25 DIAGNOSIS — IMO0001 Reserved for inherently not codable concepts without codable children: Secondary | ICD-10-CM

## 2016-10-25 DIAGNOSIS — L219 Seborrheic dermatitis, unspecified: Secondary | ICD-10-CM | POA: Diagnosis not present

## 2016-10-25 LAB — POCT GLYCOSYLATED HEMOGLOBIN (HGB A1C): Hemoglobin A1C: 5.4

## 2016-10-25 NOTE — Progress Notes (Signed)
Subjective:    CC: DM, liver  HPI:  Diabetes - no hypoglycemic events. No wounds or sores that are not healing well. No increased thirst or urination. Not checking glucose at home. Taking medications as prescribed without any side effects.   Fatty liver - due for repeat liver enzyme check.   Nicholas Christian also recently went to the emergency department at [facility] 2 days ago on September 11. That same day earlier he had an esophageal dilatation. Afterwards he started experiencing some severe chest pain that he had also contacted [employer] to eat and felt like maybe he got an impaction of food. Then started to experience substernal chest pain and epigastric pain. They sent him to the emergency room room from the GI office for further evaluation. They did do an ultrasound. They ruled out gallbladder stones. He did have a mildly enlarged spleen and a fatty liver which we know about.he did not have an esophageal tear the continued to have persistent pain. Even today he has some discomfort in the mid chest that is not as severe. The pain that was initially radiating to his back has improved awell. He did run a fever to 101 for the 2 days after the procedure. He said last night history she was 99.5.  He is still waking up 3 nights per week on average with vomiting and sometimes diarrhea. He does feel like greasy foods seem to be a trigger.  He also has a rash on his chest , in the middle that he wants me to look at. He says it is occasionally itchy.    Past medical history, Surgical history, Family history not pertinant except as noted below, Social history, Allergies, and medications have been entered into the medical record, reviewed, and corrections made.   Review of Systems: No fevers, chills, night sweats, weight loss, chest pain, or shortness of breath.   Objective:    General: Well Developed, well nourished, and in no acute distress.  Neuro: Alert and oriented x3, extra-ocular muscles intact,  sensation grossly intact.  HEENT: Normocephalic, atraumatic  Skin: Warm and dry. On the center of the chest between the pectoral over the area with a little bit more hair he has an erythematous maculopapular rash. No significant scale present. Cardiac: Regular rate and rhythm, no murmurs rubs or gallops, no lower extremity edema.  Respiratory: Clear to auscultation bilaterally. Not using accessory muscles, speaking in full sentences.   Impression and Recommendations:    DM - A1C of 5.9.  Well controlled. Continue current regimen. Follow up in  4 months.  Discussed need for statin.  Discussed several options. We could either continue with the current regimen for the next 3 months and if at that point he is doing really well try to drop off the Trulicity. We also discussed even holding a history of obesity for couple weeks just to make sure it's not controlled contributing to any of the vomiting or diarrhea that he is experiencing.   Fatty liver-due to recheck liver function.recent US confirms fatty liver.   Intermittent vomiting/diarrhea-food allergy testing was negative. Ultrasound of the gallbladder was negative. Discussed with him that he may want to consider a HIDA scan for further evaluation.  Seborrheic dermatitis-recommend a trial of over-the-counter cortisone cream. If that is not helping and we can try an antifungal and if that is not helping we can try topical antibacterial agent. He also feels like it might be affecting the area over his right eyebrow as well.

## 2016-11-07 ENCOUNTER — Other Ambulatory Visit: Payer: Self-pay | Admitting: Family Medicine

## 2016-11-28 ENCOUNTER — Encounter: Payer: Self-pay | Admitting: Family Medicine

## 2016-12-01 ENCOUNTER — Other Ambulatory Visit: Payer: Self-pay | Admitting: Family Medicine

## 2017-02-25 ENCOUNTER — Encounter: Payer: Self-pay | Admitting: Family Medicine

## 2017-02-25 ENCOUNTER — Ambulatory Visit: Payer: BLUE CROSS/BLUE SHIELD | Admitting: Family Medicine

## 2017-02-25 VITALS — BP 125/80 | HR 68 | Ht 70.0 in | Wt 222.0 lb

## 2017-02-25 DIAGNOSIS — Z125 Encounter for screening for malignant neoplasm of prostate: Secondary | ICD-10-CM | POA: Diagnosis not present

## 2017-02-25 DIAGNOSIS — IMO0001 Reserved for inherently not codable concepts without codable children: Secondary | ICD-10-CM

## 2017-02-25 DIAGNOSIS — E1165 Type 2 diabetes mellitus with hyperglycemia: Secondary | ICD-10-CM | POA: Diagnosis not present

## 2017-02-25 DIAGNOSIS — I10 Essential (primary) hypertension: Secondary | ICD-10-CM

## 2017-02-25 DIAGNOSIS — L219 Seborrheic dermatitis, unspecified: Secondary | ICD-10-CM | POA: Diagnosis not present

## 2017-02-25 DIAGNOSIS — E119 Type 2 diabetes mellitus without complications: Secondary | ICD-10-CM | POA: Diagnosis not present

## 2017-02-25 LAB — POCT GLYCOSYLATED HEMOGLOBIN (HGB A1C): Hemoglobin A1C: 6.1

## 2017-02-25 MED ORDER — KETOCONAZOLE 2 % EX CREA: 1.0000 "application " | TOPICAL_CREAM | Freq: Two times a day (BID) | CUTANEOUS | 0 refills | Status: DC

## 2017-02-25 NOTE — Progress Notes (Signed)
Subjective:    CC: Diabetes, blood pressure, rash  HPI:  Diabetes - no hypoglycemic events. No wounds or sores that are not healing well. No increased thirst or urination. Checking glucose at home. Taking medications as prescribed without any side effects.  He is not currently running as much as he was previously because of the cold weather but plans on getting back on track.  Hypertension- Pt denies chest pain, SOB, dizziness, or heart palpitations.  Taking meds as directed w/o problems.  Denies medication side effects.    Rash -he also complains about a rash just above his eyebrows bilaterally, going down the nasal bridge, and behind his ears.  He is also getting a little bit of it on the center of his chest.  He is tried some over-the-counter hydrocortisone and says initially it seems to help that it suddenly gets worse.   Past medical history, Surgical history, Family history not pertinant except as noted below, Social history, Allergies, and medications have been entered into the medical record, reviewed, and corrections made.   Review of Systems: No fevers, chills, night sweats, weight loss, chest pain, or shortness of breath.   Objective:    General: Well Developed, well nourished, and in no acute distress.  Neuro: Alert and oriented x3, extra-ocular muscles intact, sensation grossly intact.  HEENT: Normocephalic, atraumatic  Skin: Warm and dry.  Does have an erythematous and most pink scaly rash just along the eyebrows bilaterally down the nasal bridge, and behind both ears.  The scale is fairly large. Cardiac: Regular rate and rhythm, no murmurs rubs or gallops, no lower extremity edema.  Respiratory: Clear to auscultation bilaterally. Not using accessory muscles, speaking in full sentences.   Impression and Recommendations:    DM -well controlled.  Hemoglobin A1c up to 6.1 from previous of 5.4 but he is also gained a significant amount of weight back, has been eating more carbs  over the holidays and has not been exercising as consistently.  F/U in 4 months.    HTN - Well controlled. Continue current regimen. Follow up in  6 months.  Due CMP.  Seborrheic dermatitis-we will treat with ketoconazole 2% cream.  If not improving over the next 2-3 weeks and please let me know.  We can always add back a small amount of steroid if needed.  Due for screening PSA.  Lab slip ordered today.

## 2017-04-17 DIAGNOSIS — R635 Abnormal weight gain: Secondary | ICD-10-CM | POA: Diagnosis not present

## 2017-04-17 DIAGNOSIS — E291 Testicular hypofunction: Secondary | ICD-10-CM | POA: Diagnosis not present

## 2017-04-18 DIAGNOSIS — R5383 Other fatigue: Secondary | ICD-10-CM | POA: Diagnosis not present

## 2017-04-18 DIAGNOSIS — Z1339 Encounter for screening examination for other mental health and behavioral disorders: Secondary | ICD-10-CM | POA: Diagnosis not present

## 2017-04-18 DIAGNOSIS — E291 Testicular hypofunction: Secondary | ICD-10-CM | POA: Diagnosis not present

## 2017-04-18 DIAGNOSIS — I10 Essential (primary) hypertension: Secondary | ICD-10-CM | POA: Diagnosis not present

## 2017-04-18 DIAGNOSIS — R4586 Emotional lability: Secondary | ICD-10-CM | POA: Diagnosis not present

## 2017-04-18 DIAGNOSIS — E119 Type 2 diabetes mellitus without complications: Secondary | ICD-10-CM | POA: Diagnosis not present

## 2017-04-18 DIAGNOSIS — Z1331 Encounter for screening for depression: Secondary | ICD-10-CM | POA: Diagnosis not present

## 2017-04-18 DIAGNOSIS — M2550 Pain in unspecified joint: Secondary | ICD-10-CM | POA: Diagnosis not present

## 2017-04-19 ENCOUNTER — Other Ambulatory Visit: Payer: Self-pay | Admitting: Family Medicine

## 2017-04-19 DIAGNOSIS — IMO0001 Reserved for inherently not codable concepts without codable children: Secondary | ICD-10-CM

## 2017-04-19 DIAGNOSIS — I10 Essential (primary) hypertension: Secondary | ICD-10-CM

## 2017-04-19 DIAGNOSIS — E1165 Type 2 diabetes mellitus with hyperglycemia: Principal | ICD-10-CM

## 2017-05-01 DIAGNOSIS — E119 Type 2 diabetes mellitus without complications: Secondary | ICD-10-CM | POA: Diagnosis not present

## 2017-05-08 DIAGNOSIS — M2550 Pain in unspecified joint: Secondary | ICD-10-CM | POA: Diagnosis not present

## 2017-05-08 DIAGNOSIS — E291 Testicular hypofunction: Secondary | ICD-10-CM | POA: Diagnosis not present

## 2017-05-08 DIAGNOSIS — R5383 Other fatigue: Secondary | ICD-10-CM | POA: Diagnosis not present

## 2017-05-08 DIAGNOSIS — E119 Type 2 diabetes mellitus without complications: Secondary | ICD-10-CM | POA: Diagnosis not present

## 2017-05-14 LAB — HM DIABETES EYE EXAM

## 2017-06-18 ENCOUNTER — Encounter: Payer: Self-pay | Admitting: Family Medicine

## 2017-06-21 ENCOUNTER — Other Ambulatory Visit: Payer: BLUE CROSS/BLUE SHIELD

## 2017-06-27 ENCOUNTER — Inpatient Hospital Stay: Payer: BLUE CROSS/BLUE SHIELD

## 2017-07-01 ENCOUNTER — Ambulatory Visit: Payer: BLUE CROSS/BLUE SHIELD | Admitting: Family Medicine

## 2017-07-01 ENCOUNTER — Encounter: Payer: Self-pay | Admitting: Family Medicine

## 2017-07-01 VITALS — BP 132/83 | HR 66 | Ht 70.0 in | Wt 217.0 lb

## 2017-07-01 DIAGNOSIS — Z6830 Body mass index (BMI) 30.0-30.9, adult: Secondary | ICD-10-CM | POA: Diagnosis not present

## 2017-07-01 DIAGNOSIS — E119 Type 2 diabetes mellitus without complications: Secondary | ICD-10-CM

## 2017-07-01 DIAGNOSIS — I10 Essential (primary) hypertension: Secondary | ICD-10-CM | POA: Diagnosis not present

## 2017-07-01 LAB — POCT GLYCOSYLATED HEMOGLOBIN (HGB A1C): Hemoglobin A1C: 5.8

## 2017-07-01 NOTE — Progress Notes (Signed)
Subjective:    CC: DM, HTN  HPI: Diabetes - no hypoglycemic events. No wounds or sores that are not healing well. No increased thirst or urination. Checking glucose at home. Taking medications as prescribed without any side effects.  Hypertension- Pt denies chest pain, SOB or heart palpitations.  He has felt dizzy occasionally.  Taking meds as directed w/o problems.  Denies medication side effects.    Abnormal weight gain - he is going to [facility] weight loss.  He has Artie lost about 20 pounds.  He is doing the testosterone pellet but is not taking phentermine.  He says he is actually had more energy and has been sleeping better.  Past medical history, Surgical history, Family history not pertinant except as noted below, Social history, Allergies, and medications have been entered into the medical record, reviewed, and corrections made.   Review of Systems: No fevers, chills, night sweats, weight loss, chest pain, or shortness of breath.   Objective:    General: Well Developed, well nourished, and in no acute distress.  Neuro: Alert and oriented x3, extra-ocular muscles intact, sensation grossly intact.  HEENT: Normocephalic, atraumatic  Skin: Warm and dry, no rashes. Cardiac: Regular rate and rhythm, no murmurs rubs or gallops, no lower extremity edema.  Respiratory: Clear to auscultation bilaterally. Not using accessory muscles, speaking in full sentences.   Impression and Recommendations:    DM - Well controlled. Continue current regimen. Follow up in 4 months. Will D/C the trulicity, since his W2B looks absolutely fantastic today.  Down to 5.8. Lab Results  Component Value Date   HGBA1C 5.8 07/01/2017   .  HTN - Well controlled.  Okay to cut lisinopril in half if he is noticing increased recurrence of dizziness.  Continue current regimen. Follow up in  6 months.    Abnormal weight gain/BMI 31-he has done fantastic and has lost 10 pounds.  Continue to work on diet and exercise  and staying healthy.  Hopefully I will be able to continue to decrease his medications as he gets to ideal weight around 195 pounds.

## 2017-07-17 ENCOUNTER — Telehealth: Payer: Self-pay | Admitting: Family Medicine

## 2017-07-17 NOTE — Telephone Encounter (Signed)
Call pt: basedon currnet diabetic guideline I would strongly recommend a statin at beditme.  It can reduce risk of heart attack by 20% in patient with diabetes. If he is ok with it please let me know.

## 2017-07-18 NOTE — Telephone Encounter (Signed)
Patient would like to wait on starting a statin.

## 2017-09-12 ENCOUNTER — Other Ambulatory Visit: Payer: Self-pay | Admitting: Family Medicine

## 2017-10-09 ENCOUNTER — Telehealth: Payer: Self-pay | Admitting: Family Medicine

## 2017-10-09 DIAGNOSIS — IMO0001 Reserved for inherently not codable concepts without codable children: Secondary | ICD-10-CM

## 2017-10-09 DIAGNOSIS — E7800 Pure hypercholesterolemia, unspecified: Secondary | ICD-10-CM

## 2017-10-09 DIAGNOSIS — E1165 Type 2 diabetes mellitus with hyperglycemia: Secondary | ICD-10-CM

## 2017-10-09 DIAGNOSIS — I10 Essential (primary) hypertension: Secondary | ICD-10-CM

## 2017-10-09 NOTE — Telephone Encounter (Signed)
Call patient let him know that based on current guidelines all diabetics should be on a statin at bedtime to help reduce the risk for heart attack and stroke.  I would like for him to consider starting such medication.  If he is okay with that we can at least try a low-dose statin and just make sure that he tolerates it well.  Plus he is due for blood work including a CMP lipid panel.  We can always check this once he has been on the medication for couple weeks.

## 2017-10-10 MED ORDER — ATORVASTATIN CALCIUM 20 MG PO TABS: 20.0000 mg | ORAL_TABLET | Freq: Every day | ORAL | 3 refills | Status: DC

## 2017-10-10 NOTE — Telephone Encounter (Signed)
Rx sent 

## 2017-10-10 NOTE — Telephone Encounter (Signed)
Rebel agreed to start a statin and come in for labs. Ordered labs.

## 2017-11-04 ENCOUNTER — Ambulatory Visit: Payer: BLUE CROSS/BLUE SHIELD | Admitting: Family Medicine

## 2017-12-27 ENCOUNTER — Other Ambulatory Visit: Payer: Self-pay | Admitting: Family Medicine

## 2017-12-27 DIAGNOSIS — I10 Essential (primary) hypertension: Secondary | ICD-10-CM

## 2017-12-27 DIAGNOSIS — E1165 Type 2 diabetes mellitus with hyperglycemia: Principal | ICD-10-CM

## 2017-12-27 DIAGNOSIS — IMO0001 Reserved for inherently not codable concepts without codable children: Secondary | ICD-10-CM

## 2018-02-03 ENCOUNTER — Other Ambulatory Visit: Payer: Self-pay | Admitting: Family Medicine

## 2018-02-06 NOTE — Telephone Encounter (Signed)
NEEDS APPOINTMENT BEFORE ANY FURTHER REFILLS

## 2018-03-13 ENCOUNTER — Other Ambulatory Visit: Payer: Self-pay

## 2018-03-13 ENCOUNTER — Emergency Department (INDEPENDENT_AMBULATORY_CARE_PROVIDER_SITE_OTHER): Payer: BLUE CROSS/BLUE SHIELD

## 2018-03-13 ENCOUNTER — Emergency Department
Admission: EM | Admit: 2018-03-13 | Discharge: 2018-03-13 | Disposition: A | Discharge status: Home / Self Care | Payer: BLUE CROSS/BLUE SHIELD | Source: Home / Self Care

## 2018-03-13 DIAGNOSIS — K8020 Calculus of gallbladder without cholecystitis without obstruction: Secondary | ICD-10-CM | POA: Diagnosis not present

## 2018-03-13 DIAGNOSIS — K5790 Diverticulosis of intestine, part unspecified, without perforation or abscess without bleeding: Secondary | ICD-10-CM

## 2018-03-13 DIAGNOSIS — R81 Glycosuria: Secondary | ICD-10-CM

## 2018-03-13 DIAGNOSIS — R109 Unspecified abdominal pain: Secondary | ICD-10-CM

## 2018-03-13 DIAGNOSIS — B356 Tinea cruris: Secondary | ICD-10-CM

## 2018-03-13 LAB — POCT CBC W AUTO DIFF (K'VILLE URGENT CARE)

## 2018-03-13 LAB — POCT URINALYSIS DIP (MANUAL ENTRY)
Bilirubin, UA: NEGATIVE
Blood, UA: NEGATIVE
Glucose, UA: 500 mg/dL — AB
Ketones, POC UA: NEGATIVE mg/dL
Leukocytes, UA: NEGATIVE
Nitrite, UA: NEGATIVE
Protein Ur, POC: 30 mg/dL — AB
Spec Grav, UA: 1.025 (ref 1.010–1.025)
Urobilinogen, UA: 0.2 E.U./dL
pH, UA: 6.5 (ref 5.0–8.0)

## 2018-03-13 LAB — POCT FASTING CBG KUC MANUAL ENTRY: POCT Glucose (KUC): 231 mg/dL — AB (ref 70–99)

## 2018-03-13 MED ORDER — CLOTRIMAZOLE 1 % EX CREA: TOPICAL_CREAM | CUTANEOUS | 0 refills | Status: DC

## 2018-03-13 NOTE — ED Triage Notes (Signed)
Pt is having left sided lower back pain.  Hx of kidney stones.  Has been going on for about 2 months off and on.  Sometimes it radiates to side and front.

## 2018-03-13 NOTE — Discharge Instructions (Signed)
°  You may take 500mg  acetaminophen every 4-6 hours or in combination with ibuprofen 400-600mg  every 6-8 hours as needed for pain and inflammation.  Be sure to stay well hydrated.  Please follow up with family medicine in 1 week if not improving, sooner if worsening.

## 2018-03-13 NOTE — ED Provider Notes (Signed)
[name]    CSN: [id number] Arrival date & time: 03/13/18  1011     History   Chief Complaint Chief Complaint  Patient presents with  . Back Pain    HPI Nicholas Christian is a 53 y.o. male.   HPI Nicholas Christian is a 53 y.o. male presenting to UC with c/o Left sided lower back pain that started about 2 months ago.  Hx of kidney stones.  He believes he passed one a few weeks ago but he is concerned the pain has persisted. He has had laser treatments before for stones, performed by [facility]. Denies fever, chills, n/v/d. No rashes. No known injury.   Pt also reports a red itchy rash in Right groin that started about 1-2 weeks ago. He tried OTC creams with temporary relief.    Past Medical History:  Diagnosis Date  . Complication of anesthesia    low O2 sat after kidney surgery ~ 2009  . DDD (degenerative disc disease), cervical   . Diabetes mellitus without complication ([place])   . Diverticulitis   . Eosinophilic esophagitis    hx of- [facility]  . GERD (gastroesophageal reflux disease)   . High triglycerides   . History of hiatal hernia   . History of kidney stones    hx  . Low HDL (under 40)   . Low testosterone   . Migraines   . Normal cardiac stress test    DOBUTAMINE STRESS ECHO 10 years ago   . OSA (obstructive sleep apnea)    no cpap used  . Pneumonia 10/15   no problems now    Patient Active Problem List   Diagnosis Date Noted  . History of Clostridium difficile colitis 05/09/2015  . LLQ abdominal pain 05/05/2015  . Herniated nucleus pulposus, thoracic 01/18/2014  . Eosinophilic esophagitis 48/54/6270  . Obesity 02/16/2011  . Controlled type 2 diabetes mellitus without complication, without long-term current use of insulin ([place]) 02/07/2011  . OSA (obstructive sleep apnea) 11/16/2009  . ESSENTIAL HYPERTENSION, BENIGN 11/16/2009  . BACK PAIN, THORACIC REGION 11/24/2007  . DIVERTICULOSIS OF COLON 06/25/2007  . Fatty liver disease, nonalcoholic  35/00/9381  . GAD (generalized anxiety disorder) 11/20/2005  . MIGRAINE, UNSPEC., W/O INTRACTABLE MIGRAINE 11/20/2005  . KIDNEY STONE - NEPHROLITHIASIS 11/20/2005    Past Surgical History:  Procedure Laterality Date  . EDG  01/03/12  . ESOPHAGEAL DILATION  07/18/11   Dr. Tora Duck  . KIDNEY STONE SURGERY     laser at [facility] MICRODISCECTOMY Left 01/18/2014   Procedure: Left Thoracic five-six microdiskectomy;  Surgeon: Newman Pies, MD;  Location: [facility];  Service: Neurosurgery;  Laterality: Left;  . NASAL SINUS SURGERY     Removal polyps   . TONSILLECTOMY AND ADENOIDECTOMY    . TYMPANOSTOMY TUBE PLACEMENT         Home Medications    Prior to Admission medications   Medication Sig Start Date End Date Taking? Authorizing Provider  AMBULATORY NON FORMULARY MEDICATION Medication Name: Glucometer and strips and lancet to check once a day.  Dx 250.00 12/10/14   Hali Marry, MD  atorvastatin (LIPITOR) 20 MG tablet Take 1 tablet (20 mg total) by mouth daily. 10/10/17   Hali Marry, MD  clotrimazole (LOTRIMIN) 1 % cream Apply to affected area 2 times daily 03/13/18   Noe Gens, PA-C  Insulin Pen Needle (PEN NEEDLES) 31G X 8 MM MISC Use for administraiton of  Victoza daily. 06/27/15   Hali Marry, MD  ketoconazole (NIZORAL) 2 % cream Apply 1 application topically 2 (two) times daily. X 3 weeks. 02/25/17   Hali Marry, MD  lisinopril (PRINIVIL,ZESTRIL) 10 MG tablet TAKE 1 TABLET(10 MG) BY MOUTH DAILY 12/30/17   Hali Marry, MD  omeprazole (PRILOSEC) 20 MG capsule  10/23/16   [provider]  Saxagliptin-Metformin (KOMBIGLYZE XR) 06-998 MG TB24 Take 1 tablet by mouth daily. 02/06/18   Hali Marry, MD    Family History Family History  Problem Relation Age of Onset  . Prostate cancer Father        oldr when dx   . Diabetes Maternal Grandmother     Social  History Social History   Tobacco Use  . Smoking status: Never Smoker  . Smokeless tobacco: Never Used  Substance Use Topics  . Alcohol use: No  . Drug use: No     Allergies   Niacin and Onglyza [saxagliptin]   Review of Systems Review of Systems  Genitourinary: Positive for flank pain (Left). Negative for dysuria, frequency and hematuria.  Musculoskeletal: Positive for back pain and myalgias.  Skin: Negative for color change and rash.  Neurological: Negative for weakness and numbness.     Physical Exam Triage Vital Signs ED Triage Vitals  Enc Vitals Group     BP 03/13/18 1030 (!) 151/98     Pulse Rate 03/13/18 1030 64     Resp 03/13/18 1030 18     Temp 03/13/18 1030 97.6 F (36.4 C)     Temp Source 03/13/18 1030 Oral     SpO2 03/13/18 1030 98 %     Weight 03/13/18 1033 246 lb (111.6 kg)     Height 03/13/18 1033 5\' 11"  (1.803 m)     Head Circumference --      Peak Flow --      Pain Score 03/13/18 1032 3     Pain Loc --      Pain Edu? --      Excl. in [place]? --    No data found.  Updated Vital Signs BP (!) 151/98 (BP Location: Right Arm)   Pulse 64   Temp 97.6 F (36.4 C) (Oral)   Resp 18   Ht 5\' 11"  (1.803 m)   Wt 246 lb (111.6 kg)   SpO2 98%   BMI 34.31 kg/m   Visual Acuity Right Eye Distance:   Left Eye Distance:   Bilateral Distance:    Right Eye Near:   Left Eye Near:    Bilateral Near:     Physical Exam Vitals signs and nursing note reviewed.  Constitutional:      Appearance: Normal appearance. He is well-developed.  HENT:     Head: Normocephalic and atraumatic.  Neck:     Musculoskeletal: Normal range of motion.  Cardiovascular:     Rate and Rhythm: Normal rate and regular rhythm.  Pulmonary:     Effort: Pulmonary effort is normal.     Breath sounds: Normal breath sounds.  Abdominal:     General: There is no distension.     Palpations: Abdomen is soft.     Tenderness: There is no abdominal tenderness. There is no right CVA  tenderness or left CVA tenderness.  Musculoskeletal: Normal range of motion.     Comments: No spinal tenderness. Mild tenderness to Left flank.  Skin:    General: Skin is warm and dry.     Findings: No  erythema or rash.     Comments: Right groin: erythematous macular moist rash, minimally tender.  Neurological:     Mental Status: He is alert and oriented to person, place, and time.  Psychiatric:        Behavior: Behavior normal.      UC Treatments / Results  Labs (all labs ordered are listed, but only abnormal results are displayed) Labs Reviewed  POCT URINALYSIS DIP (MANUAL ENTRY) - Abnormal; Notable for the following components:      Result Value   Glucose, UA =500 (*)    Protein Ur, POC =30 (*)    All other components within normal limits  POCT FASTING CBG [facility] MANUAL ENTRY - Abnormal; Notable for the following components:   POCT Glucose ([facility]) 231 (*)    All other components within normal limits  COMPLETE METABOLIC PANEL WITH GFR  POCT CBC W AUTO DIFF ([facility])  POCT FASTING CBG [facility] MANUAL ENTRY    EKG None  Radiology Ct Renal Stone Study  Result Date: 03/13/2018 CLINICAL DATA:  Left flank pain for 3 months EXAM: CT ABDOMEN AND PELVIS WITHOUT CONTRAST TECHNIQUE: Multidetector CT imaging of the abdomen and pelvis was performed following the standard protocol without IV contrast. COMPARISON:  July 14, 2015 FINDINGS: Lower chest: Heart size is enlarged. Mild atelectasis of the lung bases are noted. Hepatobiliary: There is diffuse low density of the liver without focal liver lesion. Gallstone is noted in the gallbladder. The biliary tree is normal. Pancreas: Unremarkable. No pancreatic ductal dilatation or surrounding inflammatory changes. Spleen: Normal in size without focal abnormality. Adrenals/Urinary Tract: Adrenal glands are unremarkable. Kidneys are normal, without renal calculi, focal lesion, or hydronephrosis. Bladder is unremarkable. Stomach/Bowel: Stomach is  within normal limits. Appendix appears normal. No evidence of bowel wall thickening, distention, or inflammatory changes. There is diverticulosis of colon without diverticulitis. Vascular/Lymphatic: No significant vascular findings are present. No enlarged abdominal or pelvic lymph nodes. Reproductive: Prostate calcifications are identified. Other: Bilateral inguinal herniation of mesenteric fat and midline umbilical herniation of mesenteric fat are noted. Musculoskeletal: No acute abnormality. IMPRESSION: No nephrolithiasis or hydroureteronephrosis bilaterally. Diverticulosis of colon without diverticulitis. Fatty infiltration of liver. Cholelithiasis. Electronically Signed   By: Abelardo Diesel M.D.   On: 03/13/2018 11:24    Procedures Procedures (including critical care time)  Medications Ordered in UC Medications - No data to display  Initial Impression / Assessment and Plan / UC Course  I have reviewed the triage vital signs and the nursing notes.  Pertinent labs & imaging results that were available during my care of the patient were reviewed by me and considered in my medical decision making (see chart for details).     Discussed imaging with pt. No evidence of renal stones but there is cholelithiasis noted. CBC: WNL CMP: pending Encouraged f/u with PCP as needed. Offered prescription pain medication, pt declined.   Rash c/w tinea cruris  Encouraged f/u with PCP as needed  Final Clinical Impressions(s) / UC Diagnoses   Final diagnoses:  Calculus of gallbladder without cholecystitis without obstruction  Glucose found in urine on examination  Left flank pain  Jock itch     Discharge Instructions      You may take 500mg  acetaminophen every 4-6 hours or in combination with ibuprofen 400-600mg  every 6-8 hours as needed for pain and inflammation.  Be sure to stay well hydrated.  Please follow up with family medicine in 1 week if not improving, sooner if worsening.  ED Prescriptions    Medication Sig Dispense Auth. Provider   clotrimazole (LOTRIMIN) 1 % cream Apply to affected area 2 times daily 15 g Noe Gens, PA-C     Controlled Substance Prescriptions [place] Controlled Substance Registry consulted? Not Applicable   Tyrell Antonio 03/13/18 3013

## 2018-03-14 ENCOUNTER — Telehealth: Payer: Self-pay | Admitting: Emergency Medicine

## 2018-03-14 LAB — COMPLETE METABOLIC PANEL WITH GFR
AG Ratio: 2.2 (calc) (ref 1.0–2.5)
ALT: 53 U/L — ABNORMAL HIGH (ref 9–46)
AST: 31 U/L (ref 10–35)
Albumin: 4.4 g/dL (ref 3.6–5.1)
Alkaline phosphatase (APISO): 82 U/L (ref 40–115)
BUN: 10 mg/dL (ref 7–25)
CO2: 26 mmol/L (ref 20–32)
Calcium: 9.1 mg/dL (ref 8.6–10.3)
Chloride: 101 mmol/L (ref 98–110)
Creat: 1.27 mg/dL (ref 0.70–1.33)
GFR, Est African American: 75 mL/min/{1.73_m2} (ref >=60)
GFR, Est Non African American: 65 mL/min/{1.73_m2} (ref >=60)
Globulin: 2 g/dL (calc) (ref 1.9–3.7)
Glucose, Bld: 245 mg/dL — ABNORMAL HIGH (ref 65–99)
Potassium: 4.2 mmol/L (ref 3.5–5.3)
Sodium: 140 mmol/L (ref 135–146)
Total Bilirubin: 0.7 mg/dL (ref 0.2–1.2)
Total Protein: 6.4 g/dL (ref 6.1–8.1)

## 2018-03-14 NOTE — Telephone Encounter (Signed)
Spoke with patient; went over labs; suggested he follow with PCP soon; also he asked about rx for lotrimin not filled; called [facility] and they state his insurance will not cover because it is available OTC.

## 2018-03-14 NOTE — Telephone Encounter (Signed)
Message left on voice mail inquiring about patient's status and encouraging patient to call with questions/concerns.  

## 2018-03-17 ENCOUNTER — Ambulatory Visit: Payer: BLUE CROSS/BLUE SHIELD | Admitting: Family Medicine

## 2018-03-17 ENCOUNTER — Encounter: Payer: Self-pay | Admitting: Family Medicine

## 2018-03-17 VITALS — BP 159/93 | HR 67 | Ht 70.0 in | Wt 245.0 lb

## 2018-03-17 DIAGNOSIS — I517 Cardiomegaly: Secondary | ICD-10-CM

## 2018-03-17 DIAGNOSIS — R21 Rash and other nonspecific skin eruption: Secondary | ICD-10-CM | POA: Diagnosis not present

## 2018-03-17 DIAGNOSIS — IMO0001 Reserved for inherently not codable concepts without codable children: Secondary | ICD-10-CM

## 2018-03-17 DIAGNOSIS — M545 Low back pain, unspecified: Secondary | ICD-10-CM

## 2018-03-17 DIAGNOSIS — E1165 Type 2 diabetes mellitus with hyperglycemia: Secondary | ICD-10-CM

## 2018-03-17 DIAGNOSIS — K4020 Bilateral inguinal hernia, without obstruction or gangrene, not specified as recurrent: Secondary | ICD-10-CM

## 2018-03-17 DIAGNOSIS — K429 Umbilical hernia without obstruction or gangrene: Secondary | ICD-10-CM

## 2018-03-17 LAB — POCT GLYCOSYLATED HEMOGLOBIN (HGB A1C): Hemoglobin A1C: 7.9 % — AB (ref 4.0–5.6)

## 2018-03-17 MED ORDER — DULAGLUTIDE 0.75 MG/0.5ML ~~LOC~~ SOAJ: 0.7500 mg | SUBCUTANEOUS | 3 refills | Status: DC

## 2018-03-17 MED ORDER — KETOCONAZOLE 2 % EX CREA: 1.0000 "application " | TOPICAL_CREAM | Freq: Two times a day (BID) | CUTANEOUS | 0 refills | Status: DC

## 2018-03-17 NOTE — Progress Notes (Addendum)
Established Patient Office Visit  Subjective:  Patient ID: Nicholas Christian, male    DOB: [birth year]  Age: 53 y.o. MRN: [id number]  CC:  Chief Complaint  Patient presents with  . Follow-up    [name] presents for follow-up from recent urgent care visit on January 30.  He initially presented to urgent care complaining of left-sided low back pain that started about 2 months prior.  He did have a prior history of kidney stones.  In fact he thought he had actually passed one a few weeks prior.  So presented with a red itchy rash in the right side of the groin that it started about 1 to 2 weeks prior.  He had Artie tried some over-the-counter creams.  He did have a CT renal protocol performed which did not show any renal stones but did show some cholelithiasis.  He was treated for the rash as well with clotrimazole cream.  He says he never actually picked it up because he was told that it was over-the-counter and the over-the-counter creams that he has been using seem to be helping so he is just stuck with that.  Continued to have some pain in his left low back.  He does not remember specific injury or trauma.  He says it can be sore to touch at times.  Diabetes - no hypoglycemic events. No wounds or sores that are not healing well. No increased thirst or urination. Checking glucose at home. Taking medications as prescribed without any side effects.  He was also noted on the CT T below to confirm to bilateral inguinal hernias as well as an umbilical hernia.  He says he has noticed a knot just above the umbilicus.  In fact we had referred him at one point for surgical correction and he went for the consultation they were actually supposed to call him back to schedule for the surgery and he says they never did.   CT Renal Stone StudyPerformed 03/13/2018 Final result  Study Result CLINICAL DATA: Left flank pain for 3 months  EXAM: CT ABDOMEN AND PELVIS WITHOUT  CONTRAST  TECHNIQUE: Multidetector CT imaging of the abdomen and pelvis was performed following the standard protocol without IV contrast.  COMPARISON: July 14, 2015  FINDINGS: Lower chest: Heart size is enlarged. Mild atelectasis of the lung bases are noted.  Hepatobiliary: There is diffuse low density of the liver without focal liver lesion. Gallstone is noted in the gallbladder. The biliary tree is normal.  Pancreas: Unremarkable. No pancreatic ductal dilatation or surrounding inflammatory changes.  Spleen: Normal in size without focal abnormality.  Adrenals/Urinary Tract: Adrenal glands are unremarkable. Kidneys are normal, without renal calculi, focal lesion, or hydronephrosis. Bladder is unremarkable.  Stomach/Bowel: Stomach is within normal limits. Appendix appears normal. No evidence of bowel wall thickening, distention, or inflammatory changes. There is diverticulosis of colon without diverticulitis.  Vascular/Lymphatic: No significant vascular findings are present. No enlarged abdominal or pelvic lymph nodes.  Reproductive: Prostate calcifications are identified.  Other: Bilateral inguinal herniation of mesenteric fat and midline umbilical herniation of mesenteric fat are noted.  Musculoskeletal: No acute abnormality.  IMPRESSION: No nephrolithiasis or hydroureteronephrosis bilaterally.  Diverticulosis of colon without diverticulitis.  Fatty infiltration of liver.  Cholelithiasis.   Electronically Signed By: Abelardo Diesel M.D. On: 03/13/2018 11:24   Past Medical History:  Diagnosis Date  . Complication of anesthesia    low O2 sat after kidney surgery ~ 2009  . DDD (degenerative disc disease), cervical   .  Diabetes mellitus without complication ([place])   . Diverticulitis   . Eosinophilic esophagitis    hx of- [facility]  . GERD (gastroesophageal reflux disease)   . High triglycerides   . History of hiatal hernia   . History of kidney stones    hx   . Low HDL (under 40)   . Low testosterone   . Migraines   . Normal cardiac stress test    DOBUTAMINE STRESS ECHO 10 years ago   . OSA (obstructive sleep apnea)    no cpap used  . Pneumonia 10/15   no problems now    Past Surgical History:  Procedure Laterality Date  . EDG  01/03/12  . ESOPHAGEAL DILATION  07/18/11   Dr. Tora Duck  . KIDNEY STONE SURGERY     laser at [facility] MICRODISCECTOMY Left 01/18/2014   Procedure: Left Thoracic five-six microdiskectomy;  Surgeon: Newman Pies, MD;  Location: [facility];  Service: Neurosurgery;  Laterality: Left;  . NASAL SINUS SURGERY     Removal polyps   . TONSILLECTOMY AND ADENOIDECTOMY    . TYMPANOSTOMY TUBE PLACEMENT      Family History  Problem Relation Age of Onset  . Prostate cancer Father        oldr when dx   . Diabetes Maternal Grandmother     Social History   Socioeconomic History  . Marital status: Married    Spouse name: Not on file  . Number of children: 3  . Years of education: Not on file  . Highest education level: Not on file  Occupational History  . 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   Social Needs  . Financial resource strain: Not on file  . Food insecurity:    Worry: Not on file    Inability: Not on file  . Transportation needs:    Medical: Not on file    Non-medical: Not on file  Tobacco Use  . Smoking status: Never Smoker  . Smokeless tobacco: Never Used  Substance and Sexual Activity  . Alcohol use: No  . Drug use: No  . Sexual activity: Yes    Partners: Female  Lifestyle  . Physical activity:    Days per week: Not on file    Minutes per session: Not on file  . Stress: Not on file  Relationships  . Social connections:    Talks on phone: Not on file    Gets together: Not on file    Attends religious service: Not on file    Active member of club or organization: Not on file    Attends meetings of clubs or  organizations: Not on file    Relationship status: Not on file  . Intimate partner violence:    Fear of current or ex partner: Not on file    Emotionally abused: Not on file    Physically abused: Not on file    Forced sexual activity: Not on file  Other Topics Concern  . Not on file  Social History Narrative   No regular exercise. 1 caffeine /soda per day.     Outpatient Medications Prior to Visit  Medication Sig Dispense Refill  . AMBULATORY NON FORMULARY MEDICATION Medication Name: Glucometer and strips and lancet to check once a day.  Dx 250.00 1 Units 11  . cholecalciferol (VITAMIN D3) 25 MCG (1000 UT) tablet Take 1,000 Units by mouth daily.    . clotrimazole (LOTRIMIN)  1 % cream Apply to affected area 2 times daily 15 g 0  . Insulin Pen Needle (PEN NEEDLES) 31G X 8 MM MISC Use for administraiton of Victoza daily. 100 each 2  . lisinopril (PRINIVIL,ZESTRIL) 10 MG tablet TAKE 1 TABLET(10 MG) BY MOUTH DAILY 90 tablet 0  . omeprazole (PRILOSEC) 20 MG capsule   11  . Saxagliptin-Metformin (KOMBIGLYZE XR) 06-998 MG TB24 Take 1 tablet by mouth daily. 60 tablet 0  . ketoconazole (NIZORAL) 2 % cream Apply 1 application topically 2 (two) times daily. X 3 weeks. 45 g 0  . atorvastatin (LIPITOR) 20 MG tablet Take 1 tablet (20 mg total) by mouth daily. (Patient not taking: Reported on 03/17/2018) 90 tablet 3   No facility-administered medications prior to visit.     Allergies  Allergen Reactions  . Niacin Other (See Comments)    unknown  . Onglyza [Saxagliptin] Nausea Only    ROS Review of Systems    Objective:    Physical Exam  Constitutional: He is oriented to person, place, and time. He appears well-developed and well-nourished.  HENT:  Head: Normocephalic and atraumatic.  Eyes: Conjunctivae and EOM are normal.  Cardiovascular: Normal rate.  Pulmonary/Chest: Effort normal.  Musculoskeletal:     Comments: Tender over the left low back area.  Nontender over the lumbar spine.   He does have a history of a degenerative disc and thoracic spine.  Normal range of motion of the lumbar spine.  Neurological: He is alert and oriented to person, place, and time.  Skin: Skin is dry. No pallor.  Psychiatric: He has a normal mood and affect. His behavior is normal.  Vitals reviewed.   BP (!) 159/93   Pulse 67   Ht 5\' 10"  (1.778 m)   Wt 245 lb (111.1 kg)   SpO2 100%   BMI 35.15 kg/m  Wt Readings from Last 3 Encounters:  03/17/18 245 lb (111.1 kg)  03/13/18 246 lb (111.6 kg)  07/01/17 217 lb (98.4 kg)     Health Maintenance Due  Topic Date Due  . FOOT EXAM  10/25/2017  . HEMOGLOBIN A1C  01/01/2018    There are no preventive care reminders to display for this patient.  No results found for: TSH Lab Results  Component Value Date   WBC 10.9 (H) 05/05/2015   HGB 16.5 05/05/2015   HCT 48.6 05/05/2015   MCV 81.4 05/05/2015   PLT 199 05/05/2015   Lab Results  Component Value Date   NA 140 03/13/2018   K 4.2 03/13/2018   CO2 26 03/13/2018   GLUCOSE 245 (H) 03/13/2018   BUN 10 03/13/2018   CREATININE 1.27 03/13/2018   BILITOT 0.7 03/13/2018   ALKPHOS 61 10/23/2016   AST 31 03/13/2018   ALT 53 (H) 03/13/2018   PROT 6.4 03/13/2018   ALBUMIN 4.2 07/25/2016   CALCIUM 9.1 03/13/2018   ANIONGAP 13 01/12/2014   Lab Results  Component Value Date   CHOL 160 07/25/2016   Lab Results  Component Value Date   HDL 33 (L) 07/25/2016   Lab Results  Component Value Date   LDLCALC 101 12/10/2014   Lab Results  Component Value Date   TRIG 135 07/25/2016   Lab Results  Component Value Date   CHOLHDL 4.8 07/25/2016   Lab Results  Component Value Date   HGBA1C 7.9 (A) 03/17/2018      Assessment & Plan:   Problem List Items Addressed This Visit    None  Visit Diagnoses    Uncontrolled type 2 diabetes mellitus without complication, without long-term current use of insulin (HCC)    -  Primary   Relevant Medications   Dulaglutide (TRULICITY) 6.96  EX/5.2WU SOPN   Other Relevant Orders   POCT glycosylated hemoglobin (Hb A1C) (Completed)   Cardiomegaly       Relevant Orders   ECHOCARDIOGRAM COMPLETE   Rash       Acute left-sided low back pain without sciatica       Non-recurrent bilateral inguinal hernia without obstruction or gangrene       Umbilical hernia without obstruction and without gangrene         Diabetes-A1c uncontrolled today.  It looked fantastic back in the spring but he came off of some of his medications and admits he really to started eating what ever he wanted to eat.  We discussed getting back on track and really working on this.  Also restarting the Trulicity and then following up in 3 months.  Cardiomegaly was noted on the CT renal study.  I think we should assess this further with an echocardiogram so we will schedule.  Rash-encouraged him to try the clotrimazole if the over-the-counter is that he is currently using or not helpful.  I will be happy to assess the rash in the future if he would like.  Left-sided low back pain I really think this is more musculoskeletal as we can press on the area and it is tender.  And he is already had a CT which ruled out any underlying problems with his bowel, spleen, kidney stone etc.  At this point I want him to work on gentle stretches as well as heat and/or ice.  If not improving we can work-up further with lumbar x-ray.  Bilateral inguinal and umbilical hernias-recommend he consider repeat surgical consultation.  Meds ordered this encounter  Medications  . ketoconazole (NIZORAL) 2 % cream    Sig: Apply 1 application topically 2 (two) times daily. X 3 weeks.    Dispense:  45 g    Refill:  0  . Dulaglutide (TRULICITY) 1.32 GM/0.1UU SOPN    Sig: Inject 0.75 mg into the skin every 7 (seven) days.    Dispense:  4 pen    Refill:  3    Follow-up: Return in about 3 months (around 06/15/2018) for Diabetes .    Beatrice Lecher, MD

## 2018-03-19 ENCOUNTER — Encounter: Payer: Self-pay | Admitting: Family Medicine

## 2018-03-27 ENCOUNTER — Other Ambulatory Visit: Payer: Self-pay | Admitting: Family Medicine

## 2018-04-01 ENCOUNTER — Ambulatory Visit (HOSPITAL_BASED_OUTPATIENT_CLINIC_OR_DEPARTMENT_OTHER)
Admission: RE | Admit: 2018-04-01 | Discharge: 2018-04-01 | Disposition: A | Discharge status: Home / Self Care | Payer: BLUE CROSS/BLUE SHIELD | Source: Ambulatory Visit | Attending: Family Medicine | Admitting: Family Medicine

## 2018-04-01 DIAGNOSIS — I517 Cardiomegaly: Secondary | ICD-10-CM | POA: Insufficient documentation

## 2018-04-01 NOTE — Progress Notes (Signed)
  Echocardiogram 2D Echocardiogram has been performed.  Nicholas Christian T Nicholas Christian 04/01/2018, 2:33 PM

## 2018-04-21 DIAGNOSIS — K5732 Diverticulitis of large intestine without perforation or abscess without bleeding: Secondary | ICD-10-CM | POA: Diagnosis not present

## 2018-04-21 DIAGNOSIS — E119 Type 2 diabetes mellitus without complications: Secondary | ICD-10-CM | POA: Diagnosis not present

## 2018-04-21 DIAGNOSIS — K8020 Calculus of gallbladder without cholecystitis without obstruction: Secondary | ICD-10-CM | POA: Diagnosis not present

## 2018-04-21 DIAGNOSIS — K5792 Diverticulitis of intestine, part unspecified, without perforation or abscess without bleeding: Secondary | ICD-10-CM | POA: Diagnosis not present

## 2018-04-22 ENCOUNTER — Telehealth: Payer: Self-pay

## 2018-04-22 ENCOUNTER — Emergency Department: Admission: EM | Admit: 2018-04-22 | Discharge: 2018-04-22 | Payer: 59 | Source: Home / Self Care

## 2018-04-22 ENCOUNTER — Other Ambulatory Visit: Payer: Self-pay

## 2018-04-22 ENCOUNTER — Encounter: Payer: Self-pay | Admitting: Emergency Medicine

## 2018-04-22 ENCOUNTER — Emergency Department (HOSPITAL_COMMUNITY)
Admission: EM | Admit: 2018-04-22 | Discharge: 2018-04-23 | Disposition: A | Discharge status: Home / Self Care | Payer: 59 | Attending: Emergency Medicine | Admitting: Emergency Medicine

## 2018-04-22 ENCOUNTER — Encounter (HOSPITAL_COMMUNITY): Payer: Self-pay | Admitting: *Deleted

## 2018-04-22 DIAGNOSIS — K8020 Calculus of gallbladder without cholecystitis without obstruction: Secondary | ICD-10-CM | POA: Insufficient documentation

## 2018-04-22 DIAGNOSIS — Z794 Long term (current) use of insulin: Secondary | ICD-10-CM | POA: Diagnosis not present

## 2018-04-22 DIAGNOSIS — K5792 Diverticulitis of intestine, part unspecified, without perforation or abscess without bleeding: Secondary | ICD-10-CM | POA: Diagnosis not present

## 2018-04-22 DIAGNOSIS — Z8719 Personal history of other diseases of the digestive system: Secondary | ICD-10-CM | POA: Insufficient documentation

## 2018-04-22 DIAGNOSIS — K5732 Diverticulitis of large intestine without perforation or abscess without bleeding: Secondary | ICD-10-CM | POA: Diagnosis not present

## 2018-04-22 DIAGNOSIS — E119 Type 2 diabetes mellitus without complications: Secondary | ICD-10-CM | POA: Diagnosis not present

## 2018-04-22 DIAGNOSIS — Z79899 Other long term (current) drug therapy: Secondary | ICD-10-CM | POA: Insufficient documentation

## 2018-04-22 DIAGNOSIS — R1032 Left lower quadrant pain: Secondary | ICD-10-CM | POA: Diagnosis present

## 2018-04-22 LAB — COMPREHENSIVE METABOLIC PANEL
ALT: 24 U/L (ref 0–44)
AST: 19 U/L (ref 15–41)
Albumin: 3.4 g/dL — ABNORMAL LOW (ref 3.5–5.0)
Alkaline Phosphatase: 50 U/L (ref 38–126)
Anion gap: 9 (ref 5–15)
BUN: 11 mg/dL (ref 6–20)
CALCIUM: 8.3 mg/dL — AB (ref 8.9–10.3)
CO2: 23 mmol/L (ref 22–32)
Chloride: 101 mmol/L (ref 98–111)
Creatinine, Ser: 1.52 mg/dL — ABNORMAL HIGH (ref 0.61–1.24)
GFR calc non Af Amer: 52 mL/min — ABNORMAL LOW (ref >60)
Glucose, Bld: 179 mg/dL — ABNORMAL HIGH (ref 70–99)
Potassium: 3.3 mmol/L — ABNORMAL LOW (ref 3.5–5.1)
Sodium: 133 mmol/L — ABNORMAL LOW (ref 135–145)
Total Bilirubin: 1.7 mg/dL — ABNORMAL HIGH (ref 0.3–1.2)
Total Protein: 6.3 g/dL — ABNORMAL LOW (ref 6.5–8.1)

## 2018-04-22 LAB — URINALYSIS, ROUTINE W REFLEX MICROSCOPIC
Bilirubin Urine: NEGATIVE
GLUCOSE, UA: 150 mg/dL — AB
Hgb urine dipstick: NEGATIVE
Ketones, ur: 20 mg/dL — AB
Nitrite: NEGATIVE
Protein, ur: 30 mg/dL — AB
Specific Gravity, Urine: 1.025 (ref 1.005–1.030)
pH: 6 (ref 5.0–8.0)

## 2018-04-22 LAB — CBC
HCT: 45.6 % (ref 39.0–52.0)
Hemoglobin: 14.6 g/dL (ref 13.0–17.0)
MCH: 26.8 pg (ref 26.0–34.0)
MCHC: 32 g/dL (ref 30.0–36.0)
MCV: 83.7 fL (ref 80.0–100.0)
NRBC: 0 % (ref 0.0–0.2)
PLATELETS: 167 10*3/uL (ref 150–400)
RBC: 5.45 MIL/uL (ref 4.22–5.81)
RDW: 13 % (ref 11.5–15.5)
WBC: 15 10*3/uL — ABNORMAL HIGH (ref 4.0–10.5)

## 2018-04-22 LAB — LACTIC ACID, PLASMA: Lactic Acid, Venous: 1.6 mmol/L (ref 0.5–1.9)

## 2018-04-22 LAB — LIPASE, BLOOD: Lipase: 40 U/L (ref 11–51)

## 2018-04-22 MED ORDER — SODIUM CHLORIDE 0.9 % IV BOLUS
1000.0000 mL | Freq: Once | INTRAVENOUS | Status: AC
  Administered 2018-04-22: 1000 mL via INTRAVENOUS

## 2018-04-22 MED ORDER — ONDANSETRON 4 MG PO TBDP
4.0000 mg | ORAL_TABLET | Freq: Once | ORAL | Status: AC
  Administered 2018-04-22: 4 mg via ORAL
  Filled 2018-04-22: qty 1

## 2018-04-22 MED ORDER — MORPHINE SULFATE (PF) 4 MG/ML IV SOLN
4.0000 mg | Freq: Once | INTRAVENOUS | Status: AC
  Administered 2018-04-22: 4 mg via INTRAVENOUS
  Filled 2018-04-22: qty 1

## 2018-04-22 MED ORDER — ONDANSETRON HCL 4 MG/2ML IJ SOLN
4.0000 mg | Freq: Once | INTRAMUSCULAR | Status: AC
  Administered 2018-04-22: 4 mg via INTRAVENOUS
  Filled 2018-04-22: qty 2

## 2018-04-22 MED ORDER — SODIUM CHLORIDE 0.9 % IV SOLN
10.00 | INTRAVENOUS | Status: DC
Start: ? — End: 2018-04-22

## 2018-04-22 MED ORDER — GENERIC EXTERNAL MEDICATION
10.00 | Status: DC
Start: ? — End: 2018-04-22

## 2018-04-22 MED ORDER — SODIUM CHLORIDE 0.9% FLUSH
3.0000 mL | Freq: Once | INTRAVENOUS | Status: AC
  Administered 2018-04-22: 3 mL via INTRAVENOUS

## 2018-04-22 MED ORDER — CIPROFLOXACIN IN D5W 400 MG/200ML IV SOLN
400.0000 mg | Freq: Once | INTRAVENOUS | Status: AC
  Administered 2018-04-22: 400 mg via INTRAVENOUS
  Filled 2018-04-22: qty 200

## 2018-04-22 MED ORDER — METRONIDAZOLE IN NACL 5-0.79 MG/ML-% IV SOLN
500.0000 mg | Freq: Once | INTRAVENOUS | Status: AC
  Administered 2018-04-22: 500 mg via INTRAVENOUS
  Filled 2018-04-22: qty 100

## 2018-04-22 MED ORDER — ACETAMINOPHEN 325 MG PO TABS
650.0000 mg | ORAL_TABLET | Freq: Once | ORAL | Status: AC
  Administered 2018-04-22: 650 mg via ORAL
  Filled 2018-04-22: qty 2

## 2018-04-22 NOTE — Telephone Encounter (Signed)
I agree, esp if still having high fever.

## 2018-04-22 NOTE — ED Triage Notes (Signed)
Went to ED last night, diverticulitis, gall stones, given meds. Fever today 103.1, LLQ pain

## 2018-04-22 NOTE — Telephone Encounter (Signed)
Nicholas Christian was seen in the ER and was diagnosed with Diverticulitis yesterday. Today he has had a fever of 102.0 and is very nauseated. He hasn't been able to drink much due to the nausea. I advised his wife to take him to the urgent care to be evaluated for the need of fluids.

## 2018-04-22 NOTE — ED Triage Notes (Signed)
Pt reports pain in his left lower abdomen for quite some time, pain became worse, associated with constipation/diarrhea/nausea and went to [facility] ED last night, diagnosed with diverticulitis. Given oral antibiotic. Came for further eval because he reports his fever has been elevated and pain is worse. Last took vicodin about 1530. Mask given in triage.

## 2018-04-22 NOTE — ED Provider Notes (Signed)
[place] EMERGENCY DEPARTMENT Provider Note   CSN: [id number] Arrival date & time: 04/22/18  1905    History   Chief Complaint Chief Complaint  Patient presents with  . Abdominal Pain    HPI Nicholas Christian is a 53 y.o. male.     The history is provided by the patient and medical records.     53 year old male with history of degenerative disc disease, diabetes, GERD, hyperlipidemia, hypertension, sleep apnea, migraine headaches, presenting to the ED with abdominal pain.  Patient seen in the ED last night at [facility] and diagnosed with diverticulitis by CT scan.  He was discharged home with Cipro and Flagyl, has only taken 1 dose of this so far.  States overall he has been feeling more poorly today-- fevers up to 103F.  He has had nausea but denies vomiting.  Seemingly tolerating oral medications ok.  He was given vicodin for pain but not much relief from.  No BM today.  No cough or URI symptoms.  States history of diverticulitis in the past about 15 years ago, treated as an OP that time.  Past Medical History:  Diagnosis Date  . Complication of anesthesia    low O2 sat after kidney surgery ~ 2009  . DDD (degenerative disc disease), cervical   . Diabetes mellitus without complication ([place])   . Diverticulitis   . Eosinophilic esophagitis    hx of- [facility]  . GERD (gastroesophageal reflux disease)   . High triglycerides   . History of hiatal hernia   . History of kidney stones    hx  . Low HDL (under 40)   . Low testosterone   . Migraines   . Normal cardiac stress test    DOBUTAMINE STRESS ECHO 10 years ago   . OSA (obstructive sleep apnea)    no cpap used  . Pneumonia 10/15   no problems now    Patient Active Problem List   Diagnosis Date Noted  . Non-recurrent bilateral inguinal hernia without obstruction or gangrene 03/17/2018  . Umbilical hernia without obstruction and without gangrene 03/17/2018  . History of Clostridium difficile colitis  05/09/2015  . LLQ abdominal pain 05/05/2015  . Herniated nucleus pulposus, thoracic 01/18/2014  . Eosinophilic esophagitis 76/22/6333  . Obesity 02/16/2011  . Controlled type 2 diabetes mellitus without complication, without long-term current use of insulin ([place]) 02/07/2011  . OSA (obstructive sleep apnea) 11/16/2009  . ESSENTIAL HYPERTENSION, BENIGN 11/16/2009  . BACK PAIN, THORACIC REGION 11/24/2007  . DIVERTICULOSIS OF COLON 06/25/2007  . Fatty liver disease, nonalcoholic 54/56/2563  . GAD (generalized anxiety disorder) 11/20/2005  . MIGRAINE, UNSPEC., W/O INTRACTABLE MIGRAINE 11/20/2005  . KIDNEY STONE - NEPHROLITHIASIS 11/20/2005    Past Surgical History:  Procedure Laterality Date  . EDG  01/03/12  . ESOPHAGEAL DILATION  07/18/11   Dr. Tora Duck  . KIDNEY STONE SURGERY     laser at [place] MICRODISCECTOMY Left 01/18/2014   Procedure: Left Thoracic five-six microdiskectomy;  Surgeon: Newman Pies, MD;  Location: [facility];  Service: Neurosurgery;  Laterality: Left;  . NASAL SINUS SURGERY     Removal polyps   . TONSILLECTOMY AND ADENOIDECTOMY    . TYMPANOSTOMY TUBE PLACEMENT          Home Medications    Prior to Admission medications   Medication Sig Start Date End Date Taking? Authorizing Provider  atorvastatin (LIPITOR) 20 MG tablet Take 1 tablet (20 mg total)  by mouth daily. 10/10/17  Yes Hali Marry, MD  cholecalciferol (VITAMIN D3) 25 MCG (1000 UT) tablet Take 1,000 Units by mouth daily.   Yes [provider]  ciprofloxacin (CIPRO) 500 MG tablet Take 500 mg by mouth 2 (two) times daily. FOR 10 DAYS 04/22/18 05/01/18 Yes [provider]  AMBULATORY NON FORMULARY MEDICATION Medication Name: Glucometer and strips and lancet to check once a day.  Dx 250.00 12/10/14   Hali Marry, MD  clotrimazole (LOTRIMIN) 1 % cream Apply to affected area 2 times daily 03/13/18   Noe Gens, PA-C    Dulaglutide (TRULICITY) 3.71 IR/6.7EL SOPN Inject 0.75 mg into the skin every 7 (seven) days. 03/17/18   Hali Marry, MD  HYDROcodone-acetaminophen (NORCO/VICODIN) 5-325 MG tablet Take 1 tablet by mouth every 8 (eight) hours as needed (for pain).     [provider]  Insulin Pen Needle (PEN NEEDLES) 31G X 8 MM MISC Use for administraiton of Victoza daily. 06/27/15   Hali Marry, MD  ketoconazole (NIZORAL) 2 % cream Apply 1 application topically 2 (two) times daily. X 3 weeks. 03/17/18   Hali Marry, MD  KOMBIGLYZE XR 06-998 MG TB24 TAKE 1 TABLET BY MOUTH DAILY Patient taking differently: Take 1 tablet by mouth daily.  03/27/18   Hali Marry, MD  lisinopril (PRINIVIL,ZESTRIL) 10 MG tablet TAKE 1 TABLET(10 MG) BY MOUTH DAILY Patient taking differently: Take 10 mg by mouth daily.  12/30/17   Hali Marry, MD  metroNIDAZOLE (FLAGYL) 250 MG tablet Take 250 mg by mouth 3 (three) times daily.    [provider]  omeprazole (PRILOSEC) 20 MG capsule Take 20 mg by mouth 2 (two) times daily.  10/23/16   [provider]    Family History Family History  Problem Relation Age of Onset  . Prostate cancer Father        oldr when dx   . Diabetes Maternal Grandmother     Social History Social History   Tobacco Use  . Smoking status: Never Smoker  . Smokeless tobacco: Never Used  Substance Use Topics  . Alcohol use: No  . Drug use: No     Allergies   Niacin and Onglyza [saxagliptin]   Review of Systems Review of Systems  Gastrointestinal: Positive for abdominal pain and nausea.  All other systems reviewed and are negative.    Physical Exam Updated Vital Signs BP 124/89   Pulse (!) 113   Temp 99.7 F (37.6 C) (Oral)   Resp 16   Ht 5\' 11"  (1.803 m)   Wt 106.1 kg   SpO2 97%   BMI 32.64 kg/m   Physical Exam Vitals signs and nursing note reviewed.  Constitutional:      Appearance: He is well-developed.  HENT:      Head: Normocephalic and atraumatic.  Eyes:     Conjunctiva/sclera: Conjunctivae normal.     Pupils: Pupils are equal, round, and reactive to light.  Neck:     Musculoskeletal: Normal range of motion.  Cardiovascular:     Rate and Rhythm: Normal rate and regular rhythm.     Heart sounds: Normal heart sounds.  Pulmonary:     Effort: Pulmonary effort is normal.     Breath sounds: Normal breath sounds.  Abdominal:     General: Bowel sounds are normal.     Palpations: Abdomen is soft.     Tenderness: There is abdominal tenderness in the left lower quadrant.  Musculoskeletal: Normal range of motion.  Skin:    General: Skin is warm and dry.  Neurological:     Mental Status: He is alert and oriented to person, place, and time.      ED Treatments / Results  Labs (all labs ordered are listed, but only abnormal results are displayed) Labs Reviewed  COMPREHENSIVE METABOLIC PANEL - Abnormal; Notable for the following components:      Result Value   Sodium 133 (*)    Potassium 3.3 (*)    Glucose, Bld 179 (*)    Creatinine, Ser 1.52 (*)    Calcium 8.3 (*)    Total Protein 6.3 (*)    Albumin 3.4 (*)    Total Bilirubin 1.7 (*)    GFR calc non Af Amer 52 (*)    All other components within normal limits  CBC - Abnormal; Notable for the following components:   WBC 15.0 (*)    All other components within normal limits  URINALYSIS, ROUTINE W REFLEX MICROSCOPIC - Abnormal; Notable for the following components:   Color, Urine AMBER (*)    Glucose, UA 150 (*)    Ketones, ur 20 (*)    Protein, ur 30 (*)    Leukocytes,Ua TRACE (*)    Bacteria, UA RARE (*)    All other components within normal limits  LIPASE, BLOOD  LACTIC ACID, PLASMA  LACTIC ACID, PLASMA    EKG None  Radiology No results found.  Procedures Procedures (including critical care time)  Medications Ordered in ED Medications  sodium chloride flush (NS) 0.9 % injection 3 mL (3 mLs Intravenous Given 04/22/18  2317)  acetaminophen (TYLENOL) tablet 650 mg (650 mg Oral Given 04/22/18 1929)  ondansetron (ZOFRAN-ODT) disintegrating tablet 4 mg (4 mg Oral Given 04/22/18 1929)  sodium chloride 0.9 % bolus 1,000 mL (0 mLs Intravenous Stopped 04/23/18 0031)  ondansetron (ZOFRAN) injection 4 mg (4 mg Intravenous Given 04/22/18 2317)  morphine 4 MG/ML injection 4 mg (4 mg Intravenous Given 04/22/18 2317)  ciprofloxacin (CIPRO) IVPB 400 mg (0 mg Intravenous Stopped 04/23/18 0016)  metroNIDAZOLE (FLAGYL) IVPB 500 mg (0 mg Intravenous Stopped 04/23/18 0016)     Initial Impression / Assessment and Plan / ED Course  I have reviewed the triage vital signs and the nursing notes.  Pertinent labs & imaging results that were available during my care of the patient were reviewed by me and considered in my medical decision making (see chart for details).  53 year old male here with diverticulitis, diagnosed yesterday at outside hospital by CT scan.  Has only taken 1 dose of his Cipro and Flagyl thus far.  He was concerned today due to fevers up to 103F.  He is afebrile by time of my evaluation and nontoxic in appearance.  He does have some tenderness in the left lower abdomen but no peritoneal signs.  Labs are reviewed, leukocytosis noted that is slightly higher from yesterday.  Creatinine slightly above normal as well.  He does report decreased oral intake today.  Discussed options of inpatient versus outpatient management.  Patient would truly like to continue treatment at home.  We will give some IV fluids here, plan for symptomatic control.  He is also given round of IV antibiotics.  Will reassess.  Patient reports he is feeling better after IV fluids and IV antibiotics.  He has not had any emesis here, tolerating oral fluids well at this time.  He remains afebrile and nontoxic.  Patient desires discharge-- feel this  is reasonable.  Discussed that he may continue to spike fevers given known source of infection, recommended  Motrin.  He was not prescribed any nausea medication at ED visit yesterday, given prescription for Zofran.  I have also changed his pain medication as he did not have much relief with the hydrocodone.  He will schedule follow-up with his primary care doctor.  He can return here for any new or acute changes.  Final Clinical Impressions(s) / ED Diagnoses   Final diagnoses:  Diverticulitis    ED Discharge Orders         Ordered    oxyCODONE-acetaminophen (PERCOCET) 5-325 MG tablet  Every 4 hours PRN     04/23/18 0105    ondansetron (ZOFRAN ODT) 4 MG disintegrating tablet  Every 8 hours PRN     04/23/18 0105           Larene Pickett, PA-C 04/23/18 0134    Davonna Belling, MD 04/23/18 2324

## 2018-04-23 MED ORDER — OXYCODONE-ACETAMINOPHEN 5-325 MG PO TABS: 1.0000 | ORAL_TABLET | ORAL | 0 refills | Status: DC | PRN

## 2018-04-23 MED ORDER — ONDANSETRON 4 MG PO TBDP: 4.0000 mg | ORAL_TABLET | Freq: Three times a day (TID) | ORAL | 0 refills | Status: DC | PRN

## 2018-04-23 NOTE — ED Notes (Signed)
Pt discharged from ED; instructions provided and scripts given; Pt encouraged to return to ED if symptoms worsen and to f/u with PCP; Pt verbalized understanding of all instructions 

## 2018-04-23 NOTE — Discharge Instructions (Signed)
Take the prescribed medication as directed.  Stop the hydrocodone.  Can add motrin in for fever if needed-- up to 800mg  3x daily. Follow-up with your primary care doctor. Return to the ED for new or worsening symptoms.

## 2018-04-25 ENCOUNTER — Other Ambulatory Visit: Payer: Self-pay | Admitting: Family Medicine

## 2018-04-25 DIAGNOSIS — I10 Essential (primary) hypertension: Secondary | ICD-10-CM

## 2018-04-25 DIAGNOSIS — E1165 Type 2 diabetes mellitus with hyperglycemia: Principal | ICD-10-CM

## 2018-04-25 DIAGNOSIS — IMO0001 Reserved for inherently not codable concepts without codable children: Secondary | ICD-10-CM

## 2018-04-30 ENCOUNTER — Encounter: Payer: Self-pay | Admitting: Family Medicine

## 2018-04-30 ENCOUNTER — Ambulatory Visit: Payer: 59 | Admitting: Family Medicine

## 2018-04-30 ENCOUNTER — Other Ambulatory Visit: Payer: Self-pay

## 2018-04-30 VITALS — BP 124/89 | HR 67 | Temp 97.6°F | Ht 70.0 in | Wt 234.0 lb

## 2018-04-30 DIAGNOSIS — R05 Cough: Secondary | ICD-10-CM

## 2018-04-30 DIAGNOSIS — K5792 Diverticulitis of intestine, part unspecified, without perforation or abscess without bleeding: Secondary | ICD-10-CM

## 2018-04-30 DIAGNOSIS — R310 Gross hematuria: Secondary | ICD-10-CM | POA: Diagnosis not present

## 2018-04-30 DIAGNOSIS — R058 Other specified cough: Secondary | ICD-10-CM

## 2018-04-30 LAB — CBC WITH DIFFERENTIAL/PLATELET
Absolute Monocytes: 573 cells/uL (ref 200–950)
Basophils Absolute: 108 cells/uL (ref 0–200)
Basophils Relative: 1.3 %
Eosinophils Absolute: 141 cells/uL (ref 15–500)
Eosinophils Relative: 1.7 %
HCT: 46.2 % (ref 38.5–50.0)
Hemoglobin: 15.3 g/dL (ref 13.2–17.1)
Lymphs Abs: 2216 cells/uL (ref 850–3900)
MCH: 26.9 pg — ABNORMAL LOW (ref 27.0–33.0)
MCHC: 33.1 g/dL (ref 32.0–36.0)
MCV: 81.2 fL (ref 80.0–100.0)
MONOS PCT: 6.9 %
MPV: 10.8 fL (ref 7.5–12.5)
Neutro Abs: 5262 cells/uL (ref 1500–7800)
Neutrophils Relative %: 63.4 %
Platelets: 326 10*3/uL (ref 140–400)
RBC: 5.69 10*6/uL (ref 4.20–5.80)
RDW: 13.1 % (ref 11.0–15.0)
TOTAL LYMPHOCYTE: 26.7 %
WBC: 8.3 10*3/uL (ref 3.8–10.8)

## 2018-04-30 LAB — POCT URINALYSIS DIPSTICK
Bilirubin, UA: NEGATIVE
Blood, UA: NEGATIVE
Glucose, UA: NEGATIVE
Ketones, UA: NEGATIVE
Nitrite, UA: NEGATIVE
Protein, UA: NEGATIVE
Spec Grav, UA: >=1.03 — AB (ref 1.010–1.025)
UROBILINOGEN UA: 0.2 U/dL
pH, UA: 6.5 (ref 5.0–8.0)

## 2018-04-30 NOTE — Progress Notes (Signed)
Established Patient Office Visit  Subjective:  Patient ID: Nicholas Christian, male    DOB: 1965-03-26  Age: 53 y.o. MRN: [id number]  CC:  Chief Complaint  Patient presents with  . Hospitalization Follow-up    HPI Nicholas Christian presents for ED follow up.  He was seen in the ED  at [facility] on 3/9 and diagnosed with diverticulitis by CT scan.  He was discharged home with Cipro and Flagyl. He went back to the ED on 04/22/2018.  He is still on antibiotic until tomorrow.  He had fever as high as 103 at home.  FEver has resolved.  CBC showed a white count of 15,000 on March 10. He reports he was "peeing blood" while he was acutely ill.   No diarrhea or vomiting.  + Fatigue, + nausea.    He had been on a cruise in [place] right before this. Though he does report a dry cough.  He denies any chest pain or shortness of breath sore throat or other upper respiratory symptoms.  Past Medical History:  Diagnosis Date  . Complication of anesthesia    low O2 sat after kidney surgery ~ 2009  . DDD (degenerative disc disease), cervical   . Diabetes mellitus without complication ([place])   . Diverticulitis   . Eosinophilic esophagitis    hx of- [facility]  . GERD (gastroesophageal reflux disease)   . High triglycerides   . History of hiatal hernia   . History of kidney stones    hx  . Low HDL (under 40)   . Low testosterone   . Migraines   . Normal cardiac stress test    DOBUTAMINE STRESS ECHO 10 years ago   . OSA (obstructive sleep apnea)    no cpap used  . Pneumonia 10/15   no problems now    Past Surgical History:  Procedure Laterality Date  . EDG  01/03/12  . ESOPHAGEAL DILATION  07/18/11   Dr. Tora Duck  . KIDNEY STONE SURGERY     laser at [place] MICRODISCECTOMY Left 01/18/2014   Procedure: Left Thoracic five-six microdiskectomy;  Surgeon: Newman Pies, MD;  Location: [facility];  Service: Neurosurgery;  Laterality: Left;  . NASAL SINUS  SURGERY     Removal polyps   . TONSILLECTOMY AND ADENOIDECTOMY    . TYMPANOSTOMY TUBE PLACEMENT      Family History  Problem Relation Age of Onset  . Prostate cancer Father        oldr when dx   . Diabetes Maternal Grandmother     Social History   Socioeconomic History  . Marital status: Married    Spouse name: Not on file  . Number of children: 3  . Years of education: Not on file  . Highest education level: Not on file  Occupational History  . 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   Social Needs  . Financial resource strain: Not on file  . Food insecurity:    Worry: Not on file    Inability: Not on file  . Transportation needs:    Medical: Not on file    Non-medical: Not on file  Tobacco Use  . Smoking status: Never Smoker  . Smokeless tobacco: Never Used  Substance and Sexual Activity  . Alcohol use: No  . Drug use: No  . Sexual activity: Yes    Partners: Female  Lifestyle  . Physical activity:  Days per week: Not on file    Minutes per session: Not on file  . Stress: Not on file  Relationships  . Social connections:    Talks on phone: Not on file    Gets together: Not on file    Attends religious service: Not on file    Active member of club or organization: Not on file    Attends meetings of clubs or organizations: Not on file    Relationship status: Not on file  . Intimate partner violence:    Fear of current or ex partner: Not on file    Emotionally abused: Not on file    Physically abused: Not on file    Forced sexual activity: Not on file  Other Topics Concern  . Not on file  Social History Narrative   No regular exercise. 1 caffeine /soda per day.     Outpatient Medications Prior to Visit  Medication Sig Dispense Refill  . AMBULATORY NON FORMULARY MEDICATION Medication Name: Glucometer and strips and lancet to check once a day.  Dx 250.00 1 Units 11  . atorvastatin (LIPITOR) 20 MG tablet Take 1 tablet (20 mg  total) by mouth daily. 90 tablet 3  . cholecalciferol (VITAMIN D3) 25 MCG (1000 UT) tablet Take 1,000 Units by mouth daily.    . ciprofloxacin (CIPRO) 500 MG tablet Take 500 mg by mouth 2 (two) times daily. FOR 10 DAYS    . clotrimazole (LOTRIMIN) 1 % cream Apply to affected area 2 times daily (Patient taking differently: Apply 1 application topically 2 (two) times daily. ) 15 g 0  . Dulaglutide (TRULICITY) 1.61 WR/6.0AV SOPN Inject 0.75 mg into the skin every 7 (seven) days. (Patient taking differently: Inject 0.75 mg into the skin every Tuesday. ) 4 pen 3  . Insulin Pen Needle (PEN NEEDLES) 31G X 8 MM MISC Use for administraiton of Victoza daily. 100 each 2  . ketoconazole (NIZORAL) 2 % cream Apply 1 application topically 2 (two) times daily. X 3 weeks. (Patient taking differently: Apply 1 application topically 2 (two) times daily as needed for irritation. ) 45 g 0  . KOMBIGLYZE XR 06-998 MG TB24 TAKE 1 TABLET BY MOUTH DAILY (Patient taking differently: Take 1 tablet by mouth daily. ) 90 tablet 1  . lisinopril (PRINIVIL,ZESTRIL) 10 MG tablet Take 1 tablet (10 mg total) by mouth daily. 90 tablet 1  . metroNIDAZOLE (FLAGYL) 250 MG tablet Take 250 mg by mouth 3 (three) times daily.    Marland Kitchen omeprazole (PRILOSEC) 20 MG capsule Take 20 mg by mouth 2 (two) times daily.   11  . ondansetron (ZOFRAN ODT) 4 MG disintegrating tablet Take 1 tablet (4 mg total) by mouth every 8 (eight) hours as needed for nausea. 10 tablet 0  . HYDROcodone-acetaminophen (NORCO/VICODIN) 5-325 MG tablet Take 1 tablet by mouth every 8 (eight) hours as needed (for pain).     Marland Kitchen oxyCODONE-acetaminophen (PERCOCET) 5-325 MG tablet Take 1 tablet by mouth every 4 (four) hours as needed. 20 tablet 0   No facility-administered medications prior to visit.     Allergies  Allergen Reactions  . Niacin Other (See Comments)    Reaction not recalled  . Onglyza [Saxagliptin] Nausea Only    ROS Review of Systems    Objective:    Physical  Exam  Constitutional: He is oriented to person, place, and time. He appears well-developed and well-nourished.  HENT:  Head: Normocephalic and atraumatic.  Right Ear: External ear normal.  Left Ear: External ear normal.  Nose: Nose normal.  Mouth/Throat: Oropharynx is clear and moist.  TMs and canals are clear.   Eyes: Pupils are equal, round, and reactive to light. Conjunctivae and EOM are normal.  Neck: Neck supple. No thyromegaly present.  Cardiovascular: Normal rate and normal heart sounds.  Pulmonary/Chest: Effort normal and breath sounds normal.  Abdominal: Soft. Bowel sounds are normal. He exhibits no distension and no mass. There is no abdominal tenderness. There is no rebound and no guarding.  Lymphadenopathy:    He has no cervical adenopathy.  Neurological: He is alert and oriented to person, place, and time.  Skin: Skin is warm and dry.  Psychiatric: He has a normal mood and affect.    BP 124/89   Pulse 67   Temp 97.6 F (36.4 C)   Ht 5\' 10"  (1.778 m)   Wt 234 lb (106.1 kg)   SpO2 100%   BMI 33.58 kg/m  Wt Readings from Last 3 Encounters:  04/30/18 234 lb (106.1 kg)  04/22/18 234 lb (106.1 kg)  03/17/18 245 lb (111.1 kg)     Health Maintenance Due  Topic Date Due  . FOOT EXAM  10/25/2017    There are no preventive care reminders to display for this patient.  No results found for: TSH Lab Results  Component Value Date   WBC 15.0 (H) 04/22/2018   HGB 14.6 04/22/2018   HCT 45.6 04/22/2018   MCV 83.7 04/22/2018   PLT 167 04/22/2018   Lab Results  Component Value Date   NA 133 (L) 04/22/2018   K 3.3 (L) 04/22/2018   CO2 23 04/22/2018   GLUCOSE 179 (H) 04/22/2018   BUN 11 04/22/2018   CREATININE 1.52 (H) 04/22/2018   BILITOT 1.7 (H) 04/22/2018   ALKPHOS 50 04/22/2018   AST 19 04/22/2018   ALT 24 04/22/2018   PROT 6.3 (L) 04/22/2018   ALBUMIN 3.4 (L) 04/22/2018   CALCIUM 8.3 (L) 04/22/2018   ANIONGAP 9 04/22/2018   Lab Results  Component  Value Date   CHOL 160 07/25/2016   Lab Results  Component Value Date   HDL 33 (L) 07/25/2016   Lab Results  Component Value Date   LDLCALC 101 12/10/2014   Lab Results  Component Value Date   TRIG 135 07/25/2016   Lab Results  Component Value Date   CHOLHDL 4.8 07/25/2016   Lab Results  Component Value Date   HGBA1C 7.9 (A) 03/17/2018      Assessment & Plan:   Problem List Items Addressed This Visit      Other   Diverticulitis - Primary   Relevant Orders   CBC with Differential/Platelet    Other Visit Diagnoses    Gross hematuria       Relevant Orders   CBC with Differential/Platelet   POCT urinalysis dipstick (Completed)   Dry cough         Diverticulitis-he is significantly better.  Nontender on exam today.  Make sure to complete antibiotics.  May want to consider starting a natural culture ureter to try to help rebalance his gut flora.  Call if symptoms recur or get worse.  Gross hematuria-it seems like it has resolved this week but I would like to check a urinalysis.  Dry cough-lungs are clear on exam and he is afebrile.  Should improve over the next week and if not then please let us know.  No orders of the defined types were placed in this encounter.  Follow-up: Return if symptoms worsen or fail to improve.    Beatrice Lecher, MD

## 2018-04-30 NOTE — Patient Instructions (Signed)
If cough does not improve over the next week then please let me know.

## 2018-06-16 ENCOUNTER — Ambulatory Visit (INDEPENDENT_AMBULATORY_CARE_PROVIDER_SITE_OTHER): Payer: 59 | Admitting: Family Medicine

## 2018-06-16 ENCOUNTER — Encounter: Payer: Self-pay | Admitting: Family Medicine

## 2018-06-16 VITALS — BP 119/80 | HR 75 | Temp 98.4°F | Ht 70.0 in | Wt 236.0 lb

## 2018-06-16 DIAGNOSIS — E119 Type 2 diabetes mellitus without complications: Secondary | ICD-10-CM

## 2018-06-16 DIAGNOSIS — Z125 Encounter for screening for malignant neoplasm of prostate: Secondary | ICD-10-CM

## 2018-06-16 DIAGNOSIS — Z6833 Body mass index (BMI) 33.0-33.9, adult: Secondary | ICD-10-CM

## 2018-06-16 DIAGNOSIS — R109 Unspecified abdominal pain: Secondary | ICD-10-CM

## 2018-06-16 DIAGNOSIS — I10 Essential (primary) hypertension: Secondary | ICD-10-CM | POA: Diagnosis not present

## 2018-06-16 LAB — POCT URINALYSIS DIPSTICK
Bilirubin, UA: NEGATIVE
Blood, UA: NEGATIVE
Glucose, UA: NEGATIVE
Ketones, UA: NEGATIVE
Leukocytes, UA: NEGATIVE
Nitrite, UA: NEGATIVE
Protein, UA: NEGATIVE
Spec Grav, UA: >=1.03 — AB (ref 1.010–1.025)
Urobilinogen, UA: 0.2 E.U./dL
pH, UA: 5.5 (ref 5.0–8.0)

## 2018-06-16 LAB — POCT GLYCOSYLATED HEMOGLOBIN (HGB A1C): Hemoglobin A1C: 7.5 % — AB (ref 4.0–5.6)

## 2018-06-16 LAB — POCT UA - MICROALBUMIN
Albumin/Creatinine Ratio, Urine, POC: <30
Creatinine, POC: 300 mg/dL
Microalbumin Ur, POC: 30 mg/L

## 2018-06-16 MED ORDER — DULAGLUTIDE 1.5 MG/0.5ML ~~LOC~~ SOAJ: 1.5000 mg | SUBCUTANEOUS | 3 refills | Status: DC

## 2018-06-16 NOTE — Assessment & Plan Note (Addendum)
Reminded him to schedule his eye exam.  A1c today is improved down to 7.5.  Discussed that the goal is to get it under 7.  He is willing to try the increased dose of Trulicity at 1.5 mg weekly so we will send over new prescription.  He had actually tried it about 3 or 4 years ago but cannot remember why he went back down to the 0.75 mg we also discussed cutting out sweets as he has been stress eating and getting back on track.  Love to see him lose about 10 pounds. Lab Results  Component Value Date   HGBA1C 7.9 (A) 03/17/2018

## 2018-06-16 NOTE — Progress Notes (Signed)
Established Patient Office Visit  Subjective:  Patient ID: Nicholas Christian, male    DOB: 15-Aug-1965  Age: 53 y.o. MRN: [id number]  CC:  Chief Complaint  Patient presents with  . Diabetes    HPI Nicholas Christian presents for  Diabetes - no hypoglycemic events. No wounds or sores that are not healing well. No increased thirst or urination. Checking glucose at home. Taking medications as prescribed without any side effects.  He admits he has been stressed eating.  But he is doing well with the Trulicity.  Hypertension- Pt denies chest pain, SOB, dizziness, or heart palpitations.  Taking meds as directed w/o problems.  Denies medication side effects.    He is also still been having some left-sided pain from when he had diverticulitis.  He says just between the hip and the rib area.  He wonders if it could actually be his kidney as he noticed that it feels worse if he holds his urine for example at night if he feels like he has to urinate but does not get up and do it he will feel more pressure there.  No fevers chills or sweats.  Pain has not been debilitating.   Past Medical History:  Diagnosis Date  . Complication of anesthesia    low O2 sat after kidney surgery ~ 2009  . DDD (degenerative disc disease), cervical   . Diabetes mellitus without complication ([place])   . Diverticulitis   . Eosinophilic esophagitis    hx of- [facility]  . GERD (gastroesophageal reflux disease)   . High triglycerides   . History of hiatal hernia   . History of kidney stones    hx  . Low HDL (under 40)   . Low testosterone   . Migraines   . Normal cardiac stress test    DOBUTAMINE STRESS ECHO 10 years ago   . OSA (obstructive sleep apnea)    no cpap used  . Pneumonia 10/15   no problems now    Past Surgical History:  Procedure Laterality Date  . EDG  01/03/12  . ESOPHAGEAL DILATION  07/18/11   Dr. Tora Duck  . KIDNEY STONE SURGERY     laser at [facility] MICRODISCECTOMY Left 01/18/2014   Procedure: Left Thoracic five-six microdiskectomy;  Surgeon: Newman Pies, MD;  Location: [facility];  Service: Neurosurgery;  Laterality: Left;  . NASAL SINUS SURGERY     Removal polyps   . TONSILLECTOMY AND ADENOIDECTOMY    . TYMPANOSTOMY TUBE PLACEMENT      Family History  Problem Relation Age of Onset  . Prostate cancer Father        oldr when dx   . Diabetes Maternal Grandmother     Social History   Socioeconomic History  . Marital status: Married    Spouse name: Not on file  . Number of children: 3  . Years of education: Not on file  . Highest education level: Not on file  Occupational History  . 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   Social Needs  . Financial resource strain: Not on file  . Food insecurity:    Worry: Not on file    Inability: Not on file  . Transportation needs:    Medical: Not on file    Non-medical: Not on file  Tobacco Use  . Smoking status: Never Smoker  . Smokeless tobacco: Never Used  Substance and Sexual Activity  .  Alcohol use: No  . Drug use: No  . Sexual activity: Yes    Partners: Female  Lifestyle  . Physical activity:    Days per week: Not on file    Minutes per session: Not on file  . Stress: Not on file  Relationships  . Social connections:    Talks on phone: Not on file    Gets together: Not on file    Attends religious service: Not on file    Active member of club or organization: Not on file    Attends meetings of clubs or organizations: Not on file    Relationship status: Not on file  . Intimate partner violence:    Fear of current or ex partner: Not on file    Emotionally abused: Not on file    Physically abused: Not on file    Forced sexual activity: Not on file  Other Topics Concern  . Not on file  Social History Narrative   No regular exercise. 1 caffeine /soda per day.     Outpatient Medications Prior to Visit   Medication Sig Dispense Refill  . AMBULATORY NON FORMULARY MEDICATION Medication Name: Glucometer and strips and lancet to check once a day.  Dx 250.00 1 Units 11  . atorvastatin (LIPITOR) 20 MG tablet Take 1 tablet (20 mg total) by mouth daily. 90 tablet 3  . cholecalciferol (VITAMIN D3) 25 MCG (1000 UT) tablet Take 1,000 Units by mouth daily.    . clotrimazole (LOTRIMIN) 1 % cream Apply to affected area 2 times daily (Patient taking differently: Apply 1 application topically 2 (two) times daily. ) 15 g 0  . Insulin Pen Needle (PEN NEEDLES) 31G X 8 MM MISC Use for administraiton of Victoza daily. 100 each 2  . ketoconazole (NIZORAL) 2 % cream Apply 1 application topically 2 (two) times daily. X 3 weeks. (Patient taking differently: Apply 1 application topically 2 (two) times daily as needed for irritation. ) 45 g 0  . KOMBIGLYZE XR 06-998 MG TB24 TAKE 1 TABLET BY MOUTH DAILY (Patient taking differently: Take 1 tablet by mouth daily. ) 90 tablet 1  . lisinopril (PRINIVIL,ZESTRIL) 10 MG tablet Take 1 tablet (10 mg total) by mouth daily. 90 tablet 1  . omeprazole (PRILOSEC) 20 MG capsule Take 20 mg by mouth 2 (two) times daily.   11  . Dulaglutide (TRULICITY) 7.42 VZ/5.6LO SOPN Inject 0.75 mg into the skin every 7 (seven) days. (Patient taking differently: Inject 0.75 mg into the skin every Tuesday. ) 4 pen 3  . metroNIDAZOLE (FLAGYL) 250 MG tablet Take 250 mg by mouth 3 (three) times daily.    . ondansetron (ZOFRAN ODT) 4 MG disintegrating tablet Take 1 tablet (4 mg total) by mouth every 8 (eight) hours as needed for nausea. 10 tablet 0   No facility-administered medications prior to visit.     Allergies  Allergen Reactions  . Niacin Other (See Comments)    Reaction not recalled  . Onglyza [Saxagliptin] Nausea Only    ROS Review of Systems    Objective:    Physical Exam  Constitutional: He is oriented to person, place, and time. He appears well-developed and well-nourished.  HENT:   Head: Normocephalic and atraumatic.  Cardiovascular: Normal rate, regular rhythm and normal heart sounds.  Pulmonary/Chest: Effort normal and breath sounds normal.  Musculoskeletal:     Comments: Nontender over the lumbar spine.  Normal rotation of the lumbar spine.  It is just a little bit tender  between the ribs and the hip bone on the left lateral side.  Neurological: He is alert and oriented to person, place, and time.  Skin: Skin is warm and dry.  Psychiatric: He has a normal mood and affect. His behavior is normal.    BP 119/80   Pulse 75   Temp 98.4 F (36.9 C)   Ht 5\' 10"  (1.778 m)   Wt 236 lb (107 kg)   SpO2 98%   BMI 33.86 kg/m  Wt Readings from Last 3 Encounters:  06/16/18 236 lb (107 kg)  04/30/18 234 lb (106.1 kg)  04/22/18 234 lb (106.1 kg)     Health Maintenance Due  Topic Date Due  . OPHTHALMOLOGY EXAM  05/15/2018    There are no preventive care reminders to display for this patient.  No results found for: TSH Lab Results  Component Value Date   WBC 8.3 04/30/2018   HGB 15.3 04/30/2018   HCT 46.2 04/30/2018   MCV 81.2 04/30/2018   PLT 326 04/30/2018   Lab Results  Component Value Date   NA 133 (L) 04/22/2018   K 3.3 (L) 04/22/2018   CO2 23 04/22/2018   GLUCOSE 179 (H) 04/22/2018   BUN 11 04/22/2018   CREATININE 1.52 (H) 04/22/2018   BILITOT 1.7 (H) 04/22/2018   ALKPHOS 50 04/22/2018   AST 19 04/22/2018   ALT 24 04/22/2018   PROT 6.3 (L) 04/22/2018   ALBUMIN 3.4 (L) 04/22/2018   CALCIUM 8.3 (L) 04/22/2018   ANIONGAP 9 04/22/2018   Lab Results  Component Value Date   CHOL 160 07/25/2016   Lab Results  Component Value Date   HDL 33 (L) 07/25/2016   Lab Results  Component Value Date   LDLCALC 101 12/10/2014   Lab Results  Component Value Date   TRIG 135 07/25/2016   Lab Results  Component Value Date   CHOLHDL 4.8 07/25/2016   Lab Results  Component Value Date   HGBA1C 7.5 (A) 06/16/2018      Assessment & Plan:    Problem List Items Addressed This Visit      Cardiovascular and Mediastinum   ESSENTIAL HYPERTENSION, BENIGN - Primary    Well controlled. Continue current regimen. Follow up in  3-4 months.       Relevant Orders   COMPLETE METABOLIC PANEL WITH GFR   PSA   Lipid panel   CBC     Endocrine   Controlled type 2 diabetes mellitus without complication, without long-term current use of insulin ([place])    Reminded him to schedule his eye exam.  A1c today is improved down to 7.5.  Discussed that the goal is to get it under 7.  He is willing to try the increased dose of Trulicity at 1.5 mg weekly so we will send over new prescription.  He had actually tried it about 3 or 4 years ago but cannot remember why he went back down to the 0.75 mg we also discussed cutting out sweets as he has been stress eating and getting back on track.  Love to see him lose about 10 pounds. Lab Results  Component Value Date   HGBA1C 7.9 (A) 03/17/2018         Relevant Medications   Dulaglutide (TRULICITY) 1.5 VO/1.6WV SOPN   Other Relevant Orders   COMPLETE METABOLIC PANEL WITH GFR   PSA   Lipid panel   POCT glycosylated hemoglobin (Hb A1C) (Completed)   CBC   POCT urinalysis dipstick (Completed)  POCT UA - Microalbumin (Completed)    Other Visit Diagnoses    Screening for prostate cancer       Relevant Orders   PSA   Left flank pain       BMI 33.0-33.9,adult         Feeling well recheck his labs today.  Last labs were off because of diverticulitis.  BMI 33-encouraged him to continue to work on healthy choices and regular exercise and weight loss.  Left flank pain it is really between the ribs and the hip bones are really do not think this is related to his kidney is not quite in the right location.  Suspect that it could actually be coming more from his back.  Though he did not have any pain with rotation of the lumbar spine today.  He says it does feel tender at times.  We will recheck labs today.   He is afebrile which is reassuring.  We will also check for proteinuria as well as urinalysis today  Meds ordered this encounter  Medications  . Dulaglutide (TRULICITY) 1.5 VH/8.4ON SOPN    Sig: Inject 1.5 mg into the skin every 7 (seven) days.    Dispense:  4 pen    Refill:  3    Follow-up: Return in about 3 months (around 09/16/2018) for Diabetes .    Beatrice Lecher, MD

## 2018-06-16 NOTE — Assessment & Plan Note (Signed)
Well controlled. Continue current regimen. Follow up in  3-4 months.  

## 2018-06-20 LAB — COMPLETE METABOLIC PANEL WITH GFR
AG Ratio: 1.8 (calc) (ref 1.0–2.5)
ALT: 43 U/L (ref 9–46)
AST: 25 U/L (ref 10–35)
Albumin: 4.6 g/dL (ref 3.6–5.1)
Alkaline phosphatase (APISO): 78 U/L (ref 35–144)
BUN: 12 mg/dL (ref 7–25)
CO2: 31 mmol/L (ref 20–32)
Calcium: 9.7 mg/dL (ref 8.6–10.3)
Chloride: 104 mmol/L (ref 98–110)
Creat: 1.16 mg/dL (ref 0.70–1.33)
GFR, Est African American: 83 mL/min/{1.73_m2} (ref >=60)
GFR, Est Non African American: 72 mL/min/{1.73_m2} (ref >=60)
Globulin: 2.5 g/dL (calc) (ref 1.9–3.7)
Glucose, Bld: 116 mg/dL — ABNORMAL HIGH (ref 65–99)
Potassium: 4.5 mmol/L (ref 3.5–5.3)
Sodium: 141 mmol/L (ref 135–146)
Total Bilirubin: 0.6 mg/dL (ref 0.2–1.2)
Total Protein: 7.1 g/dL (ref 6.1–8.1)

## 2018-06-20 LAB — LIPID PANEL
Cholesterol: 117 mg/dL
HDL: 36 mg/dL — ABNORMAL LOW
LDL Cholesterol (Calc): 58 mg/dL
Non-HDL Cholesterol (Calc): 81 mg/dL
Total CHOL/HDL Ratio: 3.3 (calc)
Triglycerides: 148 mg/dL

## 2018-06-20 LAB — CBC
HCT: 47.2 % (ref 38.5–50.0)
Hemoglobin: 16 g/dL (ref 13.2–17.1)
MCH: 26.8 pg — ABNORMAL LOW (ref 27.0–33.0)
MCHC: 33.9 g/dL (ref 32.0–36.0)
MCV: 78.9 fL — ABNORMAL LOW (ref 80.0–100.0)
MPV: 11.3 fL (ref 7.5–12.5)
Platelets: 199 10*3/uL (ref 140–400)
RBC: 5.98 10*6/uL — ABNORMAL HIGH (ref 4.20–5.80)
RDW: 14.2 % (ref 11.0–15.0)
WBC: 6.7 10*3/uL (ref 3.8–10.8)

## 2018-06-20 LAB — IRON: Iron: 55 ug/dL (ref 50–180)

## 2018-06-20 LAB — FERRITIN: Ferritin: 54 ng/mL (ref 38–380)

## 2018-06-20 LAB — PSA: PSA: 1.7 ng/mL (ref <=4.0)

## 2018-06-24 ENCOUNTER — Other Ambulatory Visit: Payer: Self-pay | Admitting: *Deleted

## 2018-06-24 DIAGNOSIS — R790 Abnormal level of blood mineral: Secondary | ICD-10-CM

## 2018-07-09 ENCOUNTER — Telehealth: Payer: Self-pay

## 2018-07-09 NOTE — Telephone Encounter (Signed)
Pt seen in [facility] ER this morning for pain. He was told that they found gall stones and offered to go ahead and operate on him this morning, pt states he declines. Pt noted that they also mentioned he may have an ulcer, which concerned him due since Dr Madilyn Fireman has mentioned some anemia to him before.   Patient states he is in a lot of pain and wants to know if ok to wait until Monday for ER f/u or if he should be doing something sooner?  Please advise

## 2018-07-10 NOTE — Telephone Encounter (Signed)
Patient advised. He states yesterday he called his GI and he will be seeing them tomorrow morning. Keeping his follow up here on Monday with Dr Madilyn Fireman. No further needs at this time.

## 2018-07-10 NOTE — Telephone Encounter (Signed)
Called following number.  Unable to leave a voicemail message.: [phone].   Please call patient: If he is in more severe pain then he may need to get it scheduled sooner but if it seems to have calm down and it is bearable and he can work on to staying hydrated and eat a really bland diet then okay to wait until Monday.

## 2018-07-14 ENCOUNTER — Encounter: Payer: Self-pay | Admitting: Family Medicine

## 2018-07-14 ENCOUNTER — Ambulatory Visit (INDEPENDENT_AMBULATORY_CARE_PROVIDER_SITE_OTHER): Payer: 59 | Admitting: Family Medicine

## 2018-07-14 VITALS — BP 117/74 | HR 86 | Temp 98.1°F | Ht 70.0 in | Wt 237.0 lb

## 2018-07-14 DIAGNOSIS — M545 Low back pain, unspecified: Secondary | ICD-10-CM | POA: Insufficient documentation

## 2018-07-14 DIAGNOSIS — K2100 Gastro-esophageal reflux disease with esophagitis, without bleeding: Secondary | ICD-10-CM

## 2018-07-14 DIAGNOSIS — G8929 Other chronic pain: Secondary | ICD-10-CM | POA: Insufficient documentation

## 2018-07-14 DIAGNOSIS — R1013 Epigastric pain: Secondary | ICD-10-CM | POA: Diagnosis not present

## 2018-07-14 DIAGNOSIS — K8018 Calculus of gallbladder with other cholecystitis without obstruction: Secondary | ICD-10-CM

## 2018-07-14 NOTE — Progress Notes (Signed)
Established Patient Office Visit  Subjective:  Patient ID: Nicholas Christian, male    DOB: [birth year]  Age: 53 y.o. MRN: [id number]  CC:  Chief Complaint  Patient presents with  . Abdominal Pain    HPI Nicholas Christian presents for gallstones.  Starting last week he had abrupt onset of burning and persistent abdominal pain so he eventually went to the emergency department.  He said he never had that happen before..  Which seems to actually have improved since then.  The pain was located mostly in the epigastric area and towards the right upper quadrant it would radiate.  They offered to do immediate cholecystectomy but he decided to go home.  He then had a consultation with the surgeon on May 29.  Surgeon did recommend laparoscopic cholecystectomy and discuss potential risks of surgery.  Very same day he also had a consultation with gastroenterology and they had recommended endoscopy to evaluate for possible gastritis and/or ulcer as well as going ahead and doing a colonoscopy with recent episode of diverticulitis.  CT abdomen pelvis in the emergency department on May 27 showed the gallbladder was distended and there was a stone in the gallbladder neck or cystic duct.  Stone was present previously but the gallbladder distention was new on this current CT.  There was also some mild fatty infiltration the liver and no renal or ureteral calculi.  He has been taking some Nexium since that time.  Continues to be bothered by reflux symptoms.  He said his wife will feel acid come back up in the back of his throat.  Sometimes he will experience brash.  He also still continues to have some pain over his left low back.  Between the rib cage and hip bone.  He wonders if it could be his kidney.  Past Medical History:  Diagnosis Date  . Complication of anesthesia    low O2 sat after kidney surgery ~ 2009  . DDD (degenerative disc disease), cervical   . Diabetes mellitus without complication ([place])   .  Diverticulitis   . Eosinophilic esophagitis    hx of- [facility]  . GERD (gastroesophageal reflux disease)   . High triglycerides   . History of hiatal hernia   . History of kidney stones    hx  . Low HDL (under 40)   . Low testosterone   . Migraines   . Normal cardiac stress test    DOBUTAMINE STRESS ECHO 10 years ago   . OSA (obstructive sleep apnea)    no cpap used  . Pneumonia 10/15   no problems now    Past Surgical History:  Procedure Laterality Date  . EDG  01/03/12  . ESOPHAGEAL DILATION  07/18/11   Dr. Tora Duck  . KIDNEY STONE SURGERY     laser at [facility] MICRODISCECTOMY Left 01/18/2014   Procedure: Left Thoracic five-six microdiskectomy;  Surgeon: Newman Pies, MD;  Location: [facility];  Service: Neurosurgery;  Laterality: Left;  . NASAL SINUS SURGERY     Removal polyps   . TONSILLECTOMY AND ADENOIDECTOMY    . TYMPANOSTOMY TUBE PLACEMENT      Family History  Problem Relation Age of Onset  . Prostate cancer Father        oldr when dx   . Diabetes Maternal Grandmother     Social History   Socioeconomic History  . Marital status: Married    Spouse name: Not on file  .  Number of children: 3  . Years of education: Not on file  . Highest education level: Not on file  Occupational History  . 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   Social Needs  . Financial resource strain: Not on file  . Food insecurity:    Worry: Not on file    Inability: Not on file  . Transportation needs:    Medical: Not on file    Non-medical: Not on file  Tobacco Use  . Smoking status: Never Smoker  . Smokeless tobacco: Never Used  Substance and Sexual Activity  . Alcohol use: No  . Drug use: No  . Sexual activity: Yes    Partners: Female  Lifestyle  . Physical activity:    Days per week: Not on file    Minutes per session: Not on file  . Stress: Not on file  Relationships  . Social connections:     Talks on phone: Not on file    Gets together: Not on file    Attends religious service: Not on file    Active member of club or organization: Not on file    Attends meetings of clubs or organizations: Not on file    Relationship status: Not on file  . Intimate partner violence:    Fear of current or ex partner: Not on file    Emotionally abused: Not on file    Physically abused: Not on file    Forced sexual activity: Not on file  Other Topics Concern  . Not on file  Social History Narrative   No regular exercise. 1 caffeine /soda per day.     Outpatient Medications Prior to Visit  Medication Sig Dispense Refill  . AMBULATORY NON FORMULARY MEDICATION Medication Name: Glucometer and strips and lancet to check once a day.  Dx 250.00 1 Units 11  . atorvastatin (LIPITOR) 20 MG tablet Take 1 tablet (20 mg total) by mouth daily. 90 tablet 3  . cholecalciferol (VITAMIN D3) 25 MCG (1000 UT) tablet Take 1,000 Units by mouth daily.    . clotrimazole (LOTRIMIN) 1 % cream Apply to affected area 2 times daily (Patient taking differently: Apply 1 application topically 2 (two) times daily. ) 15 g 0  . Dulaglutide (TRULICITY) 1.5 VP/7.1GG SOPN Inject 1.5 mg into the skin every 7 (seven) days. 4 pen 3  . ferrous sulfate 325 (65 FE) MG tablet Take 1 tablet by mouth daily with breakfast.    . Insulin Pen Needle (PEN NEEDLES) 31G X 8 MM MISC Use for administraiton of Victoza daily. 100 each 2  . ketoconazole (NIZORAL) 2 % cream Apply 1 application topically 2 (two) times daily. X 3 weeks. (Patient taking differently: Apply 1 application topically 2 (two) times daily as needed for irritation. ) 45 g 0  . KOMBIGLYZE XR 06-998 MG TB24 TAKE 1 TABLET BY MOUTH DAILY (Patient taking differently: Take 1 tablet by mouth daily. ) 90 tablet 1  . lisinopril (PRINIVIL,ZESTRIL) 10 MG tablet Take 1 tablet (10 mg total) by mouth daily. 90 tablet 1  . omeprazole (PRILOSEC) 20 MG capsule Take 20 mg by mouth 2 (two) times  daily.   11   No facility-administered medications prior to visit.     Allergies  Allergen Reactions  . Niacin Other (See Comments)    Reaction not recalled  . Onglyza [Saxagliptin] Nausea Only    ROS Review of Systems    Objective:    Physical Exam  Constitutional: He is oriented to person, place, and time. He appears well-developed and well-nourished.  HENT:  Head: Normocephalic and atraumatic.  Cardiovascular: Normal rate, regular rhythm and normal heart sounds.  Pulmonary/Chest: Effort normal and breath sounds normal.  Abdominal: Soft. Bowel sounds are normal. He exhibits no distension and no mass. There is no abdominal tenderness. There is no rebound and no guarding.  Musculoskeletal:     Comments: Tenderness over the left low back just above the hip bone and before the ribs.  He is tender over the muscle tissue.  Neurological: He is alert and oriented to person, place, and time.  Skin: Skin is warm and dry.  Psychiatric: He has a normal mood and affect. His behavior is normal.    BP 117/74   Pulse 86   Temp 98.1 F (36.7 C)   Ht 5\' 10"  (1.778 m)   Wt 237 lb (107.5 kg)   SpO2 98%   BMI 34.01 kg/m  Wt Readings from Last 3 Encounters:  07/14/18 237 lb (107.5 kg)  06/16/18 236 lb (107 kg)  04/30/18 234 lb (106.1 kg)     There are no preventive care reminders to display for this patient.  There are no preventive care reminders to display for this patient.  No results found for: TSH Lab Results  Component Value Date   WBC 6.7 06/16/2018   HGB 16.0 06/16/2018   HCT 47.2 06/16/2018   MCV 78.9 (L) 06/16/2018   PLT 199 06/16/2018   Lab Results  Component Value Date   NA 141 06/16/2018   K 4.5 06/16/2018   CO2 31 06/16/2018   GLUCOSE 116 (H) 06/16/2018   BUN 12 06/16/2018   CREATININE 1.16 06/16/2018   BILITOT 0.6 06/16/2018   ALKPHOS 50 04/22/2018   AST 25 06/16/2018   ALT 43 06/16/2018   PROT 7.1 06/16/2018   ALBUMIN 3.4 (L) 04/22/2018   CALCIUM  9.7 06/16/2018   ANIONGAP 9 04/22/2018   Lab Results  Component Value Date   CHOL 117 06/16/2018   Lab Results  Component Value Date   HDL 36 (L) 06/16/2018   Lab Results  Component Value Date   LDLCALC 58 06/16/2018   Lab Results  Component Value Date   TRIG 148 06/16/2018   Lab Results  Component Value Date   CHOLHDL 3.3 06/16/2018   Lab Results  Component Value Date   HGBA1C 7.5 (A) 06/16/2018      Assessment & Plan:   Problem List Items Addressed This Visit      Digestive   Gastroesophageal reflux disease with esophagitis     Other   Epigastric pain - Primary   Chronic left-sided low back pain without sciatica    Reassured him that I do not think that this is his kidney.  I really think it is more musculoskeletal it sets actually below the rib cage.  And I was able to press on the area and it was quite tender.  Consider further work-up and possible formal physical therapy if continuing to bother him.       Other Visit Diagnoses    Calculus of gallbladder with cholecystitis of other acuity without obstruction         GERD-discussed options.  I think he is just not 100% convinced that the pain that he experienced was necessarily from his gallbladder.  Because he is also been experiencing a lot of reflux and brash symptoms lately he is more concerned about a potential ulcer.  At this point his pain seems to have improved and resolved and his abdominal exam is benign.  So I think it would be okay for him to move forward the endoscopy/colonoscopy first.  Currently he is scheduled for July 1 encouraged him to call their office to see if he can get in a little sooner.  If everything comes back reassuring then it may point more towards the gallbladder as the cause of his discomfort and pain.  In which case he may want to consider removal.  Certainly that would be up to him.  He did let me know that GI did go ahead and retest him for celiac.  Continue with Nexium and work on  bland diet.  Reviewed that he needs to avoid greasy, spicy, acidic foods etc.  Epigastric pain-improved significantly from previous.  Felt to be secondary to gallstones.  Again he is very hesitant to get his gallbladder out unless he absolutely needs to.  I think it is okay to move forward endoscopy/colonoscopy initially and then reserve the possible cholecystectomy if symptoms return.    No orders of the defined types were placed in this encounter.   Follow-up: Return if symptoms worsen or fail to improve.    Beatrice Lecher, MD

## 2018-07-14 NOTE — Assessment & Plan Note (Signed)
Reassured him that I do not think that this is his kidney.  I really think it is more musculoskeletal it sets actually below the rib cage.  And I was able to press on the area and it was quite tender.  Consider further work-up and possible formal physical therapy if continuing to bother him.

## 2018-08-15 ENCOUNTER — Telehealth: Payer: Self-pay

## 2018-08-15 ENCOUNTER — Telehealth: Payer: Self-pay | Admitting: *Deleted

## 2018-08-15 DIAGNOSIS — Z20822 Contact with and (suspected) exposure to covid-19: Secondary | ICD-10-CM

## 2018-08-15 NOTE — Telephone Encounter (Signed)
Phone call to pt.; spoke with his wife to schedule for COVID testing.  Appt. Scheduled for 7/6 @ 1:15 PM.  Advised to wear a mask and remain in the car for testing.  Verb. Understanding.

## 2018-08-15 NOTE — Telephone Encounter (Signed)
-----   Message from Hali Marry, MD sent at 08/15/2018 10:41 AM EDT ----- Please schedule for COVID testing. Trip planned to [place].  Needs COVID testing before allowed to enter country.

## 2018-08-15 NOTE — Telephone Encounter (Addendum)
Attempted to contact pt to schedule COVID Testing; unable to leave message; no answer.Marland Kitchen  ----- Message from Hali Marry, MD sent at 08/15/2018 10:41 AM EDT ----- Please schedule for COVID testing. Trip planned to [place].  Needs COVID testing before allowed to enter country.

## 2018-08-18 ENCOUNTER — Other Ambulatory Visit: Payer: 59

## 2018-08-18 DIAGNOSIS — Z20822 Contact with and (suspected) exposure to covid-19: Secondary | ICD-10-CM

## 2018-08-19 ENCOUNTER — Ambulatory Visit: Payer: 59 | Admitting: Family Medicine

## 2018-08-23 LAB — NOVEL CORONAVIRUS, NAA: SARS-CoV-2, NAA: NOT DETECTED

## 2018-09-16 ENCOUNTER — Ambulatory Visit: Payer: 59 | Admitting: Family Medicine

## 2018-09-22 ENCOUNTER — Other Ambulatory Visit: Payer: Self-pay

## 2018-09-22 ENCOUNTER — Ambulatory Visit: Payer: 59 | Admitting: Family Medicine

## 2018-09-22 ENCOUNTER — Encounter: Payer: Self-pay | Admitting: Family Medicine

## 2018-09-22 VITALS — BP 120/74 | HR 90 | Ht 70.0 in | Wt 234.0 lb

## 2018-09-22 DIAGNOSIS — E119 Type 2 diabetes mellitus without complications: Secondary | ICD-10-CM | POA: Diagnosis not present

## 2018-09-22 DIAGNOSIS — I10 Essential (primary) hypertension: Secondary | ICD-10-CM

## 2018-09-22 DIAGNOSIS — L0390 Cellulitis, unspecified: Secondary | ICD-10-CM | POA: Diagnosis not present

## 2018-09-22 LAB — POCT GLYCOSYLATED HEMOGLOBIN (HGB A1C): Hemoglobin A1C: 6.3 % — AB (ref 4.0–5.6)

## 2018-09-22 MED ORDER — DOXYCYCLINE HYCLATE 100 MG PO TABS: 100.0000 mg | ORAL_TABLET | Freq: Two times a day (BID) | ORAL | 0 refills | Status: DC

## 2018-09-22 NOTE — Progress Notes (Signed)
Established Patient Office Visit  Subjective:  Patient ID: Nicholas Christian, male    DOB: 05-04-1965  Age: 53 y.o. MRN: [id number]  CC:  Chief Complaint  Patient presents with  . Diabetes  . Cellulitis    L axilla x 1 wk painful, area is red and he hasn't used any compresses on the area  . Hypertension    HPI Nicholas Christian presents for   Hypertension- Pt denies chest pain, SOB, dizziness, or heart palpitations.  Taking meds as directed w/o problems.  Denies medication side effects.   Diabetes - no hypoglycemic events. No wounds or sores that are not healing well. No increased thirst or urination. Checking glucose at home. Taking medications as prescribed without any side effects.  He has lost some weight.  He also complained of redness and tenderness in the left axilla that started at the end of last week.  He denies any known injury or trauma.  He says he tried to squeeze on it to see if there was any type of pustule or pus but did not find anything.  He has not tried any treatments he says it just seems to be getting more sore in the area of redness seems to be getting a little larger.     Past Medical History:  Diagnosis Date  . Complication of anesthesia    low O2 sat after kidney surgery ~ 2009  . DDD (degenerative disc disease), cervical   . Diabetes mellitus without complication ([place])   . Diverticulitis   . Eosinophilic esophagitis    hx of- [facility]  . GERD (gastroesophageal reflux disease)   . High triglycerides   . History of hiatal hernia   . History of kidney stones    hx  . Low HDL (under 40)   . Low testosterone   . Migraines   . Normal cardiac stress test    DOBUTAMINE STRESS ECHO 10 years ago   . OSA (obstructive sleep apnea)    no cpap used  . Pneumonia 10/15   no problems now    Past Surgical History:  Procedure Laterality Date  . EDG  01/03/12  . ESOPHAGEAL DILATION  07/18/11   Dr. Tora Duck  . KIDNEY STONE SURGERY     laser at  [place] MICRODISCECTOMY Left 01/18/2014   Procedure: Left Thoracic five-six microdiskectomy;  Surgeon: Newman Pies, MD;  Location: [place] [facility];  Service: Neurosurgery;  Laterality: Left;  . NASAL SINUS SURGERY     Removal polyps   . TONSILLECTOMY AND ADENOIDECTOMY    . TYMPANOSTOMY TUBE PLACEMENT      Family History  Problem Relation Age of Onset  . Prostate cancer Father        oldr when dx   . Diabetes Maternal Grandmother     Social History   Socioeconomic History  . Marital status: Married    Spouse name: Not on file  . Number of children: 3  . Years of education: Not on file  . Highest education level: Not on file  Occupational History  . 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   Social Needs  . Financial resource strain: Not on file  . Food insecurity    Worry: Not on file    Inability: Not on file  . Transportation needs    Medical: Not on file    Non-medical: Not on file  Tobacco Use  . Smoking  status: Never Smoker  . Smokeless tobacco: Never Used  Substance and Sexual Activity  . Alcohol use: No  . Drug use: No  . Sexual activity: Yes    Partners: Female  Lifestyle  . Physical activity    Days per week: Not on file    Minutes per session: Not on file  . Stress: Not on file  Relationships  . Social Herbalist on phone: Not on file    Gets together: Not on file    Attends religious service: Not on file    Active member of club or organization: Not on file    Attends meetings of clubs or organizations: Not on file    Relationship status: Not on file  . Intimate partner violence    Fear of current or ex partner: Not on file    Emotionally abused: Not on file    Physically abused: Not on file    Forced sexual activity: Not on file  Other Topics Concern  . Not on file  Social History Narrative   No regular exercise. 1 caffeine /soda per day.     Outpatient Medications  Prior to Visit  Medication Sig Dispense Refill  . AMBULATORY NON FORMULARY MEDICATION Medication Name: Glucometer and strips and lancet to check once a day.  Dx 250.00 1 Units 11  . atorvastatin (LIPITOR) 20 MG tablet Take 1 tablet (20 mg total) by mouth daily. 90 tablet 3  . cholecalciferol (VITAMIN D3) 25 MCG (1000 UT) tablet Take 1,000 Units by mouth daily.    . Dulaglutide (TRULICITY) 1.5 OZ/3.6UY SOPN Inject 1.5 mg into the skin every 7 (seven) days. 4 pen 3  . Insulin Pen Needle (PEN NEEDLES) 31G X 8 MM MISC Use for administraiton of Victoza daily. 100 each 2  . KOMBIGLYZE XR 06-998 MG TB24 TAKE 1 TABLET BY MOUTH DAILY (Patient taking differently: Take 1 tablet by mouth daily. ) 90 tablet 1  . lisinopril (PRINIVIL,ZESTRIL) 10 MG tablet Take 1 tablet (10 mg total) by mouth daily. 90 tablet 1  . omeprazole (PRILOSEC) 40 MG capsule Take 1 capsule by mouth.    . clotrimazole (LOTRIMIN) 1 % cream Apply to affected area 2 times daily (Patient taking differently: Apply 1 application topically 2 (two) times daily. ) 15 g 0  . ferrous sulfate 325 (65 FE) MG tablet Take 1 tablet by mouth daily with breakfast.    . ketoconazole (NIZORAL) 2 % cream Apply 1 application topically 2 (two) times daily. X 3 weeks. (Patient taking differently: Apply 1 application topically 2 (two) times daily as needed for irritation. ) 45 g 0  . omeprazole (PRILOSEC) 20 MG capsule Take 20 mg by mouth 2 (two) times daily.   11   No facility-administered medications prior to visit.     Allergies  Allergen Reactions  . Niacin Other (See Comments)    Reaction not recalled  . Onglyza [Saxagliptin] Nausea Only    ROS Review of Systems    Objective:    Physical Exam  Constitutional: He is oriented to person, place, and time. He appears well-developed and well-nourished.  HENT:  Head: Normocephalic and atraumatic.  Cardiovascular: Normal rate, regular rhythm and normal heart sounds.  Pulmonary/Chest: Effort normal  and breath sounds normal.  Neurological: He is alert and oriented to person, place, and time.  Skin: Skin is warm and dry.  In the left axilla he has an approximately 4 x 8 cm erythematous area over a  fatty deposit.  More medially there is a just a small maybe 1 to 1-1/2 cm firm area but no enlarged pore or pustule present.  He actually has a slightly erythematous spot on the inner upper arm as well that may be approximately a centimeter in size.  Psychiatric: He has a normal mood and affect. His behavior is normal.    BP 120/74   Pulse 90   Ht 5\' 10"  (1.778 m)   Wt 234 lb (106.1 kg)   SpO2 98%   BMI 33.58 kg/m  Wt Readings from Last 3 Encounters:  09/22/18 234 lb (106.1 kg)  07/14/18 237 lb (107.5 kg)  06/16/18 236 lb (107 kg)     Health Maintenance Due  Topic Date Due  . INFLUENZA VACCINE  09/13/2018    There are no preventive care reminders to display for this patient.  No results found for: TSH Lab Results  Component Value Date   WBC 6.7 06/16/2018   HGB 16.0 06/16/2018   HCT 47.2 06/16/2018   MCV 78.9 (L) 06/16/2018   PLT 199 06/16/2018   Lab Results  Component Value Date   NA 141 06/16/2018   K 4.5 06/16/2018   CO2 31 06/16/2018   GLUCOSE 116 (H) 06/16/2018   BUN 12 06/16/2018   CREATININE 1.16 06/16/2018   BILITOT 0.6 06/16/2018   ALKPHOS 50 04/22/2018   AST 25 06/16/2018   ALT 43 06/16/2018   PROT 7.1 06/16/2018   ALBUMIN 3.4 (L) 04/22/2018   CALCIUM 9.7 06/16/2018   ANIONGAP 9 04/22/2018   Lab Results  Component Value Date   CHOL 117 06/16/2018   Lab Results  Component Value Date   HDL 36 (L) 06/16/2018   Lab Results  Component Value Date   LDLCALC 58 06/16/2018   Lab Results  Component Value Date   TRIG 148 06/16/2018   Lab Results  Component Value Date   CHOLHDL 3.3 06/16/2018   Lab Results  Component Value Date   HGBA1C 6.3 (A) 09/22/2018      Assessment & Plan:   Problem List Items Addressed This Visit       Cardiovascular and Mediastinum   ESSENTIAL HYPERTENSION, BENIGN - Primary    Well controlled. Continue current regimen. Follow up in 3 months.         Endocrine   Controlled type 2 diabetes mellitus without complication, without long-term current use of insulin (HCC)    A1C is much better today at 6.3.  Well controlled. Continue current regimen. Follow up in  3 months.       Relevant Orders   POCT glycosylated hemoglobin (Hb A1C) (Completed)    Other Visit Diagnoses    Cellulitis of skin         Cellulitis-no sign of actual abscess.  We will go ahead and treat with oral doxycycline if not improving or resolving or if it suddenly gets worse then please let us know sooner rather than later.  Meds ordered this encounter  Medications  . doxycycline (VIBRA-TABS) 100 MG tablet    Sig: Take 1 tablet (100 mg total) by mouth 2 (two) times daily.    Dispense:  20 tablet    Refill:  0    Follow-up: Return in about 3 months (around 12/23/2018) for Diabetes follow-up.    Beatrice Lecher, MD

## 2018-09-22 NOTE — Assessment & Plan Note (Signed)
Well controlled. Continue current regimen. Follow up in  3 months.  

## 2018-09-22 NOTE — Assessment & Plan Note (Signed)
A1C is much better today at 6.3.  Well controlled. Continue current regimen. Follow up in  3 months.

## 2018-10-09 ENCOUNTER — Encounter: Payer: Self-pay | Admitting: Family Medicine

## 2018-10-09 ENCOUNTER — Ambulatory Visit (INDEPENDENT_AMBULATORY_CARE_PROVIDER_SITE_OTHER): Payer: 59 | Admitting: Family Medicine

## 2018-10-09 ENCOUNTER — Other Ambulatory Visit: Payer: Self-pay

## 2018-10-09 VITALS — BP 139/78 | HR 61 | Ht 70.0 in | Wt 236.0 lb

## 2018-10-09 DIAGNOSIS — M5416 Radiculopathy, lumbar region: Secondary | ICD-10-CM | POA: Diagnosis not present

## 2018-10-09 DIAGNOSIS — R2232 Localized swelling, mass and lump, left upper limb: Secondary | ICD-10-CM

## 2018-10-09 MED ORDER — PREDNISONE 20 MG PO TABS: 40.0000 mg | ORAL_TABLET | Freq: Every day | ORAL | 0 refills | Status: DC

## 2018-10-09 NOTE — Progress Notes (Signed)
Acute Office Visit  Subjective:    Patient ID: Nicholas Christian, male    DOB: 12-03-1965, 53 y.o.   MRN: [id number]  Chief Complaint  Patient presents with  . Cellulitis    L axilla. he reports that the area is not painful. on monday the knots were bigger but have since gone down and are hard.    HPI Patient is in today for follow-up cellulitis in the left axilla.  I saw him about 17 days ago and it looked like there was some erythematous skin but no sign of actual abscess that can be drained so we discussed starting doxycycline.  He took the medication for 10 days and completed the course.  Says the actual redness on the skin seem to resolve pretty quickly after filling the antibiotics.  But does not feel like the swollen area started going down until Monday, approximately 3 days ago but he does feel like they are smaller than they were there just not completely gone so he was concerned.  No abnormal fevers or chills or night sweats.  No other lumps on the body that he is noticed.  He also c/of of low back pain. Says had a partial Discectomy at [facility]6 years ago  Now c/o low back pain that radiates to the left side of the low back .  He is also getting pain and a mostly numb tingly feeling going down the outside of both legs all the way down to his feet on the outside of the lower leg and foot.  He recently changed jobs and is now working as a Freight forwarder at [employer] so he has been standing for long periods of time and has noticed that when he works it seems to gradually get worse throughout the day.   Past Medical History:  Diagnosis Date  . Complication of anesthesia    low O2 sat after kidney surgery ~ 2009  . DDD (degenerative disc disease), cervical   . Diabetes mellitus without complication ([place])   . Diverticulitis   . Eosinophilic esophagitis    hx of- [facility]  . GERD (gastroesophageal reflux disease)   . High triglycerides   . History of hiatal hernia   . History of kidney  stones    hx  . Low HDL (under 40)   . Low testosterone   . Migraines   . Normal cardiac stress test    DOBUTAMINE STRESS ECHO 10 years ago   . OSA (obstructive sleep apnea)    no cpap used  . Pneumonia 10/15   no problems now    Past Surgical History:  Procedure Laterality Date  . EDG  01/03/12  . ESOPHAGEAL DILATION  07/18/11   Dr. Tora Duck  . KIDNEY STONE SURGERY     laser at [place] MICRODISCECTOMY Left 01/18/2014   Procedure: Left Thoracic five-six microdiskectomy;  Surgeon: Newman Pies, MD;  Location: [place] [facility];  Service: Neurosurgery;  Laterality: Left;  . NASAL SINUS SURGERY     Removal polyps   . TONSILLECTOMY AND ADENOIDECTOMY    . TYMPANOSTOMY TUBE PLACEMENT      Family History  Problem Relation Age of Onset  . Prostate cancer Father        oldr when dx   . Diabetes Maternal Grandmother     Social History   Socioeconomic History  . Marital status: Married    Spouse name: Not on file  . Number of children:  3  . Years of education: Not on file  . Highest education level: Not on file  Occupational History  . 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   Social Needs  . Financial resource strain: Not on file  . Food insecurity    Worry: Not on file    Inability: Not on file  . Transportation needs    Medical: Not on file    Non-medical: Not on file  Tobacco Use  . Smoking status: Never Smoker  . Smokeless tobacco: Never Used  Substance and Sexual Activity  . Alcohol use: No  . Drug use: No  . Sexual activity: Yes    Partners: Female  Lifestyle  . Physical activity    Days per week: Not on file    Minutes per session: Not on file  . Stress: Not on file  Relationships  . Social Herbalist on phone: Not on file    Gets together: Not on file    Attends religious service: Not on file    Active member of club or organization: Not on file    Attends meetings of clubs  or organizations: Not on file    Relationship status: Not on file  . Intimate partner violence    Fear of current or ex partner: Not on file    Emotionally abused: Not on file    Physically abused: Not on file    Forced sexual activity: Not on file  Other Topics Concern  . Not on file  Social History Narrative   No regular exercise. 1 caffeine /soda per day.     Outpatient Medications Prior to Visit  Medication Sig Dispense Refill  . AMBULATORY NON FORMULARY MEDICATION Medication Name: Glucometer and strips and lancet to check once a day.  Dx 250.00 1 Units 11  . atorvastatin (LIPITOR) 20 MG tablet Take 1 tablet (20 mg total) by mouth daily. 90 tablet 3  . cholecalciferol (VITAMIN D3) 25 MCG (1000 UT) tablet Take 1,000 Units by mouth daily.    . Dulaglutide (TRULICITY) 1.5 0000000 SOPN Inject 1.5 mg into the skin every 7 (seven) days. 4 pen 3  . Insulin Pen Needle (PEN NEEDLES) 31G X 8 MM MISC Use for administraiton of Victoza daily. 100 each 2  . KOMBIGLYZE XR 06-998 MG TB24 TAKE 1 TABLET BY MOUTH DAILY (Patient taking differently: Take 1 tablet by mouth daily. ) 90 tablet 1  . lisinopril (PRINIVIL,ZESTRIL) 10 MG tablet Take 1 tablet (10 mg total) by mouth daily. 90 tablet 1  . omeprazole (PRILOSEC) 40 MG capsule Take 1 capsule by mouth.    . doxycycline (VIBRA-TABS) 100 MG tablet Take 1 tablet (100 mg total) by mouth 2 (two) times daily. 20 tablet 0   No facility-administered medications prior to visit.     Allergies  Allergen Reactions  . Niacin Other (See Comments)    Reaction not recalled  . Onglyza [Saxagliptin] Nausea Only    ROS     Objective:    Physical Exam  Constitutional: He is oriented to person, place, and time. He appears well-developed and well-nourished.  HENT:  Head: Normocephalic and atraumatic.  Eyes: Conjunctivae and EOM are normal.  Cardiovascular: Normal rate.  Pulmonary/Chest: Effort normal.  Musculoskeletal:     Comments:  Hip, knee, ankle  strength is out of 5 bilaterally. Neg straight leg raise.   Neurological: He is alert and oriented to person, place, and time.  Skin: Skin  is dry. No pallor.  In the left axilla he has a very small approximately 2 to 3 mm papule that feels superficial but deeper to that area in the mid axilla he has an elongated area that almost feels somewhat linear.  Measuring approximately 2-1/2 x 0.5 cm in size.  Not the typical shape that we would feel for a lymph node.  No superficial rash or erythema  Psychiatric: He has a normal mood and affect. His behavior is normal.  Vitals reviewed.   BP 139/78   Pulse 61   Ht 5\' 10"  (1.778 m)   Wt 236 lb (107 kg)   SpO2 98%   BMI 33.86 kg/m  Wt Readings from Last 3 Encounters:  10/09/18 236 lb (107 kg)  09/22/18 234 lb (106.1 kg)  07/14/18 237 lb (107.5 kg)    There are no preventive care reminders to display for this patient.  There are no preventive care reminders to display for this patient.   No results found for: TSH Lab Results  Component Value Date   WBC 6.7 06/16/2018   HGB 16.0 06/16/2018   HCT 47.2 06/16/2018   MCV 78.9 (L) 06/16/2018   PLT 199 06/16/2018   Lab Results  Component Value Date   NA 141 06/16/2018   K 4.5 06/16/2018   CO2 31 06/16/2018   GLUCOSE 116 (H) 06/16/2018   BUN 12 06/16/2018   CREATININE 1.16 06/16/2018   BILITOT 0.6 06/16/2018   ALKPHOS 50 04/22/2018   AST 25 06/16/2018   ALT 43 06/16/2018   PROT 7.1 06/16/2018   ALBUMIN 3.4 (L) 04/22/2018   CALCIUM 9.7 06/16/2018   ANIONGAP 9 04/22/2018   Lab Results  Component Value Date   CHOL 117 06/16/2018   Lab Results  Component Value Date   HDL 36 (L) 06/16/2018   Lab Results  Component Value Date   LDLCALC 58 06/16/2018   Lab Results  Component Value Date   TRIG 148 06/16/2018   Lab Results  Component Value Date   CHOLHDL 3.3 06/16/2018   Lab Results  Component Value Date   HGBA1C 6.3 (A) 09/22/2018       Assessment & Plan:   Problem  List Items Addressed This Visit    None    Visit Diagnoses    Acute radicular low back pain    -  Primary   Relevant Medications   predniSONE (DELTASONE) 20 MG tablet   Axillary mass, left       Relevant Orders   [place] AXILLA LEFT     Left axillary mass-unclear etiology.  Certainly it could be a deeper pocket of infection that still present versus a lymph node though is not typically shaped like a lymph node so I would like to get ultrasound for further work-up we will try to get that scheduled next week.  Certainly if any point he feels like it is getting worse or starts to get erythema again then please let us know.  Low back pain with bilateral radiculopathy-discussed treatment options.  He has had a partial discectomy previously and is not interested in any type of surgical intervention.  We discussed with radiculopathy this is likely a disc issue.  We discussed physical therapy including home therapy or even formal physical therapy.  We also discussed the possibility of using steroids briefly for acute symptoms.  We also discussed changing how he stands frequently at work otherwise this will continue to be a problem.  And discussed considering  further imaging in the future.  We will go ahead and give him a short round of prednisone for now.  After that okay to use an anti-inflammatory as needed.  If not improving over the next 3 weeks then please let me know.  Meds ordered this encounter  Medications  . predniSONE (DELTASONE) 20 MG tablet    Sig: Take 2 tablets (40 mg total) by mouth daily with breakfast.    Dispense:  10 tablet    Refill:  0     Beatrice Lecher, MD

## 2018-10-09 NOTE — Patient Instructions (Signed)
Checkout [facility] for physical therapy on YouTube for low back pain/problems.  Call if you are not improving over the next 3 weeks.

## 2018-10-10 ENCOUNTER — Other Ambulatory Visit: Payer: Self-pay

## 2018-10-10 ENCOUNTER — Ambulatory Visit (INDEPENDENT_AMBULATORY_CARE_PROVIDER_SITE_OTHER): Payer: 59

## 2018-10-10 DIAGNOSIS — R2232 Localized swelling, mass and lump, left upper limb: Secondary | ICD-10-CM

## 2018-11-27 ENCOUNTER — Telehealth: Payer: Self-pay | Admitting: Family Medicine

## 2018-11-27 NOTE — Telephone Encounter (Signed)
Approved today ([employer]) [id number];Review Type:Prior Auth;Coverage Start Date:10/28/2018;Coverage End Date:11/26/2021; Pharmacy aware.

## 2018-12-08 ENCOUNTER — Other Ambulatory Visit: Payer: Self-pay | Admitting: Family Medicine

## 2018-12-08 MED ORDER — ATORVASTATIN CALCIUM 20 MG PO TABS: 20.0000 mg | ORAL_TABLET | Freq: Every day | ORAL | 3 refills | Status: DC

## 2018-12-23 ENCOUNTER — Other Ambulatory Visit: Payer: Self-pay

## 2018-12-23 ENCOUNTER — Ambulatory Visit (INDEPENDENT_AMBULATORY_CARE_PROVIDER_SITE_OTHER): Payer: BC Managed Care – PPO | Admitting: Family Medicine

## 2018-12-23 ENCOUNTER — Encounter: Payer: Self-pay | Admitting: *Deleted

## 2018-12-23 ENCOUNTER — Encounter: Payer: Self-pay | Admitting: Family Medicine

## 2018-12-23 VITALS — BP 127/85 | HR 68 | Ht 70.0 in | Wt 233.0 lb

## 2018-12-23 DIAGNOSIS — I10 Essential (primary) hypertension: Secondary | ICD-10-CM | POA: Diagnosis not present

## 2018-12-23 DIAGNOSIS — E119 Type 2 diabetes mellitus without complications: Secondary | ICD-10-CM | POA: Diagnosis not present

## 2018-12-23 DIAGNOSIS — R202 Paresthesia of skin: Secondary | ICD-10-CM

## 2018-12-23 DIAGNOSIS — L989 Disorder of the skin and subcutaneous tissue, unspecified: Secondary | ICD-10-CM

## 2018-12-23 DIAGNOSIS — R790 Abnormal level of blood mineral: Secondary | ICD-10-CM | POA: Diagnosis not present

## 2018-12-23 LAB — POCT GLYCOSYLATED HEMOGLOBIN (HGB A1C): Hemoglobin A1C: 6.7 % — AB (ref 4.0–5.6)

## 2018-12-23 NOTE — Progress Notes (Addendum)
Established Patient Office Visit  Subjective:  Patient ID: Nicholas Christian, male    DOB: 03-22-1965  Age: 53 y.o. MRN: [id number]  CC:  Chief Complaint  Patient presents with  . Diabetes    HPI Nicholas Christian presents for   Diabetes - no hypoglycemic events. No wounds or sores that are not healing well. No increased thirst or urination. Checking glucose at home. Taking medications as prescribed without any side effects.  Hypertension- Pt denies chest pain, SOB, dizziness, or heart palpitations.  Taking meds as directed w/o problems.  Denies medication side effects.    F/U low iron store seen in May.  He was supposed to go back to the lab to recheck levels after couple of months on supplemental iron.  He also has a couple skin lesions that he would like me to look at including one on his right facial cheek near his right jaw.  Its not bothersome but it is raised.  He has 1 on his back he wants me to look at as well.  He also complains of pain in his legs.  He says anytime he stands for more than 30 to 30 minutes he gets pain in his legs.  He gets a burning sensation in his left hip that is quite intense he sometimes gets some mild pain in the right hip but it is mostly the left that is bothering him.  Then he will feel like his legs are going numb and tingly.  With his new job working at [employer] he does have to stand for long periods he is able to make it through the day but it is quite uncomfortable.  He does have a history of partial discectomy at C[facility] 6 years ago.   Past Medical History:  Diagnosis Date  . Complication of anesthesia    low O2 sat after kidney surgery ~ 2009  . DDD (degenerative disc disease), cervical   . Diabetes mellitus without complication ([place])   . Diverticulitis   . Eosinophilic esophagitis    hx of- [facility]  . GERD (gastroesophageal reflux disease)   . High triglycerides   . History of hiatal hernia   . History of kidney stones    hx  . Low  HDL (under 40)   . Low testosterone   . Migraines   . Normal cardiac stress test    DOBUTAMINE STRESS ECHO 10 years ago   . OSA (obstructive sleep apnea)    no cpap used  . Pneumonia 10/15   no problems now    Past Surgical History:  Procedure Laterality Date  . EDG  01/03/12  . ESOPHAGEAL DILATION  07/18/11   Dr. Tora Duck  . KIDNEY STONE SURGERY     laser at [place] MICRODISCECTOMY Left 01/18/2014   Procedure: Left Thoracic five-six microdiskectomy;  Surgeon: Newman Pies, MD;  Location: [facility];  Service: Neurosurgery;  Laterality: Left;  . NASAL SINUS SURGERY     Removal polyps   . TONSILLECTOMY AND ADENOIDECTOMY    . TYMPANOSTOMY TUBE PLACEMENT      Family History  Problem Relation Age of Onset  . Prostate cancer Father        oldr when dx   . Diabetes Maternal Grandmother     Social History   Socioeconomic History  . Marital status: Married    Spouse name: Not on file  . Number of children: 3  . Years of  education: Not on file  . Highest education level: Not on file  Occupational History  . 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   Social Needs  . Financial resource strain: Not on file  . Food insecurity    Worry: Not on file    Inability: Not on file  . Transportation needs    Medical: Not on file    Non-medical: Not on file  Tobacco Use  . Smoking status: Never Smoker  . Smokeless tobacco: Never Used  Substance and Sexual Activity  . Alcohol use: No  . Drug use: No  . Sexual activity: Yes    Partners: Female  Lifestyle  . Physical activity    Days per week: Not on file    Minutes per session: Not on file  . Stress: Not on file  Relationships  . Social Herbalist on phone: Not on file    Gets together: Not on file    Attends religious service: Not on file    Active member of club or organization: Not on file    Attends meetings of clubs or organizations: Not  on file    Relationship status: Not on file  . Intimate partner violence    Fear of current or ex partner: Not on file    Emotionally abused: Not on file    Physically abused: Not on file    Forced sexual activity: Not on file  Other Topics Concern  . Not on file  Social History Narrative   No regular exercise. 1 caffeine /soda per day.     Outpatient Medications Prior to Visit  Medication Sig Dispense Refill  . AMBULATORY NON FORMULARY MEDICATION Medication Name: Glucometer and strips and lancet to check once a day.  Dx 250.00 1 Units 11  . atorvastatin (LIPITOR) 20 MG tablet Take 1 tablet (20 mg total) by mouth daily. 90 tablet 3  . cholecalciferol (VITAMIN D3) 25 MCG (1000 UT) tablet Take 1,000 Units by mouth daily.    . Dulaglutide (TRULICITY) 1.5 0000000 SOPN Inject 1.5 mg into the skin every 7 (seven) days. 4 pen 3  . Insulin Pen Needle (PEN NEEDLES) 31G X 8 MM MISC Use for administraiton of Victoza daily. 100 each 2  . KOMBIGLYZE XR 06-998 MG TB24 TAKE 1 TABLET BY MOUTH DAILY (Patient taking differently: Take 1 tablet by mouth daily. ) 90 tablet 1  . lisinopril (PRINIVIL,ZESTRIL) 10 MG tablet Take 1 tablet (10 mg total) by mouth daily. 90 tablet 1  . omeprazole (PRILOSEC) 40 MG capsule Take 1 capsule by mouth.    . predniSONE (DELTASONE) 20 MG tablet Take 2 tablets (40 mg total) by mouth daily with breakfast. 10 tablet 0   No facility-administered medications prior to visit.     Allergies  Allergen Reactions  . Niacin Other (See Comments)    Reaction not recalled  . Onglyza [Saxagliptin] Nausea Only    ROS Review of Systems    Objective:    Physical Exam  Constitutional: He is oriented to person, place, and time. He appears well-developed and well-nourished.  HENT:  Head: Normocephalic and atraumatic.  Cardiovascular: Normal rate, regular rhythm and normal heart sounds.  Pulmonary/Chest: Effort normal and breath sounds normal.  Neurological: He is alert and  oriented to person, place, and time.  Skin: Skin is warm and dry.  Patient has a papular scaly lesion on the right cheek near the jawline.  Measures approximately 0.8 cm.  No worrisome lesions on the back  Psychiatric: He has a normal mood and affect. His behavior is normal.    BP 127/85   Pulse 68   Ht 5\' 10"  (1.778 m)   Wt 233 lb (105.7 kg)   SpO2 100%   BMI 33.43 kg/m  Wt Readings from Last 3 Encounters:  12/23/18 233 lb (105.7 kg)  10/09/18 236 lb (107 kg)  09/22/18 234 lb (106.1 kg)     There are no preventive care reminders to display for this patient.  There are no preventive care reminders to display for this patient.  No results found for: TSH Lab Results  Component Value Date   WBC 6.7 06/16/2018   HGB 16.0 06/16/2018   HCT 47.2 06/16/2018   MCV 78.9 (L) 06/16/2018   PLT 199 06/16/2018   Lab Results  Component Value Date   NA 141 06/16/2018   K 4.5 06/16/2018   CO2 31 06/16/2018   GLUCOSE 116 (H) 06/16/2018   BUN 12 06/16/2018   CREATININE 1.16 06/16/2018   BILITOT 0.6 06/16/2018   ALKPHOS 50 04/22/2018   AST 25 06/16/2018   ALT 43 06/16/2018   PROT 7.1 06/16/2018   ALBUMIN 3.4 (L) 04/22/2018   CALCIUM 9.7 06/16/2018   ANIONGAP 9 04/22/2018   Lab Results  Component Value Date   CHOL 117 06/16/2018   Lab Results  Component Value Date   HDL 36 (L) 06/16/2018   Lab Results  Component Value Date   LDLCALC 58 06/16/2018   Lab Results  Component Value Date   TRIG 148 06/16/2018   Lab Results  Component Value Date   CHOLHDL 3.3 06/16/2018   Lab Results  Component Value Date   HGBA1C 6.7 (A) 12/23/2018      Assessment & Plan:   Problem List Items Addressed This Visit      Cardiovascular and Mediastinum   ESSENTIAL HYPERTENSION, BENIGN    Well controlled. Continue current regimen. Follow up in  4 mo.       Relevant Orders   TSH   Vitamin B1   B12   Vitamin B6     Endocrine   Controlled type 2 diabetes mellitus without  complication, without long-term current use of insulin (HCC) - Primary    A1c overall looks good today.  Continue current regimen.  Follow-up in 3 to 4 months. Lab Results  Component Value Date   HGBA1C 6.7 (A) 12/23/2018         Relevant Orders   POCT HgB A1C (Completed)   TSH   Vitamin B1   B12   Vitamin B6    Other Visit Diagnoses    Low iron stores       Relevant Orders   TSH   Vitamin B1   B12   Vitamin B6   Paresthesia of both lower extremities       Relevant Orders   TSH   Vitamin B1   B12   Vitamin B6   Skin lesion of face       Relevant Orders   Dermatology pathology     Declines flu vaccine.     Paresthesias of lower extremities.  Will check thyroid as well as for B12 deficiency, B1 and B6 deficiency as well.  He also has a history of low iron so we will plan to recheck that as well.  Skin lesion on right facial cheek concerning for possible squamous cell versus inflamed seborrheic keratosis.  Discussed treatment  options.  Shave biopsy performed based on concerning appearance..  No orders of the defined types were placed in this encounter.   Shave Biopsy Procedure Note  Pre-operative Diagnosis: Suspicious lesion  Post-operative Diagnosis: same  Locations:right facial cheek  Indications: irritation   Anesthesia: Lidocaine 1% with epinephrine without added sodium bicarbonate  Procedure Details   Patient informed of the risks (including bleeding and infection) and benefits of the  procedure and Verbal informed consent obtained.  The lesion and surrounding area were given a sterile prep using chlorhexidine and draped in the usual sterile fashion. A scalpel was used to shave an area of skin approximately 0. 9cm by 0. 9cm.  Hemostasis achieved with alumuninum chloride. Antibiotic ointment and a sterile dressing applied.  The specimen was sent for pathologic examination. The patient tolerated the procedure well.  Aluminum chloride used for  hemostasis.  EBL: trace  Findings: Await pathology.  Condition: Stable  Complications: none.  Plan: 1. Instructed to keep the wound dry and covered for 24-48h and clean thereafter. 2. Warning signs of infection were reviewed.   3. Recommended that the patient use OTC acetaminophen as needed for pain.  4. Return PRN.   Follow-up: Return in about 4 months (around 04/22/2019) for Diabetes follow-up.    Beatrice Lecher, MD

## 2018-12-23 NOTE — Assessment & Plan Note (Signed)
Well controlled. Continue current regimen. Follow up in  4 mo 

## 2018-12-23 NOTE — Assessment & Plan Note (Signed)
A1c overall looks good today.  Continue current regimen.  Follow-up in 3 to 4 months. Lab Results  Component Value Date   HGBA1C 6.7 (A) 12/23/2018

## 2018-12-23 NOTE — Patient Instructions (Signed)
You are due for repeat echocardiogram in 2 years.

## 2018-12-24 ENCOUNTER — Other Ambulatory Visit: Payer: Self-pay | Admitting: Family Medicine

## 2018-12-25 ENCOUNTER — Ambulatory Visit (INDEPENDENT_AMBULATORY_CARE_PROVIDER_SITE_OTHER): Payer: BC Managed Care – PPO | Admitting: Sports Medicine

## 2018-12-25 ENCOUNTER — Ambulatory Visit (INDEPENDENT_AMBULATORY_CARE_PROVIDER_SITE_OTHER): Payer: BC Managed Care – PPO

## 2018-12-25 ENCOUNTER — Other Ambulatory Visit: Payer: Self-pay

## 2018-12-25 ENCOUNTER — Encounter: Payer: Self-pay | Admitting: Sports Medicine

## 2018-12-25 DIAGNOSIS — M25552 Pain in left hip: Secondary | ICD-10-CM

## 2018-12-25 DIAGNOSIS — M1612 Unilateral primary osteoarthritis, left hip: Secondary | ICD-10-CM | POA: Insufficient documentation

## 2018-12-25 MED ORDER — PREDNISONE 50 MG PO TABS: ORAL_TABLET | ORAL | 0 refills | Status: DC

## 2018-12-25 MED ORDER — MELOXICAM 15 MG PO TABS: ORAL_TABLET | ORAL | 3 refills | Status: DC

## 2018-12-25 NOTE — Progress Notes (Signed)
Subjective:    CC: Bilateral hip pain  HPI: This is a pleasant 53 year old male, for many months now has had pain in both hips, left worse than right in the groin, worse with weightbearing and twisting, he also has pain in his back with radiation down both legs to the ankles, medially and anteriorly on the thigh.  He does have a history of a lumbar microdiscectomy, symptoms are moderate, persistent, no progressive weakness, no bowel or other dysfunction, saddle numbness, constitutional symptoms.  I reviewed the past medical history, family history, social history, surgical history, and allergies today and no changes were needed.  Please see the problem list section below in epic for further details.  Past Medical History: Past Medical History:  Diagnosis Date  . Complication of anesthesia    low O2 sat after kidney surgery ~ 2009  . DDD (degenerative disc disease), cervical   . Diabetes mellitus without complication ([place])   . Diverticulitis   . Eosinophilic esophagitis    hx of- [facility]  . GERD (gastroesophageal reflux disease)   . High triglycerides   . History of hiatal hernia   . History of kidney stones    hx  . Low HDL (under 40)   . Low testosterone   . Migraines   . Normal cardiac stress test    DOBUTAMINE STRESS ECHO 10 years ago   . OSA (obstructive sleep apnea)    no cpap used  . Pneumonia 10/15   no problems now   Past Surgical History: Past Surgical History:  Procedure Laterality Date  . EDG  01/03/12  . ESOPHAGEAL DILATION  07/18/11   Dr. Tora Duck  . KIDNEY STONE SURGERY     laser at [facility] MICRODISCECTOMY Left 01/18/2014   Procedure: Left Thoracic five-six microdiskectomy;  Surgeon: Newman Pies, MD;  Location: [facility];  Service: Neurosurgery;  Laterality: Left;  . NASAL SINUS SURGERY     Removal polyps   . TONSILLECTOMY AND ADENOIDECTOMY    . TYMPANOSTOMY TUBE PLACEMENT     Social History: Social  History   Socioeconomic History  . Marital status: Married    Spouse name: Not on file  . Number of children: 3  . Years of education: Not on file  . Highest education level: Not on file  Occupational History  . 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   Social Needs  . Financial resource strain: Not on file  . Food insecurity    Worry: Not on file    Inability: Not on file  . Transportation needs    Medical: Not on file    Non-medical: Not on file  Tobacco Use  . Smoking status: Never Smoker  . Smokeless tobacco: Never Used  Substance and Sexual Activity  . Alcohol use: No  . Drug use: No  . Sexual activity: Yes    Partners: Female  Lifestyle  . Physical activity    Days per week: Not on file    Minutes per session: Not on file  . Stress: Not on file  Relationships  . Social Herbalist on phone: Not on file    Gets together: Not on file    Attends religious service: Not on file    Active member of club or organization: Not on file    Attends meetings of clubs or organizations: Not on file    Relationship status: Not on file  Other Topics Concern  . Not on file  Social History Narrative   No regular exercise. 1 caffeine /soda per day.    Family History: Family History  Problem Relation Age of Onset  . Prostate cancer Father        oldr when dx   . Diabetes Maternal Grandmother    Allergies: Allergies  Allergen Reactions  . Niacin Other (See Comments)    Reaction not recalled  . Onglyza [Saxagliptin] Nausea Only   Medications: See med rec.  Review of Systems: No fevers, chills, night sweats, weight loss, chest pain, or shortness of breath.   Objective:    General: Well Developed, well nourished, and in no acute distress.  Neuro: Alert and oriented x3, extra-ocular muscles intact, sensation grossly intact.  HEENT: Normocephalic, atraumatic, pupils equal round reactive to light, neck supple, no masses, no lymphadenopathy,  thyroid nonpalpable.  Skin: Warm and dry, no rashes. Cardiac: Regular rate and rhythm, no murmurs rubs or gallops, no lower extremity edema.  Respiratory: Clear to auscultation bilaterally. Not using accessory muscles, speaking in full sentences. Bilateral hips: ROM IR: 30 degrees with reproduction of pain, ER: 60 Deg, Flexion: 120 Deg, Extension: 100 Deg, Abduction: 45 Deg, Adduction: 45 Deg Strength IR: 5/5, ER: 5/5, Flexion: 5/5, Extension: 5/5, Abduction: 5/5, Adduction: 5/5 Pelvic alignment unremarkable to inspection and palpation. Standing hip rotation and gait without trendelenburg / unsteadiness. Greater trochanter without tenderness to palpation. No tenderness over piriformis. No SI joint tenderness and normal minimal SI movement.  Impression and Recommendations:    Left hip pain Multifactorial left hip pain. He does have some pain with internal rotation consistent with a hip joint source of pain. There is also paresthesias going down the legs to the ankles, consistent with lumbar spinal stenosis, he does report history of a laminectomy and microdiscectomy. Starting conservatively, prednisone, PT, x-rays of lumbar spine and hips, meloxicam, return to see me in 4 to 6 weeks, MRI for interventional planning if no better, if pain declares itself as hip origin we will consider hip joint injections. Because he has paresthesias on the anterolateral thighs and has gained a good deal of weight we certainly also need to consider meralgia paresthetica.   ___________________________________________ Gwen Her. Dianah Field, M.D., ABFM., CAQSM. Primary Care and Sports Medicine [facility]  Adjunct Professor of [employer]

## 2018-12-25 NOTE — Assessment & Plan Note (Signed)
Multifactorial left hip pain. He does have some pain with internal rotation consistent with a hip joint source of pain. There is also paresthesias going down the legs to the ankles, consistent with lumbar spinal stenosis, he does report history of a laminectomy and microdiscectomy. Starting conservatively, prednisone, PT, x-rays of lumbar spine and hips, meloxicam, return to see me in 4 to 6 weeks, MRI for interventional planning if no better, if pain declares itself as hip origin we will consider hip joint injections. Because he has paresthesias on the anterolateral thighs and has gained a good deal of weight we certainly also need to consider meralgia paresthetica.

## 2018-12-28 LAB — VITAMIN B1: Vitamin B1 (Thiamine): 7 nmol/L — ABNORMAL LOW (ref 8–30)

## 2018-12-28 LAB — VITAMIN B6: Vitamin B6: 4.7 ng/mL (ref 2.1–21.7)

## 2018-12-28 LAB — VITAMIN B12: Vitamin B-12: 501 pg/mL (ref 200–1100)

## 2018-12-28 LAB — TSH: TSH: 1.8 mIU/L (ref 0.40–4.50)

## 2019-01-01 ENCOUNTER — Other Ambulatory Visit: Payer: Self-pay

## 2019-01-01 ENCOUNTER — Ambulatory Visit (INDEPENDENT_AMBULATORY_CARE_PROVIDER_SITE_OTHER): Payer: BC Managed Care – PPO | Admitting: Physical Therapy

## 2019-01-01 DIAGNOSIS — M6281 Muscle weakness (generalized): Secondary | ICD-10-CM | POA: Diagnosis not present

## 2019-01-01 DIAGNOSIS — R262 Difficulty in walking, not elsewhere classified: Secondary | ICD-10-CM

## 2019-01-01 DIAGNOSIS — M25552 Pain in left hip: Secondary | ICD-10-CM

## 2019-01-01 NOTE — Therapy (Signed)
[place], [place], [address] Phone: [phone]   Fax:  [phone]  Physical Therapy Evaluation  Patient Details  Name: Nicholas Christian MRN: [id number] Date of Birth: 1965-08-16 Referring Provider (PT): Aundria Mems, MD   Encounter Date: 01/01/2019  PT End of Session - 01/01/19 0817    Visit Number  1    Number of Visits  13    Date for PT Re-Evaluation  02/27/19    PT Start Time  0800    PT Stop Time  0845    PT Time Calculation (min)  45 min    Activity Tolerance  Patient tolerated treatment well    Behavior During Therapy  [facility] for tasks assessed/performed       Past Medical History:  Diagnosis Date  . Complication of anesthesia    low O2 sat after kidney surgery ~ 2009  . DDD (degenerative disc disease), cervical   . Diabetes mellitus without complication ([place])   . Diverticulitis   . Eosinophilic esophagitis    hx of- [facility]  . GERD (gastroesophageal reflux disease)   . High triglycerides   . History of hiatal hernia   . History of kidney stones    hx  . Low HDL (under 40)   . Low testosterone   . Migraines   . Normal cardiac stress test    DOBUTAMINE STRESS ECHO 10 years ago   . OSA (obstructive sleep apnea)    no cpap used  . Pneumonia 10/15   no problems now    Past Surgical History:  Procedure Laterality Date  . EDG  01/03/12  . ESOPHAGEAL DILATION  07/18/11   Dr. Tora Duck  . KIDNEY STONE SURGERY     laser at [facility] MICRODISCECTOMY Left 01/18/2014   Procedure: Left Thoracic five-six microdiskectomy;  Surgeon: Newman Pies, MD;  Location: [place] [facility];  Service: Neurosurgery;  Laterality: Left;  . NASAL SINUS SURGERY     Removal polyps   . TONSILLECTOMY AND ADENOIDECTOMY    . TYMPANOSTOMY TUBE PLACEMENT      There were no vitals filed for this visit.   Subjective Assessment - 01/01/19 0807    Subjective  Pt arrivng to therapy today reporting bilateral LE pain L>R. Pt reporting his lateral left LE has a feeling of numbness and needle like sensation. Pt reporting that this has been ongoing for several years. Pt had to stop running due to pain. Pt reports, "I feel like I can't lift my feet up anymore".    Pertinent History  lumbar discectomy > 2 years ago, DM, kidney stones and surgery,    Limitations  House hold activities    Currently in Pain?  Yes    Pain Score  3     Pain Location  Hip    Pain Orientation  Right;Left    Pain Descriptors / Indicators  Burning    Pain Type  Chronic pain    Pain Radiating Towards  down left lateral thigh    Pain Onset  More than a month ago    Pain Frequency  Intermittent    Aggravating Factors   standing prolonged    Pain Relieving Factors  nothing    Effect of Pain on Daily Activities  difficutly with standing at work         [facility] PT Assessment - 01/01/19 0001      Assessment   Medical  Diagnosis  L hip pain M25.552    Referring Provider (PT)  Aundria Mems, MD    Onset Date/Surgical Date  --   It has been progressing over the last 1-2 years   Prior Therapy  no      Precautions   Precautions  None      Restrictions   Weight Bearing Restrictions  No      Balance Screen   Has the patient fallen in the past 6 months  No    Is the patient reluctant to leave their home because of a fear of falling?   No      Home Film/video editor residence    Living Arrangements  Spouse/significant other      Prior Function   Level of Independence  Independent    Vocation  Full time employment    Leisure  "I use to enjoy running"      Cognition   Overall Cognitive Status  Within Functional Limits for tasks assessed      Observation/Other Assessments   Focus on Therapeutic Outcomes (FOTO)   33% limitation      Sensation   Light Touch  Appears Intact   in LE dermatomes     Posture/Postural Control    Posture/Postural Control  Postural limitations    Postural Limitations  Rounded Shoulders;Forward head      ROM / Strength   AROM / PROM / Strength  Strength      Strength   Overall Strength  Deficits    Strength Assessment Site  Hip    Right/Left Hip  Right;Left    Right Hip Flexion  5/5    Right Hip Extension  5/5    Right Hip ABduction  5/5    Right Hip ADduction  5/5    Left Hip Flexion  4/5    Left Hip Extension  4/5    Left Hip ABduction  4/5    Left Hip ADduction  4/5      Flexibility   Soft Tissue Assessment /Muscle Length  yes    Hamstrings  R: 58 degrees, L: 62 degrees      Palpation   Palpation comment  TTP on lateral hip/femoral head.       Special Tests    Special Tests  Hip Special Tests    Hip Special Tests   Trendelenberg Test;Thomas Test;Hip Scouring;Piriformis Test      Trendelenburg Test   Findings  Negative    Comments  bilaterally      Thomas Test    Findings  Positive    Comments  bilaterally      Hip Scouring   Findings  Negative    Comments  bilaterally      Piriformis Test   Findings  Positive    Side   Left    Comments  Negative on R      Ambulation/Gait   Gait Pattern  Within Functional Limits;Step-through pattern                Objective measurements completed on examination: See above findings.      [facility] Adult PT Treatment/Exercise - 01/01/19 0001      Exercises   Exercises  Knee/Hip      Knee/Hip Exercises: Stretches   Passive Hamstring Stretch  Right;Left;2 reps;30 seconds    Piriformis Stretch  Both;1 rep    Piriformis Stretch Limitations  modified      Knee/Hip Exercises: Supine  Bridges  Strengthening;5 reps    Other Supine Knee/Hip Exercises  single knee to chest x 1 each LE             PT Education - 01/01/19 2052    Education Details  HEP, PT POC    Person(s) Educated  Patient    Methods  Explanation;Demonstration;Other (comment)   email pt his beginning HEP from [place], access code  listed in plan   Comprehension  Verbalized understanding;Returned demonstration       PT Short Term Goals - 01/01/19 2054      PT SHORT TERM GOAL #1   Title  Pt will be independent in his HEP.    Time  3    Period  Weeks    Status  New    Target Date  01/21/19        PT Long Term Goals - 01/01/19 2113      PT LONG TERM GOAL #1   Title  Pt will be independent in HEP and progression.    Time  6    Period  Weeks    Status  New    Target Date  02/12/19      PT LONG TERM GOAL #2   Title  Pt will improve his bilateral hamstring flexibilty to >/= 75 degrees in order to improve functional mobility.    Baseline  R: 58 degrees, L: 62 degrees    Time  6    Period  Weeks    Status  New    Target Date  02/12/19      PT LONG TERM GOAL #3   Title  Pt will be able to to report performing yard work with pain </= 2/10.    Time  6    Period  Weeks    Status  New    Target Date  02/12/19      PT LONG TERM GOAL #4   Title  Pt will be able to amb >/= 1 mile in </= 15 minutes with no pain reported.    Time  6    Period  Weeks    Status  New    Target Date  02/12/19      PT LONG TERM GOAL #5   Title  Pt will improve his L LE strength to 5/5 in order to improve functional mobility and gait.    Time  6    Period  Weeks    Status  New    Target Date  02/12/19             Plan - 01/01/19 0844    Clinical Impression Statement  Pt arriving today for PT evaluation reporting chronic L hip pain that radiates into his groin and down his leg. Pt reporting no pain at rest. Pt did however report his pain had increased to the point he had to stop running. Upon examination, pt pressented with +Thomas test bilaterally and decreased flexibility in bilateral hamstrings. Pt with mild weakness noted in L LE og 4/5 strength. Pt was issued a HEP for LE stretching and lumbar stretching. Skilled PT needed to continue to assess pt's L hip pain and progress pt toward his PLOF with the below  interventions.    Personal Factors and Comorbidities  Comorbidity 3+    Comorbidities  GERD, migraines, OSA, fatty liver, kidney stones, back pain, colitis, bilateral inguinal hernia, HTN    Examination-Activity Limitations  Stand;Sit    Examination-Participation Restrictions  [employer]  Work;Driving    Stability/Clinical Decision Making  Stable/Uncomplicated    Clinical Decision Making  Low    Rehab Potential  Good    PT Frequency  2x / week    PT Duration  6 weeks    PT Treatment/Interventions  Cryotherapy;Ultrasound;Traction;Moist Heat;Iontophoresis 4mg /ml Dexamethasone;Electrical Stimulation;Gait training;Stair training;Balance training;Therapeutic exercise;Therapeutic activities;Functional mobility training;Neuromuscular re-education;Patient/family education;Manual techniques;Dry needling;Passive range of motion;Taping    PT Next Visit Plan  LE strengthening, lumbar stretching, further assess Lumbar ROM, core strengthening    PT Home Exercise Plan  Access Code: [email or web address]    Consulted and Agree with Plan of Care  Patient       Patient will benefit from skilled therapeutic intervention in order to improve the following deficits and impairments:  Pain, Decreased strength, Impaired flexibility, Decreased range of motion, Difficulty walking  Visit Diagnosis: Pain in left hip  Muscle weakness (generalized)  Difficulty in walking, not elsewhere classified     Problem List Patient Active Problem List   Diagnosis Date Noted  . Left hip pain 12/25/2018  . Epigastric pain 07/14/2018  . Chronic left-sided low back pain without sciatica 07/14/2018  . History of diverticulitis 04/22/2018  . Calculus of gallbladder without cholecystitis without obstruction 04/22/2018  . Non-recurrent bilateral inguinal hernia without obstruction or gangrene 03/17/2018  . Umbilical hernia without obstruction and without gangrene 03/17/2018  . History of Clostridium difficile colitis 05/09/2015  . Herniated  nucleus pulposus, thoracic 01/18/2014  . Eosinophilic esophagitis [id number]  . Obesity 02/16/2011  . Controlled type 2 diabetes mellitus without complication, without long-term current use of insulin ([place]) 02/07/2011  . OSA (obstructive sleep apnea) 11/16/2009  . ESSENTIAL HYPERTENSION, BENIGN 11/16/2009  . BACK PAIN, THORACIC REGION 11/24/2007  . DIVERTICULOSIS OF COLON 06/25/2007  . Fatty liver disease, nonalcoholic [id number]  . GAD (generalized anxiety disorder) 11/20/2005  . MIGRAINE, UNSPEC., W/O INTRACTABLE MIGRAINE 11/20/2005  . Gastroesophageal reflux disease with esophagitis 11/20/2005  . KIDNEY STONE - NEPHROLITHIASIS 11/20/2005    Oretha Caprice, PT 01/01/2019, 9:39 PM  [facility] [place], [place], [address] Phone: [phone]   Fax:  [phone]  Name: Nicholas Christian MRN: [id number] Date of Birth: 16-Feb-1965

## 2019-01-02 ENCOUNTER — Encounter: Payer: Self-pay | Admitting: Family Medicine

## 2019-01-06 ENCOUNTER — Ambulatory Visit (INDEPENDENT_AMBULATORY_CARE_PROVIDER_SITE_OTHER): Payer: BC Managed Care – PPO | Admitting: Physical Therapy

## 2019-01-06 ENCOUNTER — Encounter: Payer: Self-pay | Admitting: Physical Therapy

## 2019-01-06 ENCOUNTER — Other Ambulatory Visit: Payer: Self-pay

## 2019-01-06 DIAGNOSIS — M6281 Muscle weakness (generalized): Secondary | ICD-10-CM | POA: Diagnosis not present

## 2019-01-06 DIAGNOSIS — M25552 Pain in left hip: Secondary | ICD-10-CM | POA: Diagnosis not present

## 2019-01-06 NOTE — Patient Instructions (Signed)
Access Code: [email or web address]  URL: [email or web address]  Date: 01/06/2019  Prepared by: Kerin Perna   Exercises  Supine Bridge - 10 reps - 2 sets - 5 seconds hold - 1x daily - 7x weekly  Hooklying Single Knee to Chest - 2 reps - 1 sets - 20 hold - 2x daily - 7x weekly  Seated Hamstring Stretch - 3 reps - 1 sets - 20-30 seconds hold - 2x daily - 7x weekly  Seated Piriformis Stretch - 3 reps - 1 sets - 20-30 seconds hold - 2x daily - 7x weekly  Seated Hip Flexor Stretch - 3 reps - 1 sets - 20-30 hold - 2x daily - 7x weekly  Standing Hamstring Stretch with Step - 3 reps - 1 sets - 20-30 seconds hold - 2x daily - 7x weekly  Gastroc Stretch on Step - 3 reps - 1 sets - 20-30 seconds hold - 2x daily - 7x weekly  Patient Education  Biomedical scientist

## 2019-01-06 NOTE — Therapy (Signed)
[place] [address] [place], [place], [address] Phone: [phone]   Fax:  [phone]  Physical Therapy Treatment  Patient Details  Name: Nicholas Christian MRN: [id number] Date of Birth: 1965/07/22 Referring Provider (PT): Aundria Mems, MD   Encounter Date: 01/06/2019  PT End of Session - 01/06/19 0849    Visit Number  2    Number of Visits  13    Date for PT Re-Evaluation  02/27/19    PT Start Time  0849   Pt arrived late   PT Stop Time  0930    PT Time Calculation (min)  41 min    Activity Tolerance  Patient tolerated treatment well    Behavior During Therapy  [facility] for tasks assessed/performed       Past Medical History:  Diagnosis Date  . Complication of anesthesia    low O2 sat after kidney surgery ~ 2009  . DDD (degenerative disc disease), cervical   . Diabetes mellitus without complication ([place])   . Diverticulitis   . Eosinophilic esophagitis    hx of- [facility]  . GERD (gastroesophageal reflux disease)   . High triglycerides   . History of hiatal hernia   . History of kidney stones    hx  . Low HDL (under 40)   . Low testosterone   . Migraines   . Normal cardiac stress test    DOBUTAMINE STRESS ECHO 10 years ago   . OSA (obstructive sleep apnea)    no cpap used  . Pneumonia 10/15   no problems now    Past Surgical History:  Procedure Laterality Date  . EDG  01/03/12  . ESOPHAGEAL DILATION  07/18/11   Dr. Tora Duck  . KIDNEY STONE SURGERY     laser at [facility] MICRODISCECTOMY Left 01/18/2014   Procedure: Left Thoracic five-six microdiskectomy;  Surgeon: Newman Pies, MD;  Location: [facility];  Service: Neurosurgery;  Laterality: Left;  . NASAL SINUS SURGERY     Removal polyps   . TONSILLECTOMY AND ADENOIDECTOMY    . TYMPANOSTOMY TUBE PLACEMENT      There were no vitals filed for this visit.  Subjective Assessment - 01/06/19  0939    Subjective  Pt states there hasn't been any change in his hip since the last treatment. Pt reports that he has not performed stretches on HEP, not enough time due to busy work schedule.    Currently in Pain?  Yes    Pain Score  3     Pain Location  Hip    Pain Orientation  Left;Lateral    Pain Descriptors / Indicators  Burning    Pain Type  Chronic pain    Aggravating Factors   standing prolonged time    Pain Relieving Factors  If standing sitll for a prolonged time; movement helps         [facility] PT Assessment - 01/06/19 0001      Assessment   Medical Diagnosis  L hip pain M25.552    Referring Provider (PT)  Aundria Mems, MD    Onset Date/Surgical Date  --   It has been progressing over the last 1-2 years   Prior Therapy  no        [facility] Adult PT Treatment/Exercise - 01/06/19 0001      Self-Care   Self-Care  Other Self-Care Comments    Other Self-Care Comments   Pt educated on  self massage with rolling pin to decrease LE tightness; pt verbalized understanding and returned demo      Knee/Hip Exercises: Stretches   Passive Hamstring Stretch  Right;Left;2 reps;20 seconds   sitting   Hip Flexor Stretch  Left;4 reps;20 seconds   Sitting x 2; thomas stretch x 2   Piriformis Stretch  Right;Left;2 reps;20 seconds   Sitting, pulling knee towards opp shoulder   Gastroc Stretch  Right;Left;2 reps;20 seconds   Heels off of step   Other Knee/Hip Stretches  single knee to chest Rt/Lt 2 reps x20sec       Knee/Hip Exercises: Aerobic   Nustep  L1 x 5 min      Knee/Hip Exercises: Supine   Bridges  Strengthening;10 reps, 5 sec hold    Straight Leg Raises  Strengthening;Left;10 reps    Other Supine Knee/Hip Exercises  ab set 5 reps x 5sec             PT Education - 01/06/19 0940    Education Details  updated HEP, posture and body mechanics handout.    Person(s) Educated  Patient    Methods  Explanation;Demonstration;Verbal cues;Handout    Comprehension   Verbalized understanding;Returned demonstration;Verbal cues required       PT Short Term Goals - 01/01/19 2054      PT SHORT TERM GOAL #1   Title  Pt will be independent in his HEP.    Time  3    Period  Weeks    Status  New    Target Date  01/21/19        PT Long Term Goals - 01/01/19 2113      PT LONG TERM GOAL #1   Title  Pt will be independent in HEP and progression.    Time  6    Period  Weeks    Status  New    Target Date  02/12/19      PT LONG TERM GOAL #2   Title  Pt will improve his bilateral hamstring flexibilty to >/= 75 degrees in order to improve functional mobility.    Baseline  R: 58 degrees, L: 62 degrees    Time  6    Period  Weeks    Status  New    Target Date  02/12/19      PT LONG TERM GOAL #3   Title  Pt will be able to to report performing yard work with pain </= 2/10.    Time  6    Period  Weeks    Status  New    Target Date  02/12/19      PT LONG TERM GOAL #4   Title  Pt will be able to amb >/= 1 mile in </= 15 minutes with no pain reported.    Time  6    Period  Weeks    Status  New    Target Date  02/12/19      PT LONG TERM GOAL #5   Title  Pt will improve his L LE strength to 5/5 in order to improve functional mobility and gait.    Time  6    Period  Weeks    Status  New    Target Date  02/12/19            Plan - 01/06/19 0944    Clinical Impression Statement  Pt reported some increased discomfort in Lt ant hip after 4 min of warm up on nustep;  otherwise tolerated all exercises well. Discussed self care with pt and encouraged incorporating stretches throughout day to decrease tightness in Lt LE. No progress made to goals yet; only second visit. Pt will continue to benefit from further skilled PT intervention for improved functional mobility.    Personal Factors and Comorbidities  Comorbidity 3+    Comorbidities  GERD, migraines, OSA, fatty liver, kidney stones, back pain, colitis, bilateral inguinal hernia, HTN     Examination-Activity Limitations  Stand;Sit    Examination-Participation Restrictions  Yard Work;Driving    Stability/Clinical Decision Making  Stable/Uncomplicated    Rehab Potential  Good    PT Frequency  2x / week    PT Duration  6 weeks    PT Treatment/Interventions  Cryotherapy;Ultrasound;Traction;Moist Heat;Iontophoresis 4mg /ml Dexamethasone;Electrical Stimulation;Gait training;Stair training;Balance training;Therapeutic exercise;Therapeutic activities;Functional mobility training;Neuromuscular re-education;Patient/family education;Manual techniques;Dry needling;Passive range of motion;Taping    PT Next Visit Plan  LE strengthening, lumbar stretching, further assess Lumbar ROM, core strengthening, possible trial of taping or estim, continued encouragement regarding compliance of HEP.     PT Home Exercise Plan  Access Code: [email or web address]    Consulted and Agree with Plan of Care  Patient       Patient will benefit from skilled therapeutic intervention in order to improve the following deficits and impairments:  Pain, Decreased strength, Impaired flexibility, Decreased range of motion, Difficulty walking  Visit Diagnosis: Pain in left hip  Muscle weakness (generalized)     Problem List Patient Active Problem List   Diagnosis Date Noted  . Left hip pain 12/25/2018  . Epigastric pain 07/14/2018  . Chronic left-sided low back pain without sciatica 07/14/2018  . History of diverticulitis 04/22/2018  . Calculus of gallbladder without cholecystitis without obstruction 04/22/2018  . Non-recurrent bilateral inguinal hernia without obstruction or gangrene 03/17/2018  . Umbilical hernia without obstruction and without gangrene 03/17/2018  . History of Clostridium difficile colitis 05/09/2015  . Herniated nucleus pulposus, thoracic 01/18/2014  . Eosinophilic esophagitis 99991111  . Obesity 02/16/2011  . Controlled type 2 diabetes mellitus without complication, without long-term current use  of insulin ([place]) 02/07/2011  . OSA (obstructive sleep apnea) 11/16/2009  . ESSENTIAL HYPERTENSION, BENIGN 11/16/2009  . BACK PAIN, THORACIC REGION 11/24/2007  . DIVERTICULOSIS OF COLON 06/25/2007  . Fatty liver disease, nonalcoholic [id number]  . GAD (generalized anxiety disorder) 11/20/2005  . MIGRAINE, UNSPEC., W/O INTRACTABLE MIGRAINE 11/20/2005  . Gastroesophageal reflux disease with esophagitis 11/20/2005  . KIDNEY STONE - NEPHROLITHIASIS 11/20/2005    Gardiner Rhyme, SPTA 01/06/2019, 9:55 AM   Kerin Perna, PTA 01/06/19 11:08 AM   [place], [place], [address] Phone: [phone]   Fax:  [phone]  Name: Nicholas Christian MRN: [id number] Date of Birth: 09/13/1965

## 2019-01-13 ENCOUNTER — Other Ambulatory Visit: Payer: Self-pay

## 2019-01-13 ENCOUNTER — Ambulatory Visit (INDEPENDENT_AMBULATORY_CARE_PROVIDER_SITE_OTHER): Payer: BC Managed Care – PPO | Admitting: Physical Therapy

## 2019-01-13 ENCOUNTER — Encounter: Payer: Self-pay | Admitting: Physical Therapy

## 2019-01-13 DIAGNOSIS — R262 Difficulty in walking, not elsewhere classified: Secondary | ICD-10-CM | POA: Diagnosis not present

## 2019-01-13 DIAGNOSIS — M6281 Muscle weakness (generalized): Secondary | ICD-10-CM

## 2019-01-13 DIAGNOSIS — M25552 Pain in left hip: Secondary | ICD-10-CM | POA: Diagnosis not present

## 2019-01-13 NOTE — Patient Instructions (Signed)
Access Code: [email or web address]  URL: [email or web address]  Date: 01/13/2019  Prepared by: Madelyn Flavors   Exercises Supine Bridge - 10 reps - 2 sets - 5 seconds hold - 1x daily - 7x weekly Hooklying Single Knee to Chest - 2 reps - 1 sets - 20 hold - 2x daily - 7x weekly Seated Hamstring Stretch - 3 reps - 1 sets - 20-30 seconds hold - 2x daily - 7x weekly Seated Piriformis Stretch - 3 reps - 1 sets - 20-30 seconds hold - 2x daily - 7x weekly Seated Hip Flexor Stretch - 3 reps - 1 sets - 20-30 hold - 2x daily - 7x weekly Standing Hamstring Stretch with Step - 3 reps - 1 sets - 20-30 seconds hold - 2x daily - 7x weekly Gastroc Stretch on Step - 3 reps - 1 sets - 20-30 seconds hold - 2x daily - 7x weekly Patient Education Biomedical scientist Trigger Point Dry Needling

## 2019-01-13 NOTE — Therapy (Addendum)
[place], [place], [address] Phone: [phone]   Fax:  [phone]  Physical Therapy Treatment  Patient Details  Name: Nicholas Christian MRN: [id number] Date of Birth: 10-02-1965 Referring Provider (PT): Aundria Mems, MD   Encounter Date: 01/13/2019  PT End of Session - 01/13/19 0936    Visit Number  3    Number of Visits  13    Date for PT Re-Evaluation  02/27/19    PT Start Time  0934    PT Stop Time  1015    PT Time Calculation (min)  41 min    Activity Tolerance  Patient tolerated treatment well    Behavior During Therapy  [facility] for tasks assessed/performed       Past Medical History:  Diagnosis Date  . Complication of anesthesia    low O2 sat after kidney surgery ~ 2009  . DDD (degenerative disc disease), cervical   . Diabetes mellitus without complication ([place])   . Diverticulitis   . Eosinophilic esophagitis    hx of- [facility]  . GERD (gastroesophageal reflux disease)   . High triglycerides   . History of hiatal hernia   . History of kidney stones    hx  . Low HDL (under 40)   . Low testosterone   . Migraines   . Normal cardiac stress test    DOBUTAMINE STRESS ECHO 10 years ago   . OSA (obstructive sleep apnea)    no cpap used  . Pneumonia 10/15   no problems now    Past Surgical History:  Procedure Laterality Date  . EDG  01/03/12  . ESOPHAGEAL DILATION  07/18/11   Dr. Tora Duck  . KIDNEY STONE SURGERY     laser at [place] MICRODISCECTOMY Left 01/18/2014   Procedure: Left Thoracic five-six microdiskectomy;  Surgeon: Newman Pies, MD;  Location: [place] [facility];  Service: Neurosurgery;  Laterality: Left;  . NASAL SINUS SURGERY     Removal polyps   . TONSILLECTOMY AND ADENOIDECTOMY    . TYMPANOSTOMY TUBE PLACEMENT      There were no vitals filed for this visit.  Subjective Assessment - 01/13/19 0936    Subjective  Patient states pain is inconsitent but no changes overall.    Pertinent History  lumbar discectomy > 2 years ago, DM, kidney stones and surgery,    Currently in Pain?  Yes    Pain Score  2     Pain Location  Hip    Pain Orientation  Left;Lateral    Pain Descriptors / Indicators  Burning                       [facility] Adult PT Treatment/Exercise - 01/13/19 0001      Knee/Hip Exercises: Stretches   Passive Hamstring Stretch  Both;1 rep    Passive Hamstring Stretch Limitations  seated with ankle pumps x 10 bil; nerve glide on leftt; also did in supine left knee flexion/ext    Hip Flexor Stretch  Left;4 reps;30 seconds   sitting   Piriformis Stretch  Left;2 reps;30 seconds   sitting   Gastroc Stretch  Left;2 reps;30 seconds   on edge of step     Knee/Hip Exercises: Aerobic   Nustep  L1 x 5 min      Manual Therapy   Manual Therapy  Soft tissue mobilization;Myofascial release    Manual therapy comments  skilled palpation and monitoring of soft tissue during DN      Soft tissue mobilization  to left gluteals and piriformis, lateral left HS and peroneals    Myofascial Release  to leftt ITB       Trigger Point Dry Needling - 01/13/19 0001    Consent Given?  Yes    Education Handout Provided  Yes    Muscles Treated Back/Hip  Gluteus minimus;Gluteus medius;Piriformis    Dry Needling Comments  left    Gluteus Minimus Response  Twitch response elicited;Palpable increased muscle length    Gluteus Medius Response  Twitch response elicited;Palpable increased muscle length    Piriformis Response  Palpable increased muscle length           PT Education - 01/13/19 1227    Education Details  DN education and aftercare    Person(s) Educated  Patient    Methods  Explanation;Handout    Comprehension  Verbalized understanding       PT Short Term Goals - 01/01/19 2054      PT SHORT TERM GOAL #1   Title  Pt will be independent in his HEP.    Time  3     Period  Weeks    Status  New    Target Date  01/21/19        PT Long Term Goals - 01/01/19 2113      PT LONG TERM GOAL #1   Title  Pt will be independent in HEP and progression.    Time  6    Period  Weeks    Status  New    Target Date  02/12/19      PT LONG TERM GOAL #2   Title  Pt will improve his bilateral hamstring flexibilty to >/= 75 degrees in order to improve functional mobility.    Baseline  R: 58 degrees, L: 62 degrees    Time  6    Period  Weeks    Status  New    Target Date  02/12/19      PT LONG TERM GOAL #3   Title  Pt will be able to to report performing yard work with pain </= 2/10.    Time  6    Period  Weeks    Status  New    Target Date  02/12/19      PT LONG TERM GOAL #4   Title  Pt will be able to amb >/= 1 mile in </= 15 minutes with no pain reported.    Time  6    Period  Weeks    Status  New    Target Date  02/12/19      PT LONG TERM GOAL #5   Title  Pt will improve his L LE strength to 5/5 in order to improve functional mobility and gait.    Time  6    Period  Weeks    Status  New    Target Date  02/12/19            Plan - 01/13/19 1222    Clinical Impression Statement  Patient very tight in LLE and experiencing nerve tension in RLE as well. He has marked tightness and TPs in left gluteus minimus and medius and responded very well to DN here reporting relief at end of treatment. He would benefit from further DN in lateral quads and peroneals as well. Reports only partial compliance with HEP.    Comorbidities  GERD, migraines, OSA, fatty liver, kidney stones, back pain, colitis, bilateral inguinal hernia, HTN    PT Treatment/Interventions  Cryotherapy;Ultrasound;Traction;Moist Heat;Iontophoresis 4mg /ml Dexamethasone;Electrical Stimulation;Gait training;Stair training;Balance training;Therapeutic exercise;Therapeutic activities;Functional mobility training;Neuromuscular re-education;Patient/family education;Manual techniques;Dry  needling;Passive range of motion;Taping    PT Next Visit Plan  Assess DN; DN to lateral HS/ITB/peroneals. LE strengthening, lumbar stretching, further assess Lumbar ROM, core strengthening, try e-stim, try taping    PT Home Exercise Plan  Access Code: [email or web address]       Patient will benefit from skilled therapeutic intervention in order to improve the following deficits and impairments:  Pain, Decreased strength, Impaired flexibility, Decreased range of motion, Difficulty walking  Visit Diagnosis: Pain in left hip  Muscle weakness (generalized)  Difficulty in walking, not elsewhere classified     Problem List Patient Active Problem List   Diagnosis Date Noted  . Left hip pain 12/25/2018  . Epigastric pain 07/14/2018  . Chronic left-sided low back pain without sciatica 07/14/2018  . History of diverticulitis 04/22/2018  . Calculus of gallbladder without cholecystitis without obstruction 04/22/2018  . Non-recurrent bilateral inguinal hernia without obstruction or gangrene 03/17/2018  . Umbilical hernia without obstruction and without gangrene 03/17/2018  . History of Clostridium difficile colitis 05/09/2015  . Herniated nucleus pulposus, thoracic 01/18/2014  . Eosinophilic esophagitis 99991111  . Obesity 02/16/2011  . Controlled type 2 diabetes mellitus without complication, without long-term current use of insulin ([place]) 02/07/2011  . OSA (obstructive sleep apnea) 11/16/2009  . ESSENTIAL HYPERTENSION, BENIGN 11/16/2009  . BACK PAIN, THORACIC REGION 11/24/2007  . DIVERTICULOSIS OF COLON 06/25/2007  . Fatty liver disease, nonalcoholic [id number]  . GAD (generalized anxiety disorder) 11/20/2005  . MIGRAINE, UNSPEC., W/O INTRACTABLE MIGRAINE 11/20/2005  . Gastroesophageal reflux disease with esophagitis 11/20/2005  . KIDNEY STONE - NEPHROLITHIASIS 11/20/2005    Madelyn Flavors PT 01/13/2019, 12:35 PM  [facility] [place]  [place], [place], [address] Phone: [phone]   Fax:  [phone]  Name: Westyn Skousen MRN: [id number] Date of Birth: [birth year]

## 2019-01-16 ENCOUNTER — Ambulatory Visit (INDEPENDENT_AMBULATORY_CARE_PROVIDER_SITE_OTHER): Payer: BC Managed Care – PPO | Admitting: Physical Therapy

## 2019-01-16 ENCOUNTER — Encounter: Payer: Self-pay | Admitting: Physical Therapy

## 2019-01-16 ENCOUNTER — Other Ambulatory Visit: Payer: Self-pay

## 2019-01-16 DIAGNOSIS — M6281 Muscle weakness (generalized): Secondary | ICD-10-CM

## 2019-01-16 DIAGNOSIS — M25552 Pain in left hip: Secondary | ICD-10-CM

## 2019-01-16 DIAGNOSIS — R262 Difficulty in walking, not elsewhere classified: Secondary | ICD-10-CM

## 2019-01-16 NOTE — Therapy (Signed)
[place], [place], [address] Phone: [phone]   Fax:  [phone]  Physical Therapy Treatment  Patient Details  Name: Nicholas Christian MRN: [id number] Date of Birth: 09/12/1965 Referring Provider (PT): Aundria Mems, MD   Encounter Date: 01/16/2019  PT End of Session - 01/16/19 1021    Visit Number  4    Number of Visits  13    Date for PT Re-Evaluation  02/27/19    PT Start Time  [id number]    PT Stop Time  1059    PT Time Calculation (min)  41 min    Activity Tolerance  Patient tolerated treatment well    Behavior During Therapy  [facility] for tasks assessed/performed       Past Medical History:  Diagnosis Date  . Complication of anesthesia    low O2 sat after kidney surgery ~ 2009  . DDD (degenerative disc disease), cervical   . Diabetes mellitus without complication ([place])   . Diverticulitis   . Eosinophilic esophagitis    hx of- [facility]  . GERD (gastroesophageal reflux disease)   . High triglycerides   . History of hiatal hernia   . History of kidney stones    hx  . Low HDL (under 40)   . Low testosterone   . Migraines   . Normal cardiac stress test    DOBUTAMINE STRESS ECHO 10 years ago   . OSA (obstructive sleep apnea)    no cpap used  . Pneumonia 10/15   no problems now    Past Surgical History:  Procedure Laterality Date  . EDG  01/03/12  . ESOPHAGEAL DILATION  07/18/11   Dr. Tora Duck  . KIDNEY STONE SURGERY     laser at [place] MICRODISCECTOMY Left 01/18/2014   Procedure: Left Thoracic five-six microdiskectomy;  Surgeon: Newman Pies, MD;  Location: [place] [facility];  Service: Neurosurgery;  Laterality: Left;  . NASAL SINUS SURGERY     Removal polyps   . TONSILLECTOMY AND ADENOIDECTOMY    . TYMPANOSTOMY TUBE PLACEMENT      There were no vitals filed for this visit.  Subjective Assessment - 01/16/19 1021    Subjective  Patient reports no pain in left hip since last visit. He still has some burning in distal lateral thigh and he had a cramp behind his left knee today.    Pertinent History  lumbar discectomy > 2 years ago, DM, kidney stones and surgery,    Currently in Pain?  Yes    Pain Score  2     Pain Location  Knee    Pain Orientation  Left    Pain Descriptors / Indicators  Sore    Pain Type  Chronic pain                       [facility] Adult PT Treatment/Exercise - 01/16/19 0001      Knee/Hip Exercises: Stretches   Active Hamstring Stretch  Both   10 reps in LAQ position   Passive Hamstring Stretch  Both;1 rep;60 seconds   seated with foot on stool   Hip Flexor Stretch  1 rep;60 seconds;Both    Piriformis Stretch  Both;1 rep;60 seconds    Gastroc Stretch  Both;1 rep;60 seconds    Gastroc Stretch Limitations  on incline      Knee/Hip Exercises: Aerobic   Nustep  L5 x 5 min  Manual Therapy   Manual Therapy  Soft tissue mobilization    Manual therapy comments  skilled palpation and monitoring of soft tissue during DN      Soft tissue mobilization  to right quads, HS and lateral gastroc       Trigger Point Dry Needling - 01/16/19 0001    Consent Given?  Yes    Education Handout Provided  Previously provided    Muscles Treated Lower Quadrant  Rectus femoris;Vastus lateralis;Hamstring;Gastrocnemius    Muscles Treated Back/Hip  Gluteus minimus;Gluteus medius    Dry Needling Comments  right    Rectus femoris Response  Twitch response elicited;Palpable increased muscle length    Vastus lateralis Response  Twitch response elicited;Palpable increased muscle length    Hamstring Response  Twitch response elicited;Palpable increased muscle length    Gastrocnemius Response  Twitch response elicited;Palpable increased muscle length    Gluteus Minimus Response  Twitch response elicited;Palpable increased muscle length    Gluteus Medius Response  Twitch response  elicited;Palpable increased muscle length             PT Short Term Goals - 01/16/19 1256      PT SHORT TERM GOAL #1   Title  Pt will be independent in his HEP.    Status  On-going        PT Long Term Goals - 01/01/19 2113      PT LONG TERM GOAL #1   Title  Pt will be independent in HEP and progression.    Time  6    Period  Weeks    Status  New    Target Date  02/12/19      PT LONG TERM GOAL #2   Title  Pt will improve his bilateral hamstring flexibilty to >/= 75 degrees in order to improve functional mobility.    Baseline  R: 58 degrees, L: 62 degrees    Time  6    Period  Weeks    Status  New    Target Date  02/12/19      PT LONG TERM GOAL #3   Title  Pt will be able to to report performing yard work with pain </= 2/10.    Time  6    Period  Weeks    Status  New    Target Date  02/12/19      PT LONG TERM GOAL #4   Title  Pt will be able to amb >/= 1 mile in </= 15 minutes with no pain reported.    Time  6    Period  Weeks    Status  New    Target Date  02/12/19      PT LONG TERM GOAL #5   Title  Pt will improve his L LE strength to 5/5 in order to improve functional mobility and gait.    Time  6    Period  Weeks    Status  New    Target Date  02/12/19            Plan - 01/16/19 1254    Clinical Impression Statement  Patient presents with significant improvement in left hip pain. Today he had pain mainly in right HS and post knee and reported cramping behind knee this morning. He responded very well again to DN and demonstrated increased ROM with HS stretching post needling.    PT Frequency  2x / week    PT Duration  6 weeks  PT Treatment/Interventions  Cryotherapy;Ultrasound;Traction;Moist Heat;Iontophoresis 4mg /ml Dexamethasone;Electrical Stimulation;Gait training;Stair training;Balance training;Therapeutic exercise;Therapeutic activities;Functional mobility training;Neuromuscular re-education;Patient/family education;Manual techniques;Dry  needling;Passive range of motion;Taping    PT Next Visit Plan  DN as indicated. LE strengthening, lumbar stretching, further assess Lumbar ROM, core strengthening    PT Home Exercise Plan  Access Code: [email or web address]       Patient will benefit from skilled therapeutic intervention in order to improve the following deficits and impairments:  Pain, Decreased strength, Impaired flexibility, Decreased range of motion, Difficulty walking  Visit Diagnosis: Pain in left hip  Muscle weakness (generalized)  Difficulty in walking, not elsewhere classified     Problem List Patient Active Problem List   Diagnosis Date Noted  . Left hip pain 12/25/2018  . Epigastric pain 07/14/2018  . Chronic left-sided low back pain without sciatica 07/14/2018  . History of diverticulitis 04/22/2018  . Calculus of gallbladder without cholecystitis without obstruction 04/22/2018  . Non-recurrent bilateral inguinal hernia without obstruction or gangrene 03/17/2018  . Umbilical hernia without obstruction and without gangrene 03/17/2018  . History of Clostridium difficile colitis 05/09/2015  . Herniated nucleus pulposus, thoracic 01/18/2014  . Eosinophilic esophagitis 99991111  . Obesity 02/16/2011  . Controlled type 2 diabetes mellitus without complication, without long-term current use of insulin ([place]) 02/07/2011  . OSA (obstructive sleep apnea) 11/16/2009  . ESSENTIAL HYPERTENSION, BENIGN 11/16/2009  . BACK PAIN, THORACIC REGION 11/24/2007  . DIVERTICULOSIS OF COLON 06/25/2007  . Fatty liver disease, nonalcoholic [id number]  . GAD (generalized anxiety disorder) 11/20/2005  . MIGRAINE, UNSPEC., W/O INTRACTABLE MIGRAINE 11/20/2005  . Gastroesophageal reflux disease with esophagitis 11/20/2005  . KIDNEY STONE - NEPHROLITHIASIS 11/20/2005   Madelyn Flavors PT 01/16/2019, 1:01 PM  [facility] [place], [place], [address] Phone: [phone]    Fax:  [phone]  Name: Nicholas Christian MRN: [id number] Date of Birth: [birth year]

## 2019-01-19 ENCOUNTER — Other Ambulatory Visit: Payer: Self-pay | Admitting: *Deleted

## 2019-01-19 DIAGNOSIS — I10 Essential (primary) hypertension: Secondary | ICD-10-CM

## 2019-01-19 MED ORDER — LISINOPRIL 10 MG PO TABS: 10.0000 mg | ORAL_TABLET | Freq: Every day | ORAL | 1 refills | Status: DC

## 2019-01-20 ENCOUNTER — Other Ambulatory Visit: Payer: Self-pay

## 2019-01-20 ENCOUNTER — Encounter: Payer: Self-pay | Admitting: Physical Therapy

## 2019-01-20 ENCOUNTER — Ambulatory Visit (INDEPENDENT_AMBULATORY_CARE_PROVIDER_SITE_OTHER): Payer: BC Managed Care – PPO | Admitting: Physical Therapy

## 2019-01-20 DIAGNOSIS — M6281 Muscle weakness (generalized): Secondary | ICD-10-CM | POA: Diagnosis not present

## 2019-01-20 NOTE — Therapy (Addendum)
[place], [place], [address] Phone: [phone]   Fax:  [phone]  Physical Therapy Treatment  Patient Details  Name: Nicholas Christian MRN: [id number] Date of Birth: [birth year] Referring Provider (PT): Aundria Mems, MD   Encounter Date: 01/20/2019  PT End of Session - 01/20/19 1022    Visit Number  5    Number of Visits  13    Date for PT Re-Evaluation  02/27/19    PT Start Time  2952    PT Stop Time  1056    PT Time Calculation (min)  38 min    Activity Tolerance  Patient tolerated treatment well    Behavior During Therapy  [facility] for tasks assessed/performed       Past Medical History:  Diagnosis Date  . Complication of anesthesia    low O2 sat after kidney surgery ~ 2009  . DDD (degenerative disc disease), cervical   . Diabetes mellitus without complication ([place])   . Diverticulitis   . Eosinophilic esophagitis    hx of- [facility]  . GERD (gastroesophageal reflux disease)   . High triglycerides   . History of hiatal hernia   . History of kidney stones    hx  . Low HDL (under 40)   . Low testosterone   . Migraines   . Normal cardiac stress test    DOBUTAMINE STRESS ECHO 10 years ago   . OSA (obstructive sleep apnea)    no cpap used  . Pneumonia 10/15   no problems now    Past Surgical History:  Procedure Laterality Date  . EDG  01/03/12  . ESOPHAGEAL DILATION  07/18/11   Dr. Tora Duck  . KIDNEY STONE SURGERY     laser at [place] MICRODISCECTOMY Left 01/18/2014   Procedure: Left Thoracic five-six microdiskectomy;  Surgeon: Newman Pies, MD;  Location: [place] [facility];  Service: Neurosurgery;  Laterality: Left;  . NASAL SINUS SURGERY     Removal polyps   . TONSILLECTOMY AND ADENOIDECTOMY    . TYMPANOSTOMY TUBE PLACEMENT      There were no vitals filed for this visit.  Subjective Assessment - 01/20/19 1022    Subjective  Pt still reporting no hip pain but has pain in lateral left thigh.    Pertinent History  lumbar discectomy > 2 years ago, DM, kidney stones and surgery,    Currently in Pain?  Yes    Pain Score  2     Pain Location  Leg    Pain Orientation  Left    Pain Descriptors / Indicators  Sore                       [facility] Adult PT Treatment/Exercise - 01/20/19 0001      Knee/Hip Exercises: Stretches   Active Hamstring Stretch  Both    Active Hamstring Stretch Limitations  20 reps ea pendulum     Quad Stretch  Both;1 rep;60 seconds    Quad Stretch Limitations  with strap    Hip Flexor Stretch  1 rep;60 seconds;Both    ITB Stretch  Both;1 rep;60 seconds    ITB Stretch Limitations  SDLY quad stretch position with opp foot on top of knee    Piriformis Stretch  Both;1 rep;30 seconds    Piriformis Stretch Limitations  alternate: knee across body    Gastroc Stretch  Both;1 rep;60 seconds  Gastroc Stretch Limitations  on incline      Knee/Hip Exercises: Aerobic   Elliptical  L2 x 3 min   reported pain in Right lateral leg after 2 min     Knee/Hip Exercises: Machines for Strengthening   Cybex Knee Extension  2 plates    1x5 bil with ecc lowering; 1x 5 bil; twitching in Rt quad     Knee/Hip Exercises: Sidelying   Hip ABduction  Both;2 sets;10 reps      Knee/Hip Exercises: Prone   Hip Extension  Both;2 sets;10 reps               PT Short Term Goals - 01/16/19 1256      PT SHORT TERM GOAL #1   Title  Pt will be independent in his HEP.    Status  On-going        PT Long Term Goals - 01/20/19 1050      PT LONG TERM GOAL #1   Title  Pt will be independent in HEP and progression.    Status  On-going      PT LONG TERM GOAL #2   Title  Pt will improve his bilateral hamstring flexibilty to >/= 75 degrees in order to improve functional mobility.    Baseline  R 60 deg; L 57 deg    Status  On-going      PT LONG TERM GOAL #3   Title  Patient able to  sleep without waking from leg pain    Status  Revised      PT LONG TERM GOAL #4   Title  Patient able to stand for one hour without pain    Status  Revised      PT LONG TERM GOAL #5   Title  Pt will improve his L LE strength to 5/5 in order to improve functional mobility and gait.    Status  On-going            Plan - 01/20/19 1056    Clinical Impression Statement  Patient is progressing toward goals. Some LTGs were revised to meet pt goals. He still has pain in left lateral thigh and felt the same pain in the right with elliptical. No hip pain reported today. He did well with strengthening in BLE today altough patient was fatigued in general from working two jobs.    Comorbidities  GERD, migraines, OSA, fatty liver, kidney stones, back pain, colitis, bilateral inguinal hernia, HTN    PT Frequency  2x / week    PT Duration  6 weeks    PT Treatment/Interventions  Cryotherapy;Ultrasound;Traction;Moist Heat;Iontophoresis '4mg'$ /ml Dexamethasone;Electrical Stimulation;Gait training;Stair training;Balance training;Therapeutic exercise;Therapeutic activities;Functional mobility training;Neuromuscular re-education;Patient/family education;Manual techniques;Dry needling;Passive range of motion;Taping    PT Next Visit Plan  Focus on core and LLE strengthening; DN prn    PT Home Exercise Plan  Access Code: [email or web address]    Consulted and Agree with Plan of Care  Patient       Patient will benefit from skilled therapeutic intervention in order to improve the following deficits and impairments:  Pain, Decreased strength, Impaired flexibility, Decreased range of motion, Difficulty walking  Visit Diagnosis: Muscle weakness (generalized)     Problem List Patient Active Problem List   Diagnosis Date Noted  . Left hip pain 12/25/2018  . Epigastric pain 07/14/2018  . Chronic left-sided low back pain without sciatica 07/14/2018  . History of diverticulitis 04/22/2018  . Calculus of gallbladder  without cholecystitis without obstruction 04/22/2018  .  Non-recurrent bilateral inguinal hernia without obstruction or gangrene 03/17/2018  . Umbilical hernia without obstruction and without gangrene 03/17/2018  . History of Clostridium difficile colitis 05/09/2015  . Herniated nucleus pulposus, thoracic 01/18/2014  . Eosinophilic esophagitis 04/13/4994  . Obesity 02/16/2011  . Controlled type 2 diabetes mellitus without complication, without long-term current use of insulin ([place]) 02/07/2011  . OSA (obstructive sleep apnea) 11/16/2009  . ESSENTIAL HYPERTENSION, BENIGN 11/16/2009  . BACK PAIN, THORACIC REGION 11/24/2007  . DIVERTICULOSIS OF COLON 06/25/2007  . Fatty liver disease, nonalcoholic 92/49/3241  . GAD (generalized anxiety disorder) 11/20/2005  . MIGRAINE, UNSPEC., W/O INTRACTABLE MIGRAINE 11/20/2005  . Gastroesophageal reflux disease with esophagitis 11/20/2005  . KIDNEY STONE - NEPHROLITHIASIS 11/20/2005    Madelyn Flavors PT 01/20/2019, 12:18 PM  [facility] [place], [place], [address] Phone: [phone]   Fax:  [phone]  Name: Huzaifa Viney MRN: [id number] Date of Birth: [birth year]  PHYSICAL THERAPY DISCHARGE SUMMARY  Visits from Start of Care: 5  Current functional level related to goals / functional outcomes: See above   Remaining deficits: See above   Education / Equipment: HEP Plan: Patient agrees to discharge.  Patient goals were not met. Patient is being discharged due to not returning since the last visit.  ?????     Madelyn Flavors, PT 03/03/19 12:41 PM  [facility] [place], [place] [address]  [phone] (office) [phone] (fax)

## 2019-01-22 ENCOUNTER — Ambulatory Visit (INDEPENDENT_AMBULATORY_CARE_PROVIDER_SITE_OTHER): Payer: BC Managed Care – PPO | Admitting: Sports Medicine

## 2019-01-22 ENCOUNTER — Encounter: Payer: Self-pay | Admitting: Sports Medicine

## 2019-01-22 ENCOUNTER — Other Ambulatory Visit: Payer: Self-pay

## 2019-01-22 DIAGNOSIS — Z9889 Other specified postprocedural states: Secondary | ICD-10-CM | POA: Diagnosis not present

## 2019-01-22 DIAGNOSIS — M25552 Pain in left hip: Secondary | ICD-10-CM | POA: Diagnosis not present

## 2019-01-22 NOTE — Progress Notes (Signed)
Subjective:    CC: Follow-up  HPI: Nicholas Christian returns, he is a pleasant 53 year old male, we have been managing his left hip pain, x-rays did show some osteoarthritis.  He has had physical therapy, meloxicam, really not a whole lot better.  Pain is moderate, persistent, localized in the left and right groin, left worse than right.  Worse with prolonged weightbearing.  I reviewed the past medical history, family history, social history, surgical history, and allergies today and no changes were needed.  Please see the problem list section below in epic for further details.  Past Medical History: Past Medical History:  Diagnosis Date  . Complication of anesthesia    low O2 sat after kidney surgery ~ 2009  . DDD (degenerative disc disease), cervical   . Diabetes mellitus without complication ([place])   . Diverticulitis   . Eosinophilic esophagitis    hx of- [facility]  . GERD (gastroesophageal reflux disease)   . High triglycerides   . History of hiatal hernia   . History of kidney stones    hx  . Low HDL (under 40)   . Low testosterone   . Migraines   . Normal cardiac stress test    DOBUTAMINE STRESS ECHO 10 years ago   . OSA (obstructive sleep apnea)    no cpap used  . Pneumonia 10/15   no problems now   Past Surgical History: Past Surgical History:  Procedure Laterality Date  . EDG  01/03/12  . ESOPHAGEAL DILATION  07/18/11   Dr. Tora Duck  . KIDNEY STONE SURGERY     laser at [place] MICRODISCECTOMY Left 01/18/2014   Procedure: Left Thoracic five-six microdiskectomy;  Surgeon: Newman Pies, MD;  Location: [facility];  Service: Neurosurgery;  Laterality: Left;  . NASAL SINUS SURGERY     Removal polyps   . TONSILLECTOMY AND ADENOIDECTOMY    . TYMPANOSTOMY TUBE PLACEMENT     Social History: Social History   Socioeconomic History  . Marital status: Married    Spouse name: Not on file  . Number of children: 3  . Years of  education: Not on file  . Highest education level: Not on file  Occupational History  . 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   Tobacco Use  . Smoking status: Never Smoker  . Smokeless tobacco: Never Used  Substance and Sexual Activity  . Alcohol use: No  . Drug use: No  . Sexual activity: Yes    Partners: Female  Other Topics Concern  . Not on file  Social History Narrative   No regular exercise. 1 caffeine /soda per day.    Social Determinants of Health   Financial Resource Strain:   . Difficulty of Paying Living Expenses: Not on file  Food Insecurity:   . Worried About Charity fundraiser in the Last Year: Not on file  . Ran Out of Food in the Last Year: Not on file  Transportation Needs:   . Lack of Transportation (Medical): Not on file  . Lack of Transportation (Non-Medical): Not on file  Physical Activity:   . Days of Exercise per Week: Not on file  . Minutes of Exercise per Session: Not on file  Stress:   . Feeling of Stress : Not on file  Social Connections:   . Frequency of Communication with Friends and Family: Not on file  . Frequency of Social Gatherings with Friends and Family: Not on  file  . Attends Religious Services: Not on file  . Active Member of Clubs or Organizations: Not on file  . Attends Archivist Meetings: Not on file  . Marital Status: Not on file   Family History: Family History  Problem Relation Age of Onset  . Prostate cancer Father        oldr when dx   . Diabetes Maternal Grandmother    Allergies: Allergies  Allergen Reactions  . Niacin Other (See Comments)    Reaction not recalled  . Onglyza [Saxagliptin] Nausea Only   Medications: See med rec.  Review of Systems: No fevers, chills, night sweats, weight loss, chest pain, or shortness of breath.   Objective:    General: Well Developed, well nourished, and in no acute distress.  Neuro: Alert and oriented x3, extra-ocular muscles intact,  sensation grossly intact.  HEENT: Normocephalic, atraumatic, pupils equal round reactive to light, neck supple, no masses, no lymphadenopathy, thyroid nonpalpable.  Skin: Warm and dry, no rashes. Cardiac: Regular rate and rhythm, no murmurs rubs or gallops, no lower extremity edema.  Respiratory: Clear to auscultation bilaterally. Not using accessory muscles, speaking in full sentences. Left hip: ROM IR: 45 degrees with pain, ER: 60 Deg, Flexion: 120 Deg, Extension: 100 Deg, Abduction: 45 Deg, Adduction: 45 Deg Strength IR: 5/5, ER: 5/5, Flexion: 5/5, Extension: 5/5, Abduction: 5/5, Adduction: 5/5 Pelvic alignment unremarkable to inspection and palpation. Standing hip rotation and gait without trendelenburg / unsteadiness. Greater trochanter without tenderness to palpation. No tenderness over piriformis. No SI joint tenderness and normal minimal SI movement.  Procedure: Real-time Ultrasound Guided injection of the left hip joint Device: Samsung HS60  Verbal informed consent obtained.  Time-out conducted.  Noted no overlying erythema, induration, or other signs of local infection.  Skin prepped in a sterile fashion.  Local anesthesia: Topical Ethyl chloride.  With sterile technique and under real time ultrasound guidance: 1 cc Kenalog 40, 2 cc lidocaine, 2 cc bupivacaine injected easily Completed without difficulty  Pain immediately resolved suggesting accurate placement of the medication.  Advised to call if fevers/chills, erythema, induration, drainage, or persistent bleeding.  Images permanently stored and available for review in the ultrasound unit.  Impression: Technically successful ultrasound guided injection.  Impression and Recommendations:    Left hip pain Pain did declare itself as hip joint, x-rays did show osteoarthritis and his pain is reproducible today with internal rotation. Because he only has partial improvement with meloxicam, and aggressive physical therapy we  are going to inject his left hip today, return in 1 month. He did gain some weight but I think meralgia paresthetica is less likely at this point.   ___________________________________________ Gwen Her. Dianah Field, M.D., ABFM., CAQSM. Primary Care and Sports Medicine [facility]  Adjunct Professor of [place] [employer]

## 2019-01-22 NOTE — Assessment & Plan Note (Signed)
Pain did declare itself as hip joint, x-rays did show osteoarthritis and his pain is reproducible today with internal rotation. Because he only has partial improvement with meloxicam, and aggressive physical therapy we are going to inject his left hip today, return in 1 month. He did gain some weight but I think meralgia paresthetica is less likely at this point.

## 2019-01-23 ENCOUNTER — Encounter: Payer: BC Managed Care – PPO | Admitting: Physical Therapy

## 2019-01-26 ENCOUNTER — Other Ambulatory Visit: Payer: Self-pay | Admitting: Family Medicine

## 2019-01-27 ENCOUNTER — Encounter: Payer: BC Managed Care – PPO | Admitting: Physical Therapy

## 2019-01-30 ENCOUNTER — Encounter: Payer: BC Managed Care – PPO | Admitting: Physical Therapy

## 2019-02-03 ENCOUNTER — Encounter: Payer: BC Managed Care – PPO | Admitting: Rehabilitative and Restorative Service Providers"

## 2019-02-19 ENCOUNTER — Other Ambulatory Visit: Payer: Self-pay

## 2019-02-19 ENCOUNTER — Ambulatory Visit (INDEPENDENT_AMBULATORY_CARE_PROVIDER_SITE_OTHER): Payer: BC Managed Care – PPO | Admitting: Sports Medicine

## 2019-02-19 DIAGNOSIS — M1612 Unilateral primary osteoarthritis, left hip: Secondary | ICD-10-CM

## 2019-02-19 NOTE — Progress Notes (Signed)
    Procedures performed today:    None.  Independent interpretation of tests performed by another provider:   None.  Impression and Recommendations:    Primary osteoarthritis of left hip I saw Nicholas Christian a month ago, we injected his left hip joint after failure of conservative treatment. Doing much better after hip joint injection, continue meloxicam, may add arthritis strength Tylenol over-the-counter if he desires. Otherwise he can see me as needed.    ___________________________________________ Gwen Her. Dianah Field, M.D., ABFM., CAQSM. Primary Care and [place] Instructor of [place] [employer] [employer]

## 2019-02-19 NOTE — Assessment & Plan Note (Signed)
I saw Nicholas Christian a month ago, we injected his left hip joint after failure of conservative treatment. Doing much better after hip joint injection, continue meloxicam, may add arthritis strength Tylenol over-the-counter if he desires. Otherwise he can see me as needed.

## 2019-03-17 ENCOUNTER — Other Ambulatory Visit: Payer: Self-pay | Admitting: Family Medicine

## 2019-03-22 ENCOUNTER — Encounter: Payer: Self-pay | Admitting: Family Medicine

## 2019-03-23 ENCOUNTER — Telehealth: Payer: Self-pay | Admitting: Family Medicine

## 2019-03-23 MED ORDER — JANUMET XR 50-1000 MG PO TB24: 1.0000 | ORAL_TABLET | Freq: Every day | ORAL | 0 refills | Status: DC

## 2019-03-23 NOTE — Addendum Note (Signed)
Addended by: Towana Badger on: 03/23/2019 11:49 AM   Modules accepted: Orders

## 2019-03-23 NOTE — Telephone Encounter (Signed)
Message from plan: (Janumet) CaseId:[id number];Status:Approved;Review Type:Prior Auth;Coverage Start Date:02/21/2019;Coverage End Date:03/22/2020; Pharmacy aware.

## 2019-03-23 NOTE — Telephone Encounter (Signed)
Removed Kombiglyze from med list.   Pended Janumet which patient was on previously [id number])  Please review and send if appropriate

## 2019-04-15 LAB — HM DIABETES EYE EXAM

## 2019-04-22 ENCOUNTER — Encounter: Payer: Self-pay | Admitting: Family Medicine

## 2019-04-22 ENCOUNTER — Other Ambulatory Visit: Payer: Self-pay

## 2019-04-22 ENCOUNTER — Ambulatory Visit (INDEPENDENT_AMBULATORY_CARE_PROVIDER_SITE_OTHER): Payer: BC Managed Care – PPO | Admitting: Family Medicine

## 2019-04-22 ENCOUNTER — Ambulatory Visit: Payer: BC Managed Care – PPO | Admitting: Family Medicine

## 2019-04-22 VITALS — BP 121/79 | HR 82 | Ht 70.0 in | Wt 238.0 lb

## 2019-04-22 DIAGNOSIS — E119 Type 2 diabetes mellitus without complications: Secondary | ICD-10-CM | POA: Diagnosis not present

## 2019-04-22 DIAGNOSIS — I10 Essential (primary) hypertension: Secondary | ICD-10-CM | POA: Diagnosis not present

## 2019-04-22 LAB — BASIC METABOLIC PANEL WITH GFR
BUN/Creatinine Ratio: 23 (calc) — ABNORMAL HIGH (ref 6–22)
BUN: 26 mg/dL — ABNORMAL HIGH (ref 7–25)
CO2: 27 mmol/L (ref 20–32)
Calcium: 9.2 mg/dL (ref 8.6–10.3)
Chloride: 108 mmol/L (ref 98–110)
Creat: 1.12 mg/dL (ref 0.70–1.33)
GFR, Est African American: 86 mL/min/{1.73_m2} (ref >=60)
GFR, Est Non African American: 75 mL/min/{1.73_m2} (ref >=60)
Glucose, Bld: 124 mg/dL — ABNORMAL HIGH (ref 65–99)
Potassium: 4.1 mmol/L (ref 3.5–5.3)
Sodium: 142 mmol/L (ref 135–146)

## 2019-04-22 LAB — POCT GLYCOSYLATED HEMOGLOBIN (HGB A1C): Hemoglobin A1C: 6.2 % — AB (ref 4.0–5.6)

## 2019-04-22 NOTE — Assessment & Plan Note (Signed)
Well controlled. Continue current regimen. Follow up in  4 mo 

## 2019-04-22 NOTE — Assessment & Plan Note (Addendum)
Doing well overall. Feeling better on the Janumet.  A1C looks great!  ON statin and ACEi.

## 2019-04-22 NOTE — Progress Notes (Signed)
Established Patient Office Visit  Subjective:    Patient ID: Nicholas Christian, male    DOB: 12-30-1965  Age: 54 y.o. MRN: [id number]  CC:  Chief Complaint  Patient presents with  . Diabetes    HPI Nicholas Christian presents for   Diabetes - no hypoglycemic events. No wounds or sores that are not healing well. No increased thirst or urination. Rarely checking glucose at home. Taking medications as prescribed without any side effects.  He is currently on Janumet and Trulicity.  I did change to Janumet because of his insurance but says he actually feels better on this compared to the [place].  Hypertension- Pt denies chest pain, SOB, dizziness, or heart palpitations.  Taking meds as directed w/o problems.  Denies medication side effects.      Past Medical History:  Diagnosis Date  . Complication of anesthesia    low O2 sat after kidney surgery ~ 2009  . DDD (degenerative disc disease), cervical   . Diabetes mellitus without complication ([place])   . Diverticulitis   . Eosinophilic esophagitis    hx of- [facility]  . GERD (gastroesophageal reflux disease)   . High triglycerides   . History of hiatal hernia   . History of kidney stones    hx  . Low HDL (under 40)   . Low testosterone   . Migraines   . Normal cardiac stress test    DOBUTAMINE STRESS ECHO 10 years ago   . OSA (obstructive sleep apnea)    no cpap used  . Pneumonia 10/15   no problems now    Past Surgical History:  Procedure Laterality Date  . EDG  01/03/12  . ESOPHAGEAL DILATION  07/18/11   Dr. Tora Duck  . KIDNEY STONE SURGERY     laser at [facility] MICRODISCECTOMY Left 01/18/2014   Procedure: Left Thoracic five-six microdiskectomy;  Surgeon: Newman Pies, MD;  Location: [facility];  Service: Neurosurgery;  Laterality: Left;  . NASAL SINUS SURGERY     Removal polyps   . TONSILLECTOMY AND ADENOIDECTOMY    . TYMPANOSTOMY TUBE PLACEMENT      Family  History  Problem Relation Age of Onset  . Prostate cancer Father        oldr when dx   . Diabetes Maternal Grandmother     Social History   Socioeconomic History  . Marital status: Married    Spouse name: Not on file  . Number of children: 3  . Years of education: Not on file  . Highest education level: Not on file  Occupational History  . 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   Tobacco Use  . Smoking status: Never Smoker  . Smokeless tobacco: Never Used  Substance and Sexual Activity  . Alcohol use: No  . Drug use: No  . Sexual activity: Yes    Partners: Female  Other Topics Concern  . Not on file  Social History Narrative   No regular exercise. 1 caffeine /soda per day.    Social Determinants of Health   Financial Resource Strain:   . Difficulty of Paying Living Expenses: Not on file  Food Insecurity:   . Worried About Charity fundraiser in the Last Year: Not on file  . Ran Out of Food in the Last Year: Not on file  Transportation Needs:   . Lack of Transportation (Medical): Not on file  . Lack of  Transportation (Non-Medical): Not on file  Physical Activity:   . Days of Exercise per Week: Not on file  . Minutes of Exercise per Session: Not on file  Stress:   . Feeling of Stress : Not on file  Social Connections:   . Frequency of Communication with Friends and Family: Not on file  . Frequency of Social Gatherings with Friends and Family: Not on file  . Attends Religious Services: Not on file  . Active Member of Clubs or Organizations: Not on file  . Attends Archivist Meetings: Not on file  . Marital Status: Not on file  Intimate Partner Violence:   . Fear of Current or Ex-Partner: Not on file  . Emotionally Abused: Not on file  . Physically Abused: Not on file  . Sexually Abused: Not on file    Outpatient Medications Prior to Visit  Medication Sig Dispense Refill  . AMBULATORY NON FORMULARY MEDICATION Medication Name:  Glucometer and strips and lancet to check once a day.  Dx 250.00 1 Units 11  . atorvastatin (LIPITOR) 20 MG tablet Take 1 tablet (20 mg total) by mouth daily. 90 tablet 3  . cholecalciferol (VITAMIN D3) 25 MCG (1000 UT) tablet Take 1,000 Units by mouth daily.    . Insulin Pen Needle (PEN NEEDLES) 31G X 8 MM MISC Use for administraiton of Victoza daily. 100 each 2  . lisinopril (ZESTRIL) 10 MG tablet Take 1 tablet (10 mg total) by mouth daily. 90 tablet 1  . meloxicam (MOBIC) 15 MG tablet One tab PO qAM with breakfast for 2 weeks, then daily prn pain. 30 tablet 3  . omeprazole (PRILOSEC) 40 MG capsule Take 1 capsule by mouth.    . SitaGLIPtin-MetFORMIN HCl (JANUMET XR) 50-1000 MG TB24 Take 1 tablet by mouth daily. 90 tablet 0  . TRULICITY 1.5 0000000 SOPN INJECT 1.5 MG INTO THE SKIN EVERY 7 DAYS 4 mL 3   No facility-administered medications prior to visit.    Allergies  Allergen Reactions  . Niacin Other (See Comments)    Reaction not recalled  . Onglyza [Saxagliptin] Nausea Only    ROS Review of Systems    Objective:    Physical Exam  Constitutional: He is oriented to person, place, and time. He appears well-developed and well-nourished.  HENT:  Head: Normocephalic and atraumatic.  Eyes: Conjunctivae are normal.  Cardiovascular: Normal rate, regular rhythm and normal heart sounds.  Pulmonary/Chest: Effort normal and breath sounds normal.  Neurological: He is alert and oriented to person, place, and time.  Skin: Skin is warm and dry.  Psychiatric: He has a normal mood and affect. His behavior is normal.    BP 121/79   Pulse 82   Ht 5\' 10"  (1.778 m)   Wt 238 lb (108 kg)   SpO2 97%   BMI 34.15 kg/m  Wt Readings from Last 3 Encounters:  04/22/19 238 lb (108 kg)  01/22/19 233 lb 1.3 oz (105.7 kg)  12/25/18 232 lb (105.2 kg)     There are no preventive care reminders to display for this patient.  There are no preventive care reminders to display for this  patient.  Lab Results  Component Value Date   TSH 1.80 12/23/2018   Lab Results  Component Value Date   WBC 6.7 06/16/2018   HGB 16.0 06/16/2018   HCT 47.2 06/16/2018   MCV 78.9 (L) 06/16/2018   PLT 199 06/16/2018   Lab Results  Component Value Date   NA  141 06/16/2018   K 4.5 06/16/2018   CO2 31 06/16/2018   GLUCOSE 116 (H) 06/16/2018   BUN 12 06/16/2018   CREATININE 1.16 06/16/2018   BILITOT 0.6 06/16/2018   ALKPHOS 50 04/22/2018   AST 25 06/16/2018   ALT 43 06/16/2018   PROT 7.1 06/16/2018   ALBUMIN 3.4 (L) 04/22/2018   CALCIUM 9.7 06/16/2018   ANIONGAP 9 04/22/2018   Lab Results  Component Value Date   CHOL 117 06/16/2018   Lab Results  Component Value Date   HDL 36 (L) 06/16/2018   Lab Results  Component Value Date   LDLCALC 58 06/16/2018   Lab Results  Component Value Date   TRIG 148 06/16/2018   Lab Results  Component Value Date   CHOLHDL 3.3 06/16/2018   Lab Results  Component Value Date   HGBA1C 6.2 (A) 04/22/2019      Assessment & Plan:   Problem List Items Addressed This Visit      Cardiovascular and Mediastinum   ESSENTIAL HYPERTENSION, BENIGN    Well controlled. Continue current regimen. Follow up in  4 mo      Relevant Orders   BASIC METABOLIC PANEL WITH GFR     Endocrine   Controlled type 2 diabetes mellitus without complication, without long-term current use of insulin ([place]) - Primary    Doing well overall. Feeling better on the Janumet.  A1C looks great!  ON statin and ACEi.       Relevant Orders   POCT glycosylated hemoglobin (Hb A1C) (Completed)   BASIC METABOLIC PANEL WITH GFR      No orders of the defined types were placed in this encounter.   Follow-up: Return in about 4 months (around 08/22/2019) for Diabetes follow-up, Hypertension.    Beatrice Lecher, MD

## 2019-04-23 ENCOUNTER — Encounter: Payer: Self-pay | Admitting: Family Medicine

## 2019-04-28 ENCOUNTER — Other Ambulatory Visit: Payer: Self-pay | Admitting: Sports Medicine

## 2019-04-28 DIAGNOSIS — M25552 Pain in left hip: Secondary | ICD-10-CM

## 2019-04-28 MED ORDER — MELOXICAM 15 MG PO TABS: ORAL_TABLET | ORAL | 3 refills | Status: DC

## 2019-04-29 ENCOUNTER — Other Ambulatory Visit: Payer: Self-pay | Admitting: *Deleted

## 2019-04-29 MED ORDER — JANUMET XR 50-1000 MG PO TB24: 1.0000 | ORAL_TABLET | Freq: Every day | ORAL | 1 refills | Status: DC

## 2019-04-29 MED ORDER — TRULICITY 1.5 MG/0.5ML ~~LOC~~ SOAJ: SUBCUTANEOUS | 3 refills | Status: DC

## 2019-05-11 ENCOUNTER — Other Ambulatory Visit: Payer: Self-pay | Admitting: *Deleted

## 2019-05-11 DIAGNOSIS — E119 Type 2 diabetes mellitus without complications: Secondary | ICD-10-CM

## 2019-05-11 MED ORDER — TRULICITY 1.5 MG/0.5ML ~~LOC~~ SOAJ: SUBCUTANEOUS | 3 refills | Status: DC

## 2019-05-11 MED ORDER — JANUMET XR 50-1000 MG PO TB24: 1.0000 | ORAL_TABLET | Freq: Every day | ORAL | 1 refills | Status: DC

## 2019-05-18 ENCOUNTER — Other Ambulatory Visit: Payer: Self-pay | Admitting: Sports Medicine

## 2019-05-18 DIAGNOSIS — M25552 Pain in left hip: Secondary | ICD-10-CM

## 2019-05-18 MED ORDER — MELOXICAM 15 MG PO TABS: 15.0000 mg | ORAL_TABLET | Freq: Every day | ORAL | 3 refills | Status: DC

## 2019-07-30 ENCOUNTER — Encounter: Payer: Self-pay | Admitting: Family Medicine

## 2019-08-05 ENCOUNTER — Ambulatory Visit (INDEPENDENT_AMBULATORY_CARE_PROVIDER_SITE_OTHER): Payer: BC Managed Care – PPO | Admitting: Family Medicine

## 2019-08-05 VITALS — Temp 98.0°F

## 2019-08-05 DIAGNOSIS — Z20822 Contact with and (suspected) exposure to covid-19: Secondary | ICD-10-CM

## 2019-08-05 NOTE — Progress Notes (Signed)
Pt here for COVID testing for travel.  Pt instructed on how to perform self swab.Self swab performed

## 2019-08-06 LAB — SARS-COV-2, NAA 2 DAY TAT

## 2019-08-06 LAB — NOVEL CORONAVIRUS, NAA: SARS-CoV-2, NAA: NOT DETECTED

## 2019-08-06 NOTE — Progress Notes (Signed)
Agree with documentation as above.   Aleph Nickson, MD  

## 2019-08-24 ENCOUNTER — Ambulatory Visit: Payer: BC Managed Care – PPO | Admitting: Family Medicine

## 2019-08-27 ENCOUNTER — Other Ambulatory Visit: Payer: Self-pay

## 2019-08-27 ENCOUNTER — Ambulatory Visit (INDEPENDENT_AMBULATORY_CARE_PROVIDER_SITE_OTHER): Payer: BC Managed Care – PPO | Admitting: Family Medicine

## 2019-08-27 ENCOUNTER — Encounter: Payer: Self-pay | Admitting: Family Medicine

## 2019-08-27 VITALS — BP 121/81 | HR 98 | Ht 70.0 in | Wt 234.0 lb

## 2019-08-27 DIAGNOSIS — E519 Thiamine deficiency, unspecified: Secondary | ICD-10-CM | POA: Diagnosis not present

## 2019-08-27 DIAGNOSIS — E119 Type 2 diabetes mellitus without complications: Secondary | ICD-10-CM | POA: Diagnosis not present

## 2019-08-27 DIAGNOSIS — Z125 Encounter for screening for malignant neoplasm of prostate: Secondary | ICD-10-CM

## 2019-08-27 DIAGNOSIS — I10 Essential (primary) hypertension: Secondary | ICD-10-CM | POA: Diagnosis not present

## 2019-08-27 DIAGNOSIS — F411 Generalized anxiety disorder: Secondary | ICD-10-CM

## 2019-08-27 DIAGNOSIS — R41840 Attention and concentration deficit: Secondary | ICD-10-CM

## 2019-08-27 LAB — POCT GLYCOSYLATED HEMOGLOBIN (HGB A1C): Hemoglobin A1C: 7 % — AB (ref 4.0–5.6)

## 2019-08-27 MED ORDER — KOMBIGLYZE XR 5-1000 MG PO TB24: 1.0000 | ORAL_TABLET | Freq: Every day | ORAL | 1 refills | Status: DC

## 2019-08-27 NOTE — Progress Notes (Signed)
Established Patient Office Visit  Subjective:  Patient ID: Nicholas Christian, male    DOB: 1965/10/04  Age: 54 y.o. MRN: [id number]  CC:  Chief Complaint  Patient presents with  . Diabetes  . Hypertension    HPI Nicholas Christian presents for   Diabetes - no hypoglycemic events. No wounds or sores that are not healing well. No increased thirst or urination. Checking glucose at home.  He was getting nausea and sulfurous burps on the Janumet and Trulicity so he just stopped both of them.  He then restarted his old Kappa.  He had been on that previously and tolerated it pretty well.  Had to be changed because of his insurance plan.  He says since restarting the Kombiglyze he has felt fine and symptoms have resolved he is yet to restart the Trulicity but plans on doing it this week.  He did well let me know that he is been going to [facility] for weight loss again he had the hormonal pellets placed.  He has not noticed a big difference or improvement yet.  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  He also wanted to let me know that he decided to do behavioral health consultation with a therapist through work.  He says it is actually been helpful.  One of his biggest concerns is feeling very distracted and not finishing tasks.  He feels like it is become really disruptive.  He has felt down as well and sometimes anxious.  He was recently started on 10 mg of Adderall in the morning and then they added and 10 mg of IR in the afternoon.  He feels like it helps some but is not a significant improvement and he definitely feels it wearing off before 5:00.  Hypertension- Pt denies chest pain, SOB, dizziness, or heart palpitations.  Taking meds as directed w/o problems.  Denies medication side effects.      Past Medical History:  Diagnosis Date  . Complication of anesthesia    low O2 sat after kidney surgery ~ 2009  . DDD (degenerative disc disease), cervical   . Diabetes  mellitus without complication ([place])   . Diverticulitis   . Eosinophilic esophagitis    hx of- [facility]  . GERD (gastroesophageal reflux disease)   . High triglycerides   . History of hiatal hernia   . History of kidney stones    hx  . Low HDL (under 40)   . Low testosterone   . Migraines   . Normal cardiac stress test    DOBUTAMINE STRESS ECHO 10 years ago   . OSA (obstructive sleep apnea)    no cpap used  . Pneumonia 10/15   no problems now    Past Surgical History:  Procedure Laterality Date  . EDG  01/03/12  . ESOPHAGEAL DILATION  07/18/11   Dr. Tora Duck  . KIDNEY STONE SURGERY     laser at [place] MICRODISCECTOMY Left 01/18/2014   Procedure: Left Thoracic five-six microdiskectomy;  Surgeon: Newman Pies, MD;  Location: [facility];  Service: Neurosurgery;  Laterality: Left;  . NASAL SINUS SURGERY     Removal polyps   . TONSILLECTOMY AND ADENOIDECTOMY    . TYMPANOSTOMY TUBE PLACEMENT      Family History  Problem Relation Age of Onset  . Prostate cancer Father        oldr when dx   . Diabetes Maternal Grandmother  Social History   Socioeconomic History  . Marital status: Married    Spouse name: Not on file  . Number of children: 3  . Years of education: Not on file  . Highest education level: Not on file  Occupational History  . 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   Tobacco Use  . Smoking status: Never Smoker  . Smokeless tobacco: Never Used  Vaping Use  . Vaping Use: Never used  Substance and Sexual Activity  . Alcohol use: No  . Drug use: No  . Sexual activity: Yes    Partners: Female  Other Topics Concern  . Not on file  Social History Narrative   No regular exercise. 1 caffeine /soda per day.    Social Determinants of Health   Financial Resource Strain:   . Difficulty of Paying Living Expenses:   Food Insecurity:   . Worried About Charity fundraiser in the Last  Year:   . Arboriculturist in the Last Year:   Transportation Needs:   . Film/video editor (Medical):   Marland Kitchen Lack of Transportation (Non-Medical):   Physical Activity:   . Days of Exercise per Week:   . Minutes of Exercise per Session:   Stress:   . Feeling of Stress :   Social Connections:   . Frequency of Communication with Friends and Family:   . Frequency of Social Gatherings with Friends and Family:   . Attends Religious Services:   . Active Member of Clubs or Organizations:   . Attends Archivist Meetings:   Marland Kitchen Marital Status:   Intimate Partner Violence:   . Fear of Current or Ex-Partner:   . Emotionally Abused:   Marland Kitchen Physically Abused:   . Sexually Abused:     Outpatient Medications Prior to Visit  Medication Sig Dispense Refill  . AMBULATORY NON FORMULARY MEDICATION Medication Name: Glucometer and strips and lancet to check once a day.  Dx 250.00 1 Units 11  . amphetamine-dextroamphetamine (ADDERALL XR) 20 MG 24 hr capsule Take 20 mg by mouth daily.    Marland Kitchen amphetamine-dextroamphetamine (ADDERALL) 10 MG tablet Take 10 mg by mouth daily.    Marland Kitchen atorvastatin (LIPITOR) 20 MG tablet Take 1 tablet (20 mg total) by mouth daily. 90 tablet 3  . cholecalciferol (VITAMIN D3) 25 MCG (1000 UT) tablet Take 1,000 Units by mouth daily.    . Dulaglutide (TRULICITY) 1.5 GU/5.4YH SOPN INJECT 1.5 MG INTO THE SKIN EVERY 7 DAYS 4 mL 3  . Insulin Pen Needle (PEN NEEDLES) 31G X 8 MM MISC Use for administraiton of Victoza daily. 100 each 2  . lisinopril (ZESTRIL) 10 MG tablet Take 1 tablet (10 mg total) by mouth daily. 90 tablet 1  . omeprazole (PRILOSEC) 40 MG capsule Take 1 capsule by mouth.    . SitaGLIPtin-MetFORMIN HCl (JANUMET XR) 50-1000 MG TB24 Take 1 tablet by mouth daily. 90 tablet 1  . meloxicam (MOBIC) 15 MG tablet Take 1 tablet (15 mg total) by mouth daily. 90 tablet 3   No facility-administered medications prior to visit.    Allergies  Allergen Reactions  . Januvia  [Sitagliptin] Other (See Comments)    sulfurous burps and nausea.   . Niacin Other (See Comments)    Reaction not recalled    ROS Review of Systems    Objective:    Physical Exam  BP 121/81   Pulse 98   Ht 5\' 10"  (1.778 m)  Wt 234 lb (106.1 kg)   SpO2 99%   BMI 33.58 kg/m  Wt Readings from Last 3 Encounters:  08/27/19 234 lb (106.1 kg)  04/22/19 238 lb (108 kg)  01/22/19 233 lb 1.3 oz (105.7 kg)     Health Maintenance Due  Topic Date Due  . Hepatitis C Screening  Never done    There are no preventive care reminders to display for this patient.  Lab Results  Component Value Date   TSH 1.80 12/23/2018   Lab Results  Component Value Date   WBC 6.7 06/16/2018   HGB 16.0 06/16/2018   HCT 47.2 06/16/2018   MCV 78.9 (L) 06/16/2018   PLT 199 06/16/2018   Lab Results  Component Value Date   NA 142 04/22/2019   K 4.1 04/22/2019   CO2 27 04/22/2019   GLUCOSE 124 (H) 04/22/2019   BUN 26 (H) 04/22/2019   CREATININE 1.12 04/22/2019   BILITOT 0.6 06/16/2018   ALKPHOS 50 04/22/2018   AST 25 06/16/2018   ALT 43 06/16/2018   PROT 7.1 06/16/2018   ALBUMIN 3.4 (L) 04/22/2018   CALCIUM 9.2 04/22/2019   ANIONGAP 9 04/22/2018   Lab Results  Component Value Date   CHOL 117 06/16/2018   Lab Results  Component Value Date   HDL 36 (L) 06/16/2018   Lab Results  Component Value Date   LDLCALC 58 06/16/2018   Lab Results  Component Value Date   TRIG 148 06/16/2018   Lab Results  Component Value Date   CHOLHDL 3.3 06/16/2018   Lab Results  Component Value Date   HGBA1C 7.0 (A) 08/27/2019      Assessment & Plan:   Problem List Items Addressed This Visit      Cardiovascular and Mediastinum   ESSENTIAL HYPERTENSION, BENIGN    Blood pressure is at goal.  He has recently started Adderall and it looks like his blood pressure is still normal.      Relevant Orders   POCT glycosylated hemoglobin (Hb A1C) (Completed)   Lipid Panel w/reflex Direct LDL      Endocrine   Controlled type 2 diabetes mellitus without complication, without long-term current use of insulin ([place]) - Primary    Uncontrolled.  Hemoglobin A1c 7.0 today up from previous of 6.2.  He also recently on vacation.  Discussed getting back on track and at least retrying the Trulicity.  It can cause nausea so if he feels like that is not resolving after week then please let us know.  Also reminded him that he has to start at 0.75 he can restart at 1.5 mg.  So he will need to let me know if he needs a new prescription.  I did go ahead and switch the medication to [facility] since he is been tolerating that well.  Provided coupon card.  It may require prior authorization with the insurance I am not sure.      Relevant Medications   Saxagliptin-Metformin (KOMBIGLYZE XR) 06-998 MG TB24   Other Relevant Orders   POCT glycosylated hemoglobin (Hb A1C) (Completed)   Lipid Panel w/reflex Direct LDL     Other   Inattention    Recently started on Adderall via psychiatry.  They are still adjusting his medication and dosing.  We discussed in general how Adderall works and that he is not going to be able to have medication that works from 8 AM to 8 PM at night unfortunately without significantly impacting his sleep quality.  GAD (generalized anxiety disorder)    He is doing some therapy/counseling through telephonic program at work which I am excited that he has reached out for help and encouraged him to continue with the therapy.  At some point he may want to consider prescription treatment options as well especially if treating the inattention does not significantly improve some of his other symptoms.       Other Visit Diagnoses    Thiamin deficiency       Relevant Orders   Vitamin B1   Screening for malignant neoplasm of prostate       Relevant Orders   PSA     Thiamine deficiency-due to recheck vitamin B1.  Meds ordered this encounter  Medications  . Saxagliptin-Metformin  (KOMBIGLYZE XR) 06-998 MG TB24    Sig: Take 1 tablet by mouth daily.    Dispense:  90 tablet    Refill:  1    Follow-up: Return in about 3 months (around 11/27/2019) for Diabetes follow-up.    Beatrice Lecher, MD

## 2019-08-27 NOTE — Assessment & Plan Note (Signed)
Blood pressure is at goal.  He has recently started Adderall and it looks like his blood pressure is still normal.

## 2019-08-27 NOTE — Assessment & Plan Note (Signed)
He is doing some therapy/counseling through telephonic program at work which I am excited that he has reached out for help and encouraged him to continue with the therapy.  At some point he may want to consider prescription treatment options as well especially if treating the inattention does not significantly improve some of his other symptoms.

## 2019-08-27 NOTE — Assessment & Plan Note (Signed)
Uncontrolled.  Hemoglobin A1c 7.0 today up from previous of 6.2.  He also recently on vacation.  Discussed getting back on track and at least retrying the Trulicity.  It can cause nausea so if he feels like that is not resolving after week then please let us know.  Also reminded him that he has to start at 0.75 he can restart at 1.5 mg.  So he will need to let me know if he needs a new prescription.  I did go ahead and switch the medication to [facility] since he is been tolerating that well.  Provided coupon card.  It may require prior authorization with the insurance I am not sure.

## 2019-08-27 NOTE — Assessment & Plan Note (Signed)
Recently started on Adderall via psychiatry.  They are still adjusting his medication and dosing.  We discussed in general how Adderall works and that he is not going to be able to have medication that works from 8 AM to 8 PM at night unfortunately without significantly impacting his sleep quality.

## 2019-09-02 LAB — LIPID PANEL W/REFLEX DIRECT LDL
Cholesterol: 115 mg/dL (ref <200)
HDL: 36 mg/dL — ABNORMAL LOW (ref >=40)
LDL Cholesterol (Calc): 59 mg/dL (calc)
Non-HDL Cholesterol (Calc): 79 mg/dL (calc) (ref <130)
Total CHOL/HDL Ratio: 3.2 (calc) (ref <5.0)
Triglycerides: 122 mg/dL (ref <150)

## 2019-09-02 LAB — PSA: PSA: 2.4 ng/mL (ref <=4.0)

## 2019-09-02 LAB — VITAMIN B1: Vitamin B1 (Thiamine): 7 nmol/L — ABNORMAL LOW (ref 8–30)

## 2019-09-28 ENCOUNTER — Other Ambulatory Visit: Payer: Self-pay | Admitting: Family Medicine

## 2019-09-28 DIAGNOSIS — I10 Essential (primary) hypertension: Secondary | ICD-10-CM

## 2019-09-29 ENCOUNTER — Encounter: Payer: Self-pay | Admitting: Family Medicine

## 2019-10-06 ENCOUNTER — Emergency Department
Admission: EM | Admit: 2019-10-06 | Discharge: 2019-10-06 | Disposition: A | Discharge status: Home / Self Care | Payer: BC Managed Care – PPO | Source: Home / Self Care | Attending: Family Medicine | Admitting: Family Medicine

## 2019-10-06 ENCOUNTER — Other Ambulatory Visit: Payer: Self-pay

## 2019-10-06 DIAGNOSIS — L03113 Cellulitis of right upper limb: Secondary | ICD-10-CM | POA: Diagnosis not present

## 2019-10-06 MED ORDER — SULFAMETHOXAZOLE-TRIMETHOPRIM 800-160 MG PO TABS: 1.0000 | ORAL_TABLET | Freq: Two times a day (BID) | ORAL | 0 refills | Status: AC

## 2019-10-06 NOTE — Discharge Instructions (Signed)
Please follow up in 2 days if your symptoms fail to improve  Please take ibuprofen and tylenol as needed

## 2019-10-06 NOTE — ED Provider Notes (Signed)
[name]    CSN: [id number] Arrival date & time: 10/06/19  1727      History   Chief Complaint Chief Complaint  Patient presents with  . Cellulitis    Tattoo Infection    HPI Nicholas Christian is a 54 y.o. male.   He is presenting with right arm redness and pain.  He had tattoo colored over the past weekend.  Over the past 2 days he has noticed swelling and redness and pain of his right forearm.  He did take some cefdinir that he found at home.  Symptoms seem to be getting worse.  HPI  Past Medical History:  Diagnosis Date  . Complication of anesthesia    low O2 sat after kidney surgery ~ 2009  . DDD (degenerative disc disease), cervical   . Diabetes mellitus without complication ([place])   . Diverticulitis   . Eosinophilic esophagitis    hx of- [facility]  . GERD (gastroesophageal reflux disease)   . High triglycerides   . History of hiatal hernia   . History of kidney stones    hx  . Low HDL (under 40)   . Low testosterone   . Migraines   . Normal cardiac stress test    DOBUTAMINE STRESS ECHO 10 years ago   . OSA (obstructive sleep apnea)    no cpap used  . Pneumonia 10/15   no problems now    Patient Active Problem List   Diagnosis Date Noted  . Inattention 08/27/2019  . History of lumbar laminectomy 01/22/2019  . Primary osteoarthritis of left hip 12/25/2018  . Chronic left-sided low back pain without sciatica 07/14/2018  . History of diverticulitis 04/22/2018  . Calculus of gallbladder without cholecystitis without obstruction 04/22/2018  . Non-recurrent bilateral inguinal hernia without obstruction or gangrene 03/17/2018  . Umbilical hernia without obstruction and without gangrene 03/17/2018  . History of Clostridium difficile colitis 05/09/2015  . Herniated nucleus pulposus, thoracic 01/18/2014  . Eosinophilic esophagitis 01/75/1025  . Obesity 02/16/2011  . Controlled type 2 diabetes mellitus without complication, without long-term  current use of insulin ([place]) 02/07/2011  . OSA (obstructive sleep apnea) 11/16/2009  . ESSENTIAL HYPERTENSION, BENIGN 11/16/2009  . BACK PAIN, THORACIC REGION 11/24/2007  . DIVERTICULOSIS OF COLON 06/25/2007  . Fatty liver disease, nonalcoholic 85/27/7824  . GAD (generalized anxiety disorder) 11/20/2005  . MIGRAINE, UNSPEC., W/O INTRACTABLE MIGRAINE 11/20/2005  . Gastroesophageal reflux disease with esophagitis 11/20/2005  . KIDNEY STONE - NEPHROLITHIASIS 11/20/2005    Past Surgical History:  Procedure Laterality Date  . EDG  01/03/12  . ESOPHAGEAL DILATION  07/18/11   Dr. Tora Duck  . KIDNEY STONE SURGERY     laser at [place] MICRODISCECTOMY Left 01/18/2014   Procedure: Left Thoracic five-six microdiskectomy;  Surgeon: Newman Pies, MD;  Location: [facility];  Service: Neurosurgery;  Laterality: Left;  . NASAL SINUS SURGERY     Removal polyps   . TONSILLECTOMY AND ADENOIDECTOMY    . TYMPANOSTOMY TUBE PLACEMENT         Home Medications    Prior to Admission medications   Medication Sig Start Date End Date Taking? Authorizing Provider  AMBULATORY NON FORMULARY MEDICATION Medication Name: Glucometer and strips and lancet to check once a day.  Dx 250.00 12/10/14   Hali Marry, MD  amphetamine-dextroamphetamine (ADDERALL XR) 20 MG 24 hr capsule Take 20 mg by mouth daily. 08/11/19   [provider]  amphetamine-dextroamphetamine (  ADDERALL) 10 MG tablet Take 10 mg by mouth daily. 08/07/19   [provider]  atorvastatin (LIPITOR) 20 MG tablet Take 1 tablet (20 mg total) by mouth daily. 12/08/18   Hali Marry, MD  cholecalciferol (VITAMIN D3) 25 MCG (1000 UT) tablet Take 1,000 Units by mouth daily.    [provider]  Dulaglutide (TRULICITY) 1.5 ZO/1.0RU SOPN INJECT 1.5 MG INTO THE SKIN EVERY 7 DAYS 05/11/19   Hali Marry, MD  Insulin Pen Needle (PEN NEEDLES) 31G X 8 MM MISC Use for  administraiton of Victoza daily. 06/27/15   Hali Marry, MD  lisinopril (ZESTRIL) 10 MG tablet TAKE 1 TABLET(10 MG) BY MOUTH DAILY 09/29/19   Hali Marry, MD  omeprazole (PRILOSEC) 40 MG capsule Take 1 capsule by mouth. 09/02/18   [provider]  Saxagliptin-Metformin (KOMBIGLYZE XR) 06-998 MG TB24 Take 1 tablet by mouth daily. 08/27/19   Hali Marry, MD  sulfamethoxazole-trimethoprim (BACTRIM DS) 800-160 MG tablet Take 1 tablet by mouth 2 (two) times daily for 7 days. 10/06/19 10/13/19  Rosemarie Ax, MD    Family History Family History  Problem Relation Age of Onset  . Prostate cancer Father        oldr when dx   . Diabetes Maternal Grandmother     Social History Social History   Tobacco Use  . Smoking status: Never Smoker  . Smokeless tobacco: Never Used  Vaping Use  . Vaping Use: Never used  Substance Use Topics  . Alcohol use: No  . Drug use: No     Allergies   Januvia [sitagliptin] and Niacin   Review of Systems Review of Systems  See HPI  Physical Exam Triage Vital Signs ED Triage Vitals  Enc Vitals Group     BP 10/06/19 1808 120/84     Pulse Rate 10/06/19 1808 91     Resp 10/06/19 1808 16     Temp 10/06/19 1808 98.9 F (37.2 C)     Temp Source 10/06/19 1808 Oral     SpO2 10/06/19 1808 100 %     Weight --      Height --      Head Circumference --      Peak Flow --      Pain Score 10/06/19 1806 2     Pain Loc --      Pain Edu? --      Excl. in [place]? --    No data found.  Updated Vital Signs BP 120/84 (BP Location: Right Arm)   Pulse 91   Temp 98.9 F (37.2 C) (Oral)   Resp 16   SpO2 100%   Visual Acuity Right Eye Distance:   Left Eye Distance:   Bilateral Distance:    Right Eye Near:   Left Eye Near:    Bilateral Near:     Physical Exam Gen: NAD, alert, cooperative with exam, well-appearing ENT: normal lips, normal nasal mucosa,  Eye: normal EOM, normal conjunctiva and lids Skin: Occurring on  posterior forearm, some streaking proximally, tissue in this area is warm and tender to the touch, no abscess appreciated Neuro: normal tone, normal sensation to touch Psych:  normal insight, alert and oriented MSK:  Right arm: Normal elbow range of motion. Normal hand range of motion. Normal finger range of motion. Neurovascular intact   UC Treatments / Results  Labs (all labs ordered are listed, but only abnormal results are displayed) Labs Reviewed - No data to  display  EKG   Radiology No results found.  Procedures Procedures (including critical care time)  Medications Ordered in UC Medications - No data to display  Initial Impression / Assessment and Plan / UC Course  I have reviewed the triage vital signs and the nursing notes.  Pertinent labs & imaging results that were available during my care of the patient were reviewed by me and considered in my medical decision making (see chart for details).     Nicholas Christian is a 54 year old male that is presenting with symptoms suggestive of cellulitis.  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  Will provide Bactrim.  Counseled on supportive care.  Counseled on close follow-up.  Final Clinical Impressions(s) / UC Diagnoses   Final diagnoses:  Cellulitis of right upper extremity     Discharge Instructions     Please follow up in 2 days if your symptoms fail to improve  Please take ibuprofen and tylenol as needed      ED Prescriptions    Medication Sig Dispense Auth. Provider   sulfamethoxazole-trimethoprim (BACTRIM DS) 800-160 MG tablet Take 1 tablet by mouth 2 (two) times daily for 7 days. 14 tablet Rosemarie Ax, MD     PDMP not reviewed this encounter.   Rosemarie Ax, MD 10/06/19 2136

## 2019-10-06 NOTE — ED Triage Notes (Signed)
Patient presents to Urgent Care with complaints of redness and swelling to right forearm since yesterday. Patient reports he just got it tattooed and went to hot yoga the next day and thinks the yoga mat may have gotten bacteria in his skin.

## 2019-11-16 ENCOUNTER — Emergency Department: Admit: 2019-11-16 | Discharge status: Absent without leave | Payer: Self-pay

## 2019-11-16 ENCOUNTER — Encounter: Payer: Self-pay | Admitting: Family Medicine

## 2019-11-27 ENCOUNTER — Other Ambulatory Visit: Payer: Self-pay

## 2019-11-27 ENCOUNTER — Encounter: Payer: Self-pay | Admitting: Family Medicine

## 2019-11-27 ENCOUNTER — Ambulatory Visit (INDEPENDENT_AMBULATORY_CARE_PROVIDER_SITE_OTHER): Payer: BC Managed Care – PPO | Admitting: Family Medicine

## 2019-11-27 VITALS — BP 133/75 | HR 83 | Ht 70.0 in | Wt 221.0 lb

## 2019-11-27 DIAGNOSIS — I10 Essential (primary) hypertension: Secondary | ICD-10-CM | POA: Diagnosis not present

## 2019-11-27 DIAGNOSIS — R21 Rash and other nonspecific skin eruption: Secondary | ICD-10-CM

## 2019-11-27 DIAGNOSIS — E119 Type 2 diabetes mellitus without complications: Secondary | ICD-10-CM | POA: Diagnosis not present

## 2019-11-27 DIAGNOSIS — Z23 Encounter for immunization: Secondary | ICD-10-CM

## 2019-11-27 DIAGNOSIS — I77819 Aortic ectasia, unspecified site: Secondary | ICD-10-CM

## 2019-11-27 LAB — POCT GLYCOSYLATED HEMOGLOBIN (HGB A1C): Hemoglobin A1C: 6.5 % — AB (ref 4.0–5.6)

## 2019-11-27 NOTE — Progress Notes (Signed)
Established Patient Office Visit  Subjective:  Patient ID: Nicholas Christian, male    DOB: 1965/07/08  Age: 54 y.o. MRN: [id number]  CC:  Chief Complaint  Patient presents with  . Diabetes  . scabs    on R forearm after tattoo    HPI Nicholas Christian presents for   Hypertension- Pt denies chest pain, SOB, dizziness, or heart palpitations.  Taking meds as directed w/o problems.  Denies medication side effects.    Diabetes - no hypoglycemic events. No wounds or sores that are not healing well. No increased thirst or urination. Checking glucose at home. Taking medications as prescribed without any side effects.  He also wanted to take a look at his tattoo on his right forearm that he recently got.  He says that he has been getting these raised crusty spots over areas of the tattoo.  He has been picking at them somewhat.  They are not itchy or irritating.  He was also noted to have aortic dilatation on echocardiogram from February 2020.  Recommendation for was for him to have a repeat evaluation in 1 year goal is to keep blood pressure maximally controlled as well.  Past Medical History:  Diagnosis Date  . Complication of anesthesia    low O2 sat after kidney surgery ~ 2009  . DDD (degenerative disc disease), cervical   . Diabetes mellitus without complication ([place])   . Diverticulitis   . Eosinophilic esophagitis    hx of- [facility]  . GERD (gastroesophageal reflux disease)   . High triglycerides   . History of hiatal hernia   . History of kidney stones    hx  . Low HDL (under 40)   . Low testosterone   . Migraines   . Normal cardiac stress test    DOBUTAMINE STRESS ECHO 10 years ago   . OSA (obstructive sleep apnea)    no cpap used  . Pneumonia 10/15   no problems now    Past Surgical History:  Procedure Laterality Date  . EDG  01/03/12  . ESOPHAGEAL DILATION  07/18/11   Dr. Tora Duck  . KIDNEY STONE SURGERY     laser at [place] MICRODISCECTOMY Left 01/18/2014   Procedure: Left Thoracic five-six microdiskectomy;  Surgeon: Newman Pies, MD;  Location: [facility];  Service: Neurosurgery;  Laterality: Left;  . NASAL SINUS SURGERY     Removal polyps   . TONSILLECTOMY AND ADENOIDECTOMY    . TYMPANOSTOMY TUBE PLACEMENT      Family History  Problem Relation Age of Onset  . Prostate cancer Father        oldr when dx   . Diabetes Maternal Grandmother     Social History   Socioeconomic History  . Marital status: Married    Spouse name: Not on file  . Number of children: 3  . Years of education: Not on file  . Highest education level: Not on file  Occupational History  . 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   Tobacco Use  . Smoking status: Never Smoker  . Smokeless tobacco: Never Used  Vaping Use  . Vaping Use: Never used  Substance and Sexual Activity  . Alcohol use: No  . Drug use: No  . Sexual activity: Yes    Partners: Female  Other Topics Concern  . Not on file  Social History Narrative   No regular exercise. 1 caffeine /soda  per day.    Social Determinants of Health   Financial Resource Strain:   . Difficulty of Paying Living Expenses: Not on file  Food Insecurity:   . Worried About Charity fundraiser in the Last Year: Not on file  . Ran Out of Food in the Last Year: Not on file  Transportation Needs:   . Lack of Transportation (Medical): Not on file  . Lack of Transportation (Non-Medical): Not on file  Physical Activity:   . Days of Exercise per Week: Not on file  . Minutes of Exercise per Session: Not on file  Stress:   . Feeling of Stress : Not on file  Social Connections:   . Frequency of Communication with Friends and Family: Not on file  . Frequency of Social Gatherings with Friends and Family: Not on file  . Attends Religious Services: Not on file  . Active Member of Clubs or Organizations: Not on file  . Attends Theatre manager Meetings: Not on file  . Marital Status: Not on file  Intimate Partner Violence:   . Fear of Current or Ex-Partner: Not on file  . Emotionally Abused: Not on file  . Physically Abused: Not on file  . Sexually Abused: Not on file    Outpatient Medications Prior to Visit  Medication Sig Dispense Refill  . AMBULATORY NON FORMULARY MEDICATION Medication Name: Glucometer and strips and lancet to check once a day.  Dx 250.00 1 Units 11  . amphetamine-dextroamphetamine (ADDERALL) 10 MG tablet Take 10 mg by mouth daily.    Marland Kitchen atorvastatin (LIPITOR) 20 MG tablet Take 1 tablet (20 mg total) by mouth daily. 90 tablet 3  . busPIRone (BUSPAR) 5 MG tablet Take 5 mg by mouth daily.    . cholecalciferol (VITAMIN D3) 25 MCG (1000 UT) tablet Take 1,000 Units by mouth daily.    . Dulaglutide (TRULICITY) 1.5 KY/7.0WC SOPN INJECT 1.5 MG INTO THE SKIN EVERY 7 DAYS 4 mL 3  . Insulin Pen Needle (PEN NEEDLES) 31G X 8 MM MISC Use for administraiton of Victoza daily. 100 each 2  . lisinopril (ZESTRIL) 10 MG tablet TAKE 1 TABLET(10 MG) BY MOUTH DAILY 90 tablet 1  . omeprazole (PRILOSEC) 40 MG capsule Take 1 capsule by mouth.    . Saxagliptin-Metformin (KOMBIGLYZE XR) 06-998 MG TB24 Take 1 tablet by mouth daily. 90 tablet 1  . amphetamine-dextroamphetamine (ADDERALL XR) 20 MG 24 hr capsule Take 20 mg by mouth daily.     No facility-administered medications prior to visit.    Allergies  Allergen Reactions  . Januvia [Sitagliptin] Other (See Comments)    sulfurous burps and nausea.   . Niacin Other (See Comments)    Reaction not recalled    ROS Review of Systems    Objective:    Physical Exam Constitutional:      Appearance: He is well-developed.  HENT:     Head: Normocephalic and atraumatic.  Cardiovascular:     Rate and Rhythm: Normal rate and regular rhythm.     Heart sounds: Normal heart sounds.  Pulmonary:     Effort: Pulmonary effort is normal.     Breath sounds: Normal breath  sounds.  Skin:    General: Skin is warm and dry.  Neurological:     Mental Status: He is alert and oriented to person, place, and time.  Psychiatric:        Behavior: Behavior normal.     BP 133/75   Pulse 83  Ht 5\' 10"  (1.778 m)   Wt 221 lb (100.2 kg)   SpO2 100%   BMI 31.71 kg/m  Wt Readings from Last 3 Encounters:  11/27/19 221 lb (100.2 kg)  08/27/19 234 lb (106.1 kg)  04/22/19 238 lb (108 kg)     Health Maintenance Due  Topic Date Due  . Hepatitis C Screening  Never done    There are no preventive care reminders to display for this patient.  Lab Results  Component Value Date   TSH 1.80 12/23/2018   Lab Results  Component Value Date   WBC 6.7 06/16/2018   HGB 16.0 06/16/2018   HCT 47.2 06/16/2018   MCV 78.9 (L) 06/16/2018   PLT 199 06/16/2018   Lab Results  Component Value Date   NA 142 11/27/2019   K 3.8 11/27/2019   CO2 31 11/27/2019   GLUCOSE 136 11/27/2019   BUN 12 11/27/2019   CREATININE 1.24 11/27/2019   BILITOT 0.7 11/27/2019   ALKPHOS 50 04/22/2018   AST 22 11/27/2019   ALT 27 11/27/2019   PROT 6.8 11/27/2019   ALBUMIN 3.4 (L) 04/22/2018   CALCIUM 9.5 11/27/2019   ANIONGAP 9 04/22/2018   Lab Results  Component Value Date   CHOL 115 08/27/2019   Lab Results  Component Value Date   HDL 36 (L) 08/27/2019   Lab Results  Component Value Date   LDLCALC 59 08/27/2019   Lab Results  Component Value Date   TRIG 122 08/27/2019   Lab Results  Component Value Date   CHOLHDL 3.2 08/27/2019   Lab Results  Component Value Date   HGBA1C 6.5 (A) 11/27/2019      Assessment & Plan:   Problem List Items Addressed This Visit      Cardiovascular and Mediastinum   ESSENTIAL HYPERTENSION, BENIGN    Well controlled.  Blood pressure is okay today would prefer systolic be less than 825 but we will keep an eye on this.  He actually has lost some weight since he was last here and is doing really well on that in.       Relevant Orders    COMPLETE METABOLIC PANEL WITH GFR (Completed)   POCT glycosylated hemoglobin (Hb A1C) (Completed)     Endocrine   Controlled type 2 diabetes mellitus without complication, without long-term current use of insulin ([place]) - Primary    Well controlled. Hemoglobin of 6.5 today which looks great much improved from previous of 7.0 so he really has done a great job.  Follow-up in 3 to 4 months.  Continue ACE inhibitor and statin.       Relevant Orders   COMPLETE METABOLIC PANEL WITH GFR (Completed)   POCT glycosylated hemoglobin (Hb A1C) (Completed)    Other Visit Diagnoses    Need for Zostavax administration       Relevant Orders   Varicella-zoster vaccine IM (Shingrix) (Completed)   Rash       Relevant Medications   triamcinolone cream (KENALOG) 0.1 %   Aortic dilatation ([place])       Relevant Orders   ECHOCARDIOGRAM COMPLETE   US AORTA     Shingles vaccine given today.    In regards to the scabs on his arm they look most consistent with seborrheic keratoses but it would be unusual for those to just suddenly appear.  We discussed trying to moisturize the area avoiding scratching and picking.  And also give him a topical steroid cream that he can use as  needed to help with irritation  Meds ordered this encounter  Medications  . triamcinolone cream (KENALOG) 0.1 %    Sig: Apply 1 application topically 2 (two) times daily.    Dispense:  30 g    Refill:  0    Follow-up: Return in about 3 months (around 02/27/2020) for Diabetes follow-up.   I spent 38 minutes on the day of the encounter to include pre-visit record review, face-to-face time with the patient and post visit ordering of test.   Beatrice Lecher, MD

## 2019-11-28 LAB — COMPLETE METABOLIC PANEL WITH GFR
AG Ratio: 1.8 (calc) (ref 1.0–2.5)
ALT: 27 U/L (ref 9–46)
AST: 22 U/L (ref 10–35)
Albumin: 4.4 g/dL (ref 3.6–5.1)
Alkaline phosphatase (APISO): 75 U/L (ref 35–144)
BUN: 12 mg/dL (ref 7–25)
CO2: 31 mmol/L (ref 20–32)
Calcium: 9.5 mg/dL (ref 8.6–10.3)
Chloride: 102 mmol/L (ref 98–110)
Creat: 1.24 mg/dL (ref 0.70–1.33)
GFR, Est African American: 76 mL/min/{1.73_m2} (ref >=60)
GFR, Est Non African American: 66 mL/min/{1.73_m2} (ref >=60)
Globulin: 2.4 g/dL (calc) (ref 1.9–3.7)
Glucose, Bld: 136 mg/dL (ref 65–139)
Potassium: 3.8 mmol/L (ref 3.5–5.3)
Sodium: 142 mmol/L (ref 135–146)
Total Bilirubin: 0.7 mg/dL (ref 0.2–1.2)
Total Protein: 6.8 g/dL (ref 6.1–8.1)

## 2019-11-30 NOTE — Progress Notes (Signed)
All labs are normal. 

## 2019-12-01 ENCOUNTER — Encounter: Payer: Self-pay | Admitting: Family Medicine

## 2019-12-01 MED ORDER — TRIAMCINOLONE ACETONIDE 0.1 % EX CREA: 1.0000 "application " | TOPICAL_CREAM | Freq: Two times a day (BID) | CUTANEOUS | 0 refills | Status: DC

## 2019-12-01 NOTE — Assessment & Plan Note (Addendum)
Well controlled.  Blood pressure is okay today would prefer systolic be less than 439 but we will keep an eye on this.  He actually has lost some weight since he was last here and is doing really well on that in.

## 2019-12-01 NOTE — Assessment & Plan Note (Addendum)
Well controlled. Hemoglobin of 6.5 today which looks great much improved from previous of 7.0 so he really has done a great job.  Follow-up in 3 to 4 months.  Continue ACE inhibitor and statin.

## 2019-12-11 ENCOUNTER — Other Ambulatory Visit: Payer: Self-pay

## 2019-12-11 ENCOUNTER — Ambulatory Visit (HOSPITAL_BASED_OUTPATIENT_CLINIC_OR_DEPARTMENT_OTHER)
Admission: RE | Admit: 2019-12-11 | Discharge: 2019-12-11 | Disposition: A | Discharge status: Home / Self Care | Payer: BC Managed Care – PPO | Source: Ambulatory Visit | Attending: Family Medicine | Admitting: Family Medicine

## 2019-12-11 DIAGNOSIS — I77819 Aortic ectasia, unspecified site: Secondary | ICD-10-CM

## 2019-12-30 ENCOUNTER — Other Ambulatory Visit: Payer: Self-pay

## 2019-12-30 ENCOUNTER — Ambulatory Visit (HOSPITAL_BASED_OUTPATIENT_CLINIC_OR_DEPARTMENT_OTHER)
Admission: RE | Admit: 2019-12-30 | Discharge: 2019-12-30 | Disposition: A | Discharge status: Home / Self Care | Payer: BC Managed Care – PPO | Source: Ambulatory Visit | Attending: Family Medicine | Admitting: Family Medicine

## 2019-12-30 DIAGNOSIS — I77819 Aortic ectasia, unspecified site: Secondary | ICD-10-CM

## 2019-12-30 DIAGNOSIS — I10 Essential (primary) hypertension: Secondary | ICD-10-CM

## 2019-12-30 LAB — ECHOCARDIOGRAM COMPLETE
Area-P 1/2: 2.59 cm2
S' Lateral: 2.84 cm

## 2020-01-16 ENCOUNTER — Other Ambulatory Visit: Payer: Self-pay | Admitting: Family Medicine

## 2020-02-26 ENCOUNTER — Ambulatory Visit: Payer: BC Managed Care – PPO | Admitting: Family Medicine

## 2020-02-26 NOTE — Progress Notes (Deleted)
Established Patient Office Visit  Subjective:  Patient ID: Nicholas Christian, male    DOB: 1965-08-26  Age: 55 y.o. MRN: [id number]  CC: No chief complaint on file.   HPI Nicholas Christian presents for   Hypertension- Pt denies chest pain, SOB, dizziness, or heart palpitations.  Taking meds as directed w/o problems.  Denies medication side effects.    Impaired fasting glucose-no increased thirst or urination. No symptoms consistent with hypoglycemia.   Past Medical History:  Diagnosis Date  . Complication of anesthesia    low O2 sat after kidney surgery ~ 2009  . DDD (degenerative disc disease), cervical   . Diabetes mellitus without complication ([place])   . Diverticulitis   . Eosinophilic esophagitis    hx of- [facility]  . GERD (gastroesophageal reflux disease)   . High triglycerides   . History of hiatal hernia   . History of kidney stones    hx  . Low HDL (under 40)   . Low testosterone   . Migraines   . Normal cardiac stress test    DOBUTAMINE STRESS ECHO 10 years ago   . OSA (obstructive sleep apnea)    no cpap used  . Pneumonia 10/15   no problems now    Past Surgical History:  Procedure Laterality Date  . EDG  01/03/12  . ESOPHAGEAL DILATION  07/18/11   Dr. Tora Duck  . KIDNEY STONE SURGERY     laser at [place] MICRODISCECTOMY Left 01/18/2014   Procedure: Left Thoracic five-six microdiskectomy;  Surgeon: Newman Pies, MD;  Location: [facility];  Service: Neurosurgery;  Laterality: Left;  . NASAL SINUS SURGERY     Removal polyps   . TONSILLECTOMY AND ADENOIDECTOMY    . TYMPANOSTOMY TUBE PLACEMENT      Family History  Problem Relation Age of Onset  . Prostate cancer Father        oldr when dx   . Diabetes Maternal Grandmother     Social History   Socioeconomic History  . Marital status: Married    Spouse name: Not on file  . Number of children: 3  . Years of education: Not on file  . Highest  education level: Not on file  Occupational History  . 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   Tobacco Use  . Smoking status: Never Smoker  . Smokeless tobacco: Never Used  Vaping Use  . Vaping Use: Never used  Substance and Sexual Activity  . Alcohol use: No  . Drug use: No  . Sexual activity: Yes    Partners: Female  Other Topics Concern  . Not on file  Social History Narrative   No regular exercise. 1 caffeine /soda per day.    Social Determinants of Health   Financial Resource Strain: Not on file  Food Insecurity: Not on file  Transportation Needs: Not on file  Physical Activity: Not on file  Stress: Not on file  Social Connections: Not on file  Intimate Partner Violence: Not on file    Outpatient Medications Prior to Visit  Medication Sig Dispense Refill  . AMBULATORY NON FORMULARY MEDICATION Medication Name: Glucometer and strips and lancet to check once a day.  Dx 250.00 1 Units 11  . amphetamine-dextroamphetamine (ADDERALL) 10 MG tablet Take 10 mg by mouth daily.    Marland Kitchen atorvastatin (LIPITOR) 20 MG tablet TAKE 1 TABLET(20 MG) BY MOUTH DAILY 90 tablet 3  .  busPIRone (BUSPAR) 5 MG tablet Take 5 mg by mouth daily.    . cholecalciferol (VITAMIN D3) 25 MCG (1000 UT) tablet Take 1,000 Units by mouth daily.    . Dulaglutide (TRULICITY) 1.5 VO/3.5KK SOPN INJECT 1.5 MG INTO THE SKIN EVERY 7 DAYS 4 mL 3  . Insulin Pen Needle (PEN NEEDLES) 31G X 8 MM MISC Use for administraiton of Victoza daily. 100 each 2  . lisinopril (ZESTRIL) 10 MG tablet TAKE 1 TABLET(10 MG) BY MOUTH DAILY 90 tablet 1  . omeprazole (PRILOSEC) 40 MG capsule Take 1 capsule by mouth.    . Saxagliptin-Metformin (KOMBIGLYZE XR) 06-998 MG TB24 Take 1 tablet by mouth daily. 90 tablet 1  . triamcinolone cream (KENALOG) 0.1 % Apply 1 application topically 2 (two) times daily. 30 g 0   No facility-administered medications prior to visit.    Allergies  Allergen Reactions  . Januvia  [Sitagliptin] Other (See Comments)    sulfurous burps and nausea.   . Niacin Other (See Comments)    Reaction not recalled    ROS Review of Systems    Objective:    Physical Exam  There were no vitals taken for this visit. Wt Readings from Last 3 Encounters:  11/27/19 221 lb (100.2 kg)  08/27/19 234 lb (106.1 kg)  04/22/19 238 lb (108 kg)     Health Maintenance Due  Topic Date Due  . Hepatitis C Screening  Never done  . COVID-19 Vaccine (3 - Booster for Moderna series) 12/27/2019    There are no preventive care reminders to display for this patient.  Lab Results  Component Value Date   TSH 1.80 12/23/2018   Lab Results  Component Value Date   WBC 6.7 06/16/2018   HGB 16.0 06/16/2018   HCT 47.2 06/16/2018   MCV 78.9 (L) 06/16/2018   PLT 199 06/16/2018   Lab Results  Component Value Date   NA 142 11/27/2019   K 3.8 11/27/2019   CO2 31 11/27/2019   GLUCOSE 136 11/27/2019   BUN 12 11/27/2019   CREATININE 1.24 11/27/2019   BILITOT 0.7 11/27/2019   ALKPHOS 50 04/22/2018   AST 22 11/27/2019   ALT 27 11/27/2019   PROT 6.8 11/27/2019   ALBUMIN 3.4 (L) 04/22/2018   CALCIUM 9.5 11/27/2019   ANIONGAP 9 04/22/2018   Lab Results  Component Value Date   CHOL 115 08/27/2019   Lab Results  Component Value Date   HDL 36 (L) 08/27/2019   Lab Results  Component Value Date   LDLCALC 59 08/27/2019   Lab Results  Component Value Date   TRIG 122 08/27/2019   Lab Results  Component Value Date   CHOLHDL 3.2 08/27/2019   Lab Results  Component Value Date   HGBA1C 6.5 (A) 11/27/2019      Assessment & Plan:   Problem List Items Addressed This Visit      Cardiovascular and Mediastinum   ESSENTIAL HYPERTENSION, BENIGN     Endocrine   Controlled type 2 diabetes mellitus without complication, without long-term current use of insulin ([place]) - Primary      No orders of the defined types were placed in this encounter.   Follow-up: No follow-ups on file.     Beatrice Lecher, MD

## 2020-02-29 ENCOUNTER — Telehealth: Payer: Self-pay | Admitting: Family Medicine

## 2020-02-29 DIAGNOSIS — E119 Type 2 diabetes mellitus without complications: Secondary | ICD-10-CM

## 2020-02-29 MED ORDER — METFORMIN HCL 1000 MG PO TABS: 1000.0000 mg | ORAL_TABLET | Freq: Two times a day (BID) | ORAL | 1 refills | Status: DC

## 2020-02-29 NOTE — Telephone Encounter (Signed)
[name], I am reaching out bc I want to make a change to your diabetes regimen.  The trulicity shot you take does the bulk of the work and so I want to change your Kombiglyze to metformin alone. The onglyza part of the kombiglyze works similar to the [employer].  I will send over the plain metformin and let me know if you need a refill on yoru Trulicity.

## 2020-02-29 NOTE — Telephone Encounter (Signed)
Patient verbalized the understanding in changing the medications and will make the change. He does not know if he needs Trulicity just yet but will let his pharmacy know when he needs more.

## 2020-04-19 LAB — HM DIABETES EYE EXAM

## 2020-04-26 ENCOUNTER — Encounter: Payer: Self-pay | Admitting: Family Medicine

## 2020-05-05 ENCOUNTER — Other Ambulatory Visit: Payer: Self-pay | Admitting: Family Medicine

## 2020-05-05 DIAGNOSIS — E119 Type 2 diabetes mellitus without complications: Secondary | ICD-10-CM

## 2020-06-23 ENCOUNTER — Other Ambulatory Visit: Payer: Self-pay | Admitting: Family Medicine

## 2020-06-23 DIAGNOSIS — I10 Essential (primary) hypertension: Secondary | ICD-10-CM

## 2020-07-07 ENCOUNTER — Other Ambulatory Visit: Payer: Self-pay

## 2020-07-07 ENCOUNTER — Encounter: Payer: Self-pay | Admitting: Family Medicine

## 2020-07-07 ENCOUNTER — Ambulatory Visit: Payer: BC Managed Care – PPO | Admitting: Family Medicine

## 2020-07-07 VITALS — BP 123/68 | HR 94 | Ht 70.0 in | Wt 227.0 lb

## 2020-07-07 DIAGNOSIS — Z23 Encounter for immunization: Secondary | ICD-10-CM | POA: Diagnosis not present

## 2020-07-07 DIAGNOSIS — I10 Essential (primary) hypertension: Secondary | ICD-10-CM | POA: Diagnosis not present

## 2020-07-07 DIAGNOSIS — E119 Type 2 diabetes mellitus without complications: Secondary | ICD-10-CM | POA: Diagnosis not present

## 2020-07-07 DIAGNOSIS — Z566 Other physical and mental strain related to work: Secondary | ICD-10-CM | POA: Insufficient documentation

## 2020-07-07 DIAGNOSIS — M25561 Pain in right knee: Secondary | ICD-10-CM | POA: Diagnosis not present

## 2020-07-07 LAB — POCT GLYCOSYLATED HEMOGLOBIN (HGB A1C): HbA1c POC (<> result, manual entry): 11.1 % (ref 4.0–5.6)

## 2020-07-07 MED ORDER — KOMBIGLYZE XR 5-1000 MG PO TB24: 1.0000 | ORAL_TABLET | Freq: Every day | ORAL | 0 refills | Status: DC

## 2020-07-07 MED ORDER — ATORVASTATIN CALCIUM 20 MG PO TABS: ORAL_TABLET | ORAL | 3 refills | Status: DC

## 2020-07-07 MED ORDER — OZEMPIC (0.25 OR 0.5 MG/DOSE) 2 MG/1.5ML ~~LOC~~ SOPN: 0.2500 mg | PEN_INJECTOR | SUBCUTANEOUS | 3 refills | Status: DC

## 2020-07-07 NOTE — Progress Notes (Signed)
Established Patient Office Visit  Subjective:  Patient ID: Nicholas Christian, male    DOB: [birth year]  Age: 55 y.o. MRN: [id number]  CC:  Chief Complaint  Patient presents with  . Diabetes    HPI Xai Frerking presents for   Diabetes - no hypoglycemic events. No wounds or sores that are not healing well. No increased thirst or urination. Checking glucose at home. Reports metformin was causing GI upset so actually quit taking it several weeks ago he was taking it fairly inconsistently even before that.  He tried even taking it once a day. Would like to go back on the [place].  Has been really stress at work. Took a promotion and has been working longer hours and hasn't had time to exercise and has been eating poorly.  Has missed some of the Trulicity doses.    Hypertension- Pt denies chest pain, SOB, dizziness, or heart palpitations.  Taking meds as directed w/o problems.  Denies medication side effects.    Right medial knee pain x 2 mo.  No trauma or injury.  Feels like the knee wants to give out after stands when has been sitting for a long period.    Also quit his statin.  Having problems with pharmacy and getting his medications.   Past Medical History:  Diagnosis Date  . Complication of anesthesia    low O2 sat after kidney surgery ~ 2009  . DDD (degenerative disc disease), cervical   . Diabetes mellitus without complication ([place])   . Diverticulitis   . Eosinophilic esophagitis    hx of- [facility]  . GERD (gastroesophageal reflux disease)   . High triglycerides   . History of hiatal hernia   . History of kidney stones    hx  . Low HDL (under 40)   . Low testosterone   . Migraines   . Normal cardiac stress test    DOBUTAMINE STRESS ECHO 10 years ago   . OSA (obstructive sleep apnea)    no cpap used  . Pneumonia 10/15   no problems now    Past Surgical History:  Procedure Laterality Date  . EDG  01/03/12  . ESOPHAGEAL DILATION  07/18/11   Dr. Tora Duck  . KIDNEY STONE SURGERY     laser at [place] MICRODISCECTOMY Left 01/18/2014   Procedure: Left Thoracic five-six microdiskectomy;  Surgeon: Newman Pies, MD;  Location: [facility];  Service: Neurosurgery;  Laterality: Left;  . NASAL SINUS SURGERY     Removal polyps   . TONSILLECTOMY AND ADENOIDECTOMY    . TYMPANOSTOMY TUBE PLACEMENT      Family History  Problem Relation Age of Onset  . Prostate cancer Father        oldr when dx   . Diabetes Maternal Grandmother     Social History   Socioeconomic History  . Marital status: Married    Spouse name: Not on file  . Number of children: 3  . Years of education: Not on file  . Highest education level: Not on file  Occupational History  . 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   Tobacco Use  . Smoking status: Never Smoker  . Smokeless tobacco: Never Used  Vaping Use  . Vaping Use: Never used  Substance and Sexual Activity  . Alcohol use: No  . Drug use: No  . Sexual activity: Yes    Partners: Female  Other Topics  Concern  . Not on file  Social History Narrative   No regular exercise. 1 caffeine /soda per day.    Social Determinants of Health   Financial Resource Strain: Not on file  Food Insecurity: Not on file  Transportation Needs: Not on file  Physical Activity: Not on file  Stress: Not on file  Social Connections: Not on file  Intimate Partner Violence: Not on file    Outpatient Medications Prior to Visit  Medication Sig Dispense Refill  . AMBULATORY NON FORMULARY MEDICATION Medication Name: Glucometer and strips and lancet to check once a day.  Dx 250.00 1 Units 11  . amphetamine-dextroamphetamine (ADDERALL) 10 MG tablet Take 10 mg by mouth daily.    . cholecalciferol (VITAMIN D3) 25 MCG (1000 UT) tablet Take 1,000 Units by mouth daily.    . Insulin Pen Needle (PEN NEEDLES) 31G X 8 MM MISC Use for administraiton of Victoza daily. 100  each 2  . lisinopril (ZESTRIL) 10 MG tablet TAKE 1 TABLET(10 MG) BY MOUTH DAILY 90 tablet 1  . omeprazole (PRILOSEC) 40 MG capsule Take 1 capsule by mouth.    . triamcinolone cream (KENALOG) 0.1 % Apply 1 application topically 2 (two) times daily. 30 g 0  . atorvastatin (LIPITOR) 20 MG tablet TAKE 1 TABLET(20 MG) BY MOUTH DAILY 90 tablet 3  . busPIRone (BUSPAR) 5 MG tablet Take 5 mg by mouth daily.    . Dulaglutide (TRULICITY) 1.5 JJ/0.0XF SOPN ADMINISTER 1.5 MG UNDER THE SKIN EVERY 7 DAYS 4 mL 3  . metFORMIN (GLUCOPHAGE) 1000 MG tablet Take 1 tablet (1,000 mg total) by mouth 2 (two) times daily with a meal. 180 tablet 1   No facility-administered medications prior to visit.    Allergies  Allergen Reactions  . Januvia [Sitagliptin] Other (See Comments)    sulfurous burps and nausea.   . Niacin Other (See Comments)    Reaction not recalled    ROS Review of Systems    Objective:    Physical Exam Constitutional:      Appearance: He is well-developed.  HENT:     Head: Normocephalic and atraumatic.  Cardiovascular:     Rate and Rhythm: Normal rate and regular rhythm.     Heart sounds: Normal heart sounds.  Pulmonary:     Effort: Pulmonary effort is normal.     Breath sounds: Normal breath sounds.  Skin:    General: Skin is warm and dry.  Neurological:     Mental Status: He is alert and oriented to person, place, and time.  Psychiatric:        Behavior: Behavior normal.     BP 123/68   Pulse 94   Ht 5\' 10"  (1.778 m)   Wt 227 lb (103 kg)   SpO2 97%   BMI 32.57 kg/m  Wt Readings from Last 3 Encounters:  07/07/20 227 lb (103 kg)  11/27/19 221 lb (100.2 kg)  08/27/19 234 lb (106.1 kg)     Health Maintenance Due  Topic Date Due  . Hepatitis C Screening  Never done  . Zoster Vaccines- Shingrix (1 of 2) 04/06/2016  . COVID-19 Vaccine (3 - Booster for Moderna series) 11/26/2019    There are no preventive care reminders to display for this patient.  Lab Results   Component Value Date   TSH 1.80 12/23/2018   Lab Results  Component Value Date   WBC 6.7 06/16/2018   HGB 16.0 06/16/2018   HCT 47.2 06/16/2018   MCV  78.9 (L) 06/16/2018   PLT 199 06/16/2018   Lab Results  Component Value Date   NA 142 11/27/2019   K 3.8 11/27/2019   CO2 31 11/27/2019   GLUCOSE 136 11/27/2019   BUN 12 11/27/2019   CREATININE 1.24 11/27/2019   BILITOT 0.7 11/27/2019   ALKPHOS 50 04/22/2018   AST 22 11/27/2019   ALT 27 11/27/2019   PROT 6.8 11/27/2019   ALBUMIN 3.4 (L) 04/22/2018   CALCIUM 9.5 11/27/2019   ANIONGAP 9 04/22/2018   Lab Results  Component Value Date   CHOL 115 08/27/2019   Lab Results  Component Value Date   HDL 36 (L) 08/27/2019   Lab Results  Component Value Date   LDLCALC 59 08/27/2019   Lab Results  Component Value Date   TRIG 122 08/27/2019   Lab Results  Component Value Date   CHOLHDL 3.2 08/27/2019   Lab Results  Component Value Date   HGBA1C 11.1 07/07/2020      Assessment & Plan:   Problem List Items Addressed This Visit      Cardiovascular and Mediastinum   ESSENTIAL HYPERTENSION, BENIGN    Well controlled. Continue current regimen. Follow up in  6 mo .  Med refilled.        Relevant Medications   atorvastatin (LIPITOR) 20 MG tablet   Other Relevant Orders   BASIC METABOLIC PANEL WITH GFR     Endocrine   Controlled type 2 diabetes mellitus without complication, without long-term current use of insulin ([place]) - Primary    Uncontrolled. WEnt from 6.5 to 11.1.  Go back to [facility] since liked it better than plain metformin.  Will see if we can change trulicity to Ozempic.  Discussed important of getting back on track with diet and exercise and stopping all sugary beverages!!!  Please restart statin.  Still taking ACE      Relevant Medications   atorvastatin (LIPITOR) 20 MG tablet   Semaglutide,0.25 or 0.5MG /DOS, (OZEMPIC, 0.25 OR 0.5 MG/DOSE,) 2 MG/1.5ML SOPN   Saxagliptin-Metformin (KOMBIGLYZE XR)  06-998 MG TB24   Other Relevant Orders   POCT glycosylated hemoglobin (Hb A1C) (Completed)     Other   Work stress    Unfortunately work stress is affecting his self care which is causing severe consequences for his diabetes which is very concerning.  Work on weight to improve self care and reduce stress.         Other Visit Diagnoses    Acute pain of right knee       Relevant Orders   Ambulatory referral to Sports Medicine   Need for Zostavax administration       Relevant Orders   Varicella-zoster vaccine IM (Shingrix) (Completed)      Right knee pain - unclear etiology.  No fall or injury tender just above and posterior to the lateral joint line.  Encouraged him to schedule with Sports med.    Meds ordered this encounter  Medications  . atorvastatin (LIPITOR) 20 MG tablet    Sig: TAKE 1 TABLET(20 MG) BY MOUTH QHS    Dispense:  90 tablet    Refill:  3  . Semaglutide,0.25 or 0.5MG /DOS, (OZEMPIC, 0.25 OR 0.5 MG/DOSE,) 2 MG/1.5ML SOPN    Sig: Inject 0.25 mg into the skin once a week.    Dispense:  1.5 mL    Refill:  3  . Saxagliptin-Metformin (KOMBIGLYZE XR) 06-998 MG TB24    Sig: Take 1 tablet by mouth daily.  Dispense:  90 tablet    Refill:  0    Follow-up: Return in about 6 weeks (around 08/18/2020) for Diabetes follow-up.    Beatrice Lecher, MD

## 2020-07-07 NOTE — Assessment & Plan Note (Signed)
Well controlled. Continue current regimen. Follow up in  6 mo .  Med refilled.

## 2020-07-07 NOTE — Assessment & Plan Note (Addendum)
Uncontrolled. WEnt from 6.5 to 11.1.  Go back to [facility] since liked it better than plain metformin.  Will see if we can change trulicity to Ozempic.  Discussed important of getting back on track with diet and exercise and stopping all sugary beverages!!!  Please restart statin.  Still taking ACE

## 2020-07-07 NOTE — Assessment & Plan Note (Signed)
Unfortunately work stress is affecting his self care which is causing severe consequences for his diabetes which is very concerning.  Work on weight to improve self care and reduce stress.

## 2020-07-08 LAB — BASIC METABOLIC PANEL WITH GFR
BUN: 15 mg/dL (ref 7–25)
CO2: 27 mmol/L (ref 20–32)
Calcium: 9.4 mg/dL (ref 8.6–10.3)
Chloride: 103 mmol/L (ref 98–110)
Creat: 1.13 mg/dL (ref 0.70–1.33)
GFR, Est African American: 85 mL/min/{1.73_m2} (ref >=60)
GFR, Est Non African American: 73 mL/min/{1.73_m2} (ref >=60)
Glucose, Bld: 203 mg/dL — ABNORMAL HIGH (ref 65–99)
Potassium: 4.2 mmol/L (ref 3.5–5.3)
Sodium: 139 mmol/L (ref 135–146)

## 2020-08-18 ENCOUNTER — Encounter: Payer: Self-pay | Admitting: Family Medicine

## 2020-08-18 ENCOUNTER — Ambulatory Visit (INDEPENDENT_AMBULATORY_CARE_PROVIDER_SITE_OTHER): Payer: BC Managed Care – PPO | Admitting: Family Medicine

## 2020-08-18 ENCOUNTER — Other Ambulatory Visit: Payer: Self-pay

## 2020-08-18 VITALS — BP 125/77 | HR 94 | Ht 70.0 in | Wt 227.0 lb

## 2020-08-18 DIAGNOSIS — E119 Type 2 diabetes mellitus without complications: Secondary | ICD-10-CM

## 2020-08-18 DIAGNOSIS — R5383 Other fatigue: Secondary | ICD-10-CM

## 2020-08-18 DIAGNOSIS — I10 Essential (primary) hypertension: Secondary | ICD-10-CM

## 2020-08-18 DIAGNOSIS — G4733 Obstructive sleep apnea (adult) (pediatric): Secondary | ICD-10-CM

## 2020-08-18 LAB — POCT GLYCOSYLATED HEMOGLOBIN (HGB A1C): Hemoglobin A1C: 9.9 % — AB (ref 4.0–5.6)

## 2020-08-18 MED ORDER — OZEMPIC (0.25 OR 0.5 MG/DOSE) 2 MG/1.5ML ~~LOC~~ SOPN: 0.5000 mg | PEN_INJECTOR | SUBCUTANEOUS | 0 refills | Status: DC

## 2020-08-18 NOTE — Assessment & Plan Note (Addendum)
A1c has come down to 9.9 which is fantastic.  He is making some great progress continue with current medication would like to increase the Ozempic to 0.5 mg for 1 month.  Though I am concerned about the fevers that he has been getting about 2 days after each injection it is very short-lived but it is definitely unusual.  We discussed continuing to set small goals weekly weekly in regards to diet and exercise.  I like to see him lose about 30 pounds I think would make a great difference in his blood sugars in fact his A1c was phenomenal at 6.5 last October.

## 2020-08-18 NOTE — Assessment & Plan Note (Signed)
Well controlled. Continue current regimen. Follow up in  2 mo

## 2020-08-18 NOTE — Assessment & Plan Note (Signed)
Not using CPAP because it makes him feel claustrophobic. We discussed that this could certainly be contributing to some of his fatigue.  Weight loss would probably also help his sleep apnea.  Love to see him lose about 30 pounds.

## 2020-08-18 NOTE — Progress Notes (Signed)
Established Patient Office Visit  Subjective:  Patient ID: Nicholas Christian, male    DOB: 1965/04/10  Age: 55 y.o. MRN: [id number]  CC:  Chief Complaint  Patient presents with   Diabetes   Hypertension    HPI [name] presents for   Diabetes - no hypoglycemic events. No wounds or sores that are not healing well. No increased thirst or urination. Checking glucose at home. Taking medications as prescribed .He reports that about 2 days after each Ozempic injection of 0.25 mg he runs a temperature around 100 and 0.2.  Usually by the next day he wakes up he feels fine and does not have any other side effects with it.  He is also cut back on sugary drinks.  Hypertension- Pt denies chest pain, SOB, dizziness, or heart palpitations.  Taking meds as directed w/o problems.  Denies medication side effects.    He also reports for about the last 6 weeks he has had no energy at all he says he can fall asleep very easily.  No recent rashes headaches or tick bites etc.  The only thing really that is changed is that he was previously using the testosterone pellets and they ran out.  He was planning on going back but they lost his blood work and he got upset and decided not to have them placed again.  Otherwise no major changes he does have obstructive sleep apnea and opts not to treat it because the CPAP makes him feel claustrophobic.   Past Medical History:  Diagnosis Date   Complication of anesthesia    low O2 sat after kidney surgery ~ 2009   DDD (degenerative disc disease), cervical    Diabetes mellitus without complication (HCC)    Diverticulitis    Eosinophilic esophagitis    hx of- [facility]   GERD (gastroesophageal reflux disease)    High triglycerides    History of hiatal hernia    History of kidney stones    hx   Low HDL (under 40)    Low testosterone    Migraines    Normal cardiac stress test    DOBUTAMINE STRESS ECHO 10 years ago    OSA (obstructive sleep apnea)     no cpap used   Pneumonia 10/15   no problems now    Past Surgical History:  Procedure Laterality Date   EDG  01/03/12   ESOPHAGEAL DILATION  07/18/11   Dr. Tora Duck   KIDNEY STONE SURGERY     laser at [place] MICRODISCECTOMY Left 01/18/2014   Procedure: Left Thoracic five-six microdiskectomy;  Surgeon: Newman Pies, MD;  Location: [facility];  Service: Neurosurgery;  Laterality: Left;   NASAL SINUS SURGERY     Removal polyps    TONSILLECTOMY AND ADENOIDECTOMY     TYMPANOSTOMY TUBE PLACEMENT      Family History  Problem Relation Age of Onset   Prostate cancer Father        oldr when dx    Diabetes Maternal Grandmother     Social History   Socioeconomic History   Marital status: Married    Spouse name: Not on file   Number of children: 3   Years of education: Not on file   Highest education level: Not on file  Occupational History   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   Tobacco Use   Smoking status: Never   Smokeless tobacco: Never  Vaping Use   Vaping Use: Never used  Substance and Sexual Activity   Alcohol use: No   Drug use: No   Sexual activity: Yes    Partners: Female  Other Topics Concern   Not on file  Social History Narrative   No regular exercise. 1 caffeine /soda per day.    Social Determinants of Health   Financial Resource Strain: Not on file  Food Insecurity: Not on file  Transportation Needs: Not on file  Physical Activity: Not on file  Stress: Not on file  Social Connections: Not on file  Intimate Partner Violence: Not on file    Outpatient Medications Prior to Visit  Medication Sig Dispense Refill   [facility] Medication Name: Glucometer and strips and lancet to check once a day.  Dx 250.00 1 Units 11   amphetamine-dextroamphetamine (ADDERALL) 10 MG tablet Take 10 mg by mouth daily.     atorvastatin (LIPITOR) 20 MG tablet TAKE 1  TABLET(20 MG) BY MOUTH QHS 90 tablet 3   cholecalciferol (VITAMIN D3) 25 MCG (1000 UT) tablet Take 1,000 Units by mouth daily.     Insulin Pen Needle (PEN NEEDLES) 31G X 8 MM MISC Use for administraiton of Victoza daily. 100 each 2   lisinopril (ZESTRIL) 10 MG tablet TAKE 1 TABLET(10 MG) BY MOUTH DAILY 90 tablet 1   omeprazole (PRILOSEC) 40 MG capsule Take 1 capsule by mouth.     Saxagliptin-Metformin (KOMBIGLYZE XR) 06-998 MG TB24 Take 1 tablet by mouth daily. 90 tablet 0   triamcinolone cream (KENALOG) 0.1 % Apply 1 application topically 2 (two) times daily. 30 g 0   Semaglutide,0.25 or 0.5MG /DOS, (OZEMPIC, 0.25 OR 0.5 MG/DOSE,) 2 MG/1.5ML SOPN Inject 0.25 mg into the skin once a week. 1.5 mL 3   No facility-administered medications prior to visit.    Allergies  Allergen Reactions   Januvia [Sitagliptin] Other (See Comments)    sulfurous burps and nausea.    Niacin Other (See Comments)    Reaction not recalled    ROS Review of Systems    Objective:    Physical Exam Constitutional:      Appearance: He is well-developed.  HENT:     Head: Normocephalic and atraumatic.  Cardiovascular:     Rate and Rhythm: Normal rate and regular rhythm.     Heart sounds: Normal heart sounds.  Pulmonary:     Effort: Pulmonary effort is normal.     Breath sounds: Normal breath sounds.  Skin:    General: Skin is warm and dry.  Neurological:     Mental Status: He is alert and oriented to person, place, and time.  Psychiatric:        Behavior: Behavior normal.    BP 125/77   Pulse 94   Ht 5\' 10"  (1.778 m)   Wt 227 lb (103 kg)   SpO2 96%   BMI 32.57 kg/m  Wt Readings from Last 3 Encounters:  08/18/20 227 lb (103 kg)  07/07/20 227 lb (103 kg)  11/27/19 221 lb (100.2 kg)     Health Maintenance Due  Topic Date Due   Hepatitis C Screening  Never done   COVID-19 Vaccine (3 - Booster for Moderna series) 11/26/2019    There are no preventive care reminders to display for this  patient.  Lab Results  Component Value Date   TSH 1.80 12/23/2018   Lab Results  Component Value Date   WBC 6.7 06/16/2018   HGB 16.0  06/16/2018   HCT 47.2 06/16/2018   MCV 78.9 (L) 06/16/2018   PLT 199 06/16/2018   Lab Results  Component Value Date   NA 139 07/07/2020   K 4.2 07/07/2020   CO2 27 07/07/2020   GLUCOSE 203 (H) 07/07/2020   BUN 15 07/07/2020   CREATININE 1.13 07/07/2020   BILITOT 0.7 11/27/2019   ALKPHOS 50 04/22/2018   AST 22 11/27/2019   ALT 27 11/27/2019   PROT 6.8 11/27/2019   ALBUMIN 3.4 (L) 04/22/2018   CALCIUM 9.4 07/07/2020   ANIONGAP 9 04/22/2018   Lab Results  Component Value Date   CHOL 115 08/27/2019   Lab Results  Component Value Date   HDL 36 (L) 08/27/2019   Lab Results  Component Value Date   LDLCALC 59 08/27/2019   Lab Results  Component Value Date   TRIG 122 08/27/2019   Lab Results  Component Value Date   CHOLHDL 3.2 08/27/2019   Lab Results  Component Value Date   HGBA1C 9.9 (A) 08/18/2020      Assessment & Plan:   Problem List Items Addressed This Visit       Cardiovascular and Mediastinum   ESSENTIAL HYPERTENSION, BENIGN    Well controlled. Continue current regimen. Follow up in  2 mo          Respiratory   OSA (obstructive sleep apnea)    Not using CPAP because it makes him feel claustrophobic. We discussed that this could certainly be contributing to some of his fatigue.  Weight loss would probably also help his sleep apnea.  Love to see him lose about 30 pounds.         Endocrine   Controlled type 2 diabetes mellitus without complication, without long-term current use of insulin ([facility]) - Primary    A1c has come down to 9.9 which is fantastic.  He is making some great progress continue with current medication would like to increase the Ozempic to 0.5 mg for 1 month.  Though I am concerned about the fevers that he has been getting about 2 days after each injection it is very short-lived but it is  definitely unusual.  We discussed continuing to set small goals weekly weekly in regards to diet and exercise.  I like to see him lose about 30 pounds I think would make a great difference in his blood sugars in fact his A1c was phenomenal at 6.5 last October.       Relevant Medications   Semaglutide,0.25 or 0.5MG /DOS, (OZEMPIC, 0.25 OR 0.5 MG/DOSE,) 2 MG/1.5ML SOPN   Other Relevant Orders   POCT glycosylated hemoglobin (Hb A1C) (Completed)   Other Visit Diagnoses     Fatigue, unspecified type           Fatigue-we discussed possibly getting some blood work including a CBC sed rate etc.  But he may actually be getting some repeat blood work and deciding to do the testosterone pellet again he has not quite decided.  Certainly he could see if that does help with his energy levels but if not more than happy to work it up.   Fever-interestingly it was listed under the potential drug side effects and adolescents when the medication is used for weight loss.  I will also check with our clinical pharmacist to see if she is aware of anything  Meds ordered this encounter  Medications   Semaglutide,0.25 or 0.5MG /DOS, (OZEMPIC, 0.25 OR 0.5 MG/DOSE,) 2 MG/1.5ML SOPN    Sig: Inject 0.5  mg into the skin once a week.    Dispense:  1.5 mL    Refill:  0     Follow-up: Return in about 2 months (around 10/19/2020) for Diabetes follow-up.    Beatrice Lecher, MD

## 2020-08-24 ENCOUNTER — Other Ambulatory Visit: Payer: Self-pay | Admitting: Family Medicine

## 2020-08-24 NOTE — Telephone Encounter (Signed)
Attempted to refill medication but this is not on his formulary. Will fwd to pcp for review.

## 2020-09-11 ENCOUNTER — Other Ambulatory Visit: Payer: Self-pay | Admitting: Family Medicine

## 2020-10-21 ENCOUNTER — Ambulatory Visit: Payer: BC Managed Care – PPO | Admitting: Family Medicine

## 2020-11-07 ENCOUNTER — Encounter: Payer: Self-pay | Admitting: Family Medicine

## 2020-11-07 ENCOUNTER — Ambulatory Visit (INDEPENDENT_AMBULATORY_CARE_PROVIDER_SITE_OTHER): Payer: BC Managed Care – PPO | Admitting: Family Medicine

## 2020-11-07 ENCOUNTER — Other Ambulatory Visit: Payer: Self-pay

## 2020-11-07 VITALS — BP 132/72 | HR 88 | Ht 70.0 in | Wt 228.0 lb

## 2020-11-07 DIAGNOSIS — I10 Essential (primary) hypertension: Secondary | ICD-10-CM

## 2020-11-07 DIAGNOSIS — E119 Type 2 diabetes mellitus without complications: Secondary | ICD-10-CM | POA: Diagnosis not present

## 2020-11-07 DIAGNOSIS — Z566 Other physical and mental strain related to work: Secondary | ICD-10-CM | POA: Diagnosis not present

## 2020-11-07 MED ORDER — SEMAGLUTIDE (1 MG/DOSE) 4 MG/3ML ~~LOC~~ SOPN
1.0000 mg | PEN_INJECTOR | SUBCUTANEOUS | 1 refills | Status: DC
Start: 2020-11-07 — End: 2021-03-24

## 2020-11-07 NOTE — Assessment & Plan Note (Signed)
Uncontrolled though it sounds like the sugars are significantly improved he is tolerating the Ozempic well.  No change in his weight though.  We discussed continuing to work on healthy food choices and really portion controlling his carbs he says some weeks he does better in other weeks he really struggles with that.  Increase Ozempic to 1 mg daily since he is tolerating it well.  Plan to follow-up in 6 to 7 weeks so we can repeat an A1c at that time and expecting it to be much better.  We will discontinue his Kombiglyze once he runs out of it.  And then we can hopefully continue to titrate the Ozempic depending on how he is doing.  We discussed how the onglyza component of the Kombiglyze is a duplication of the effect of the Ozempic.  We could always consider retrying a really low dose of metformin but he did not do well on it by itself.  Also consider maybe adding Wilder Glade if needed.

## 2020-11-07 NOTE — Assessment & Plan Note (Signed)
BP is a little borderline today we will continue to keep an eye on it.  I would love to see him lose about 20 pounds that would reduce blood sugars as well as blood pressure but we will continue to monitor for now.

## 2020-11-07 NOTE — Progress Notes (Addendum)
Established Patient Office Visit  Subjective:  Patient ID: Nicholas Christian, male    DOB: 05-Jan-1966  Age: 56 y.o. MRN: [id number]  CC:  Chief Complaint  Patient presents with   Diabetes    HPI Nicholas Christian presents for 6-week follow-up.  Recent hemoglobin A1c was significantly elevated at 9.9.  Diabetes - no hypoglycemic events. No wounds or sores that are not healing well. No increased thirst or urination. Checking glucose at home. Taking medications as prescribed without any side effects.  He has been doing well on Ozempic and is up to 0.5 mg daily he is still taking Kombiglyze because he just got a 90-day refill.  Also on an ACE inhibitor and a statin.  He has not had any side effects with the Ozempic no nausea etc.  He did not tolerate metformin by itself.  He also completed a PHQ-9 and GAD-7 today for his mood.  PHQ-9 score was 10 and GAD-7 score was 9.  He says work is just been particularly stressful and has been working a lot of long hours and working a very early shift which is made it a little challenging.  He is not currently on prescription medication for mood.  Past Medical History:  Diagnosis Date   Complication of anesthesia    low O2 sat after kidney surgery ~ 2009   DDD (degenerative disc disease), cervical    Diabetes mellitus without complication (HCC)    Diverticulitis    Eosinophilic esophagitis    hx of- [facility]   GERD (gastroesophageal reflux disease)    High triglycerides    History of hiatal hernia    History of kidney stones    hx   Low HDL (under 40)    Low testosterone    Migraines    Normal cardiac stress test    DOBUTAMINE STRESS ECHO 10 years ago    OSA (obstructive sleep apnea)    no cpap used   Pneumonia 10/15   no problems now    Past Surgical History:  Procedure Laterality Date   EDG  01/03/12   ESOPHAGEAL DILATION  07/18/11   Dr. Tora Duck   KIDNEY STONE SURGERY     laser at [facility] MICRODISCECTOMY Left 01/18/2014   Procedure: Left Thoracic five-six microdiskectomy;  Surgeon: Newman Pies, MD;  Location: [facility];  Service: Neurosurgery;  Laterality: Left;   NASAL SINUS SURGERY     Removal polyps    TONSILLECTOMY AND ADENOIDECTOMY     TYMPANOSTOMY TUBE PLACEMENT      Family History  Problem Relation Age of Onset   Prostate cancer Father        oldr when dx    Diabetes Maternal Grandmother     Social History   Socioeconomic History   Marital status: Married    Spouse name: Not on file   Number of children: 3   Years of education: Not on file   Highest education level: Not on file  Occupational History   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   Tobacco Use   Smoking status: Never   Smokeless tobacco: Never  Vaping Use   Vaping Use: Never used  Substance and Sexual Activity   Alcohol use: No   Drug use: No   Sexual activity: Yes    Partners: Female  Other Topics Concern   Not on file  Social History Narrative   No regular  exercise. 1 caffeine /soda per day.    Social Determinants of Health   Financial Resource Strain: Not on file  Food Insecurity: Not on file  Transportation Needs: Not on file  Physical Activity: Not on file  Stress: Not on file  Social Connections: Not on file  Intimate Partner Violence: Not on file    Outpatient Medications Prior to Visit  Medication Sig Dispense Refill   [place] Medication Name: Glucometer and strips and lancet to check once a day.  Dx 250.00 1 Units 11   amphetamine-dextroamphetamine (ADDERALL) 10 MG tablet Take 10 mg by mouth daily.     atorvastatin (LIPITOR) 20 MG tablet TAKE 1 TABLET(20 MG) BY MOUTH QHS 90 tablet 3   cholecalciferol (VITAMIN D3) 25 MCG (1000 UT) tablet Take 1,000 Units by mouth daily.     Insulin Pen Needle (PEN NEEDLES) 31G X 8 MM MISC Use for administraiton of Victoza daily. 100 each 2    lisinopril (ZESTRIL) 10 MG tablet TAKE 1 TABLET(10 MG) BY MOUTH DAILY 90 tablet 1   omeprazole (PRILOSEC) 40 MG capsule Take 1 capsule by mouth.     triamcinolone cream (KENALOG) 0.1 % Apply 1 application topically 2 (two) times daily. 30 g 0   KOMBIGLYZE XR 06-998 MG TB24 TAKE 1 TABLET BY MOUTH DAILY 90 tablet 1   Semaglutide,0.25 or 0.5MG /DOS, (OZEMPIC, 0.25 OR 0.5 MG/DOSE,) 2 MG/1.5ML SOPN INJECT 0.5 MG INTO THE SKIN ONCE A WEEK. 1.5 mL 2   No facility-administered medications prior to visit.    Allergies  Allergen Reactions   Januvia [Sitagliptin] Other (See Comments)    sulfurous burps and nausea.    Niacin Other (See Comments)    Reaction not recalled    ROS Review of Systems    Objective:    Physical Exam Constitutional:      Appearance: Normal appearance. He is well-developed.  HENT:     Head: Normocephalic and atraumatic.  Cardiovascular:     Rate and Rhythm: Normal rate and regular rhythm.     Heart sounds: Normal heart sounds.  Pulmonary:     Effort: Pulmonary effort is normal.     Breath sounds: Normal breath sounds.  Skin:    General: Skin is warm and dry.  Neurological:     Mental Status: He is alert and oriented to person, place, and time. Mental status is at baseline.  Psychiatric:        Behavior: Behavior normal.    BP 132/72   Pulse 88   Ht 5\' 10"  (1.778 m)   Wt 228 lb (103.4 kg)   SpO2 98%   BMI 32.71 kg/m  Wt Readings from Last 3 Encounters:  11/07/20 228 lb (103.4 kg)  08/18/20 227 lb (103 kg)  07/07/20 227 lb (103 kg)     Health Maintenance Due  Topic Date Due   Hepatitis C Screening  Never done   COVID-19 Vaccine (3 - Booster for Moderna series) 11/26/2019   INFLUENZA VACCINE  09/12/2020    There are no preventive care reminders to display for this patient.  Lab Results  Component Value Date   TSH 1.80 12/23/2018   Lab Results  Component Value Date   WBC 6.7 06/16/2018   HGB 16.0 06/16/2018   HCT 47.2 06/16/2018   MCV  78.9 (L) 06/16/2018   PLT 199 06/16/2018   Lab Results  Component Value Date   NA 139 07/07/2020   K 4.2 07/07/2020   CO2  27 07/07/2020   GLUCOSE 203 (H) 07/07/2020   BUN 15 07/07/2020   CREATININE 1.13 07/07/2020   BILITOT 0.7 11/27/2019   ALKPHOS 50 04/22/2018   AST 22 11/27/2019   ALT 27 11/27/2019   PROT 6.8 11/27/2019   ALBUMIN 3.4 (L) 04/22/2018   CALCIUM 9.4 07/07/2020   ANIONGAP 9 04/22/2018   Lab Results  Component Value Date   CHOL 115 08/27/2019   Lab Results  Component Value Date   HDL 36 (L) 08/27/2019   Lab Results  Component Value Date   LDLCALC 59 08/27/2019   Lab Results  Component Value Date   TRIG 122 08/27/2019   Lab Results  Component Value Date   CHOLHDL 3.2 08/27/2019   Lab Results  Component Value Date   HGBA1C 9.9 (A) 08/18/2020      Assessment & Plan:   Problem List Items Addressed This Visit       Cardiovascular and Mediastinum   ESSENTIAL HYPERTENSION, BENIGN - Primary    BP is a little borderline today we will continue to keep an eye on it.  I would love to see him lose about 20 pounds that would reduce blood sugars as well as blood pressure but we will continue to monitor for now.        Endocrine   Controlled type 2 diabetes mellitus without complication, without long-term current use of insulin (HCC)    Uncontrolled though it sounds like the sugars are significantly improved he is tolerating the Ozempic well.  No change in his weight though.  We discussed continuing to work on healthy food choices and really portion controlling his carbs he says some weeks he does better in other weeks he really struggles with that.  Increase Ozempic to 1 mg daily since he is tolerating it well.  Plan to follow-up in 6 to 7 weeks so we can repeat an A1c at that time and expecting it to be much better.  We will discontinue his Kombiglyze once he runs out of it.  And then we can hopefully continue to titrate the Ozempic depending on how he is  doing.  We discussed how the onglyza component of the Kombiglyze is a duplication of the effect of the Ozempic.  We could always consider retrying a really low dose of metformin but he did not do well on it by itself.  Also consider maybe adding Wilder Glade if needed.      Relevant Medications   Semaglutide, 1 MG/DOSE, 4 MG/3ML SOPN     Other   Work stress    We did because he did have some significant scores on the PHQ-9 and GAD-7 I did encourage him to reach out if at any point he feels like it could be helpful for [place] to intervene or address treatment options.  He says for now he is fine.       Meds ordered this encounter  Medications   Semaglutide, 1 MG/DOSE, 4 MG/3ML SOPN    Sig: Inject 1 mg as directed once a week.    Dispense:  3 mL    Refill:  1    Follow-up: Return in about 7 weeks (around 12/26/2020) for Diabetes follow-up.    Beatrice Lecher, MD

## 2020-11-08 NOTE — Assessment & Plan Note (Signed)
We did because he did have some significant scores on the PHQ-9 and GAD-7 I did encourage him to reach out if at any point he feels like it could be helpful for [place] to intervene or address treatment options.  He says for now he is fine.

## 2020-12-26 ENCOUNTER — Ambulatory Visit: Payer: BC Managed Care – PPO | Admitting: Family Medicine

## 2020-12-26 DIAGNOSIS — J0110 Acute frontal sinusitis, unspecified: Secondary | ICD-10-CM | POA: Diagnosis not present

## 2020-12-26 DIAGNOSIS — Z6832 Body mass index (BMI) 32.0-32.9, adult: Secondary | ICD-10-CM | POA: Diagnosis not present

## 2021-01-12 ENCOUNTER — Ambulatory Visit (INDEPENDENT_AMBULATORY_CARE_PROVIDER_SITE_OTHER): Payer: BC Managed Care – PPO

## 2021-01-12 ENCOUNTER — Other Ambulatory Visit: Payer: Self-pay

## 2021-01-12 ENCOUNTER — Ambulatory Visit (INDEPENDENT_AMBULATORY_CARE_PROVIDER_SITE_OTHER): Payer: BC Managed Care – PPO | Admitting: Family Medicine

## 2021-01-12 ENCOUNTER — Encounter: Payer: Self-pay | Admitting: Family Medicine

## 2021-01-12 VITALS — BP 116/78 | HR 99 | Ht 70.0 in | Wt 229.0 lb

## 2021-01-12 DIAGNOSIS — R200 Anesthesia of skin: Secondary | ICD-10-CM | POA: Diagnosis not present

## 2021-01-12 DIAGNOSIS — M79604 Pain in right leg: Secondary | ICD-10-CM

## 2021-01-12 DIAGNOSIS — I10 Essential (primary) hypertension: Secondary | ICD-10-CM | POA: Diagnosis not present

## 2021-01-12 DIAGNOSIS — Z125 Encounter for screening for malignant neoplasm of prostate: Secondary | ICD-10-CM | POA: Diagnosis not present

## 2021-01-12 DIAGNOSIS — E119 Type 2 diabetes mellitus without complications: Secondary | ICD-10-CM

## 2021-01-12 DIAGNOSIS — M79605 Pain in left leg: Secondary | ICD-10-CM

## 2021-01-12 DIAGNOSIS — R051 Acute cough: Secondary | ICD-10-CM

## 2021-01-12 DIAGNOSIS — M545 Low back pain, unspecified: Secondary | ICD-10-CM | POA: Diagnosis not present

## 2021-01-12 LAB — POCT GLYCOSYLATED HEMOGLOBIN (HGB A1C): Hemoglobin A1C: 7.9 % — AB (ref 4.0–5.6)

## 2021-01-12 MED ORDER — PREDNISONE 20 MG PO TABS: 40.0000 mg | ORAL_TABLET | Freq: Every day | ORAL | 0 refills | Status: DC

## 2021-01-12 NOTE — Progress Notes (Signed)
Established Patient Office Visit  Subjective:  Patient ID: Nicholas Christian, male    DOB: 03/29/1965  Age: 55 y.o. MRN: [id number]  CC:  Chief Complaint  Patient presents with   Diabetes   Hypertension   Numbness    Pt reports that he continues to experience numbness and pain bilaterally in his thighs. He stated that he has informed Dr. Madilyn Fireman of this previously     HPI Nicholas Christian presents for   Hypertension- Pt denies chest pain, SOB, dizziness, or heart palpitations.  Taking meds as directed w/o problems.  Denies medication side effects.    Diabetes - no hypoglycemic events. No wounds or sores that are not healing well. No increased thirst or urination. Checking glucose at home. Taking medications as prescribed without any side effects.  He just started the 1 mg dose on the Ozempic about a week ago so far has been tolerating it well he has noticed that it has been helping to curb his appetite.  He also reports that he is trying to get over a sinus infection.  He has had congestion and a cough for several weeks now he just finished his antibiotics about a week ago and says it is better than it was but he still waking up with a lot of congestion first thing in the morning and then it seems to get a little bit better during the day.  He feels like his chest is a little tight.  No wheezing.  Says he is also been sneezing a lot.  He also wanted to follow-up on the numbness that he tends to get in both legs from the outer hip area to the knee bilaterally.  He says over the last several months the pain in his right thigh has become more of a burning type pain to the point where he will put ice packs on his leg and it sometimes disturbs his sleep.  On the left is still just more of a numbness that is present.  He has not had any change in back pain he has had a prior history of thoracic back surgery from degenerative disks.  Recent injury or trauma.  Past Medical History:   Diagnosis Date   Complication of anesthesia    low O2 sat after kidney surgery ~ 2009   DDD (degenerative disc disease), cervical    Diabetes mellitus without complication (HCC)    Diverticulitis    Eosinophilic esophagitis    hx of- [facility]   GERD (gastroesophageal reflux disease)    High triglycerides    History of hiatal hernia    History of kidney stones    hx   Low HDL (under 40)    Low testosterone    Migraines    Normal cardiac stress test    DOBUTAMINE STRESS ECHO 10 years ago    OSA (obstructive sleep apnea)    no cpap used   Pneumonia 10/15   no problems now    Past Surgical History:  Procedure Laterality Date   EDG  01/03/12   ESOPHAGEAL DILATION  07/18/11   Dr. Tora Duck   KIDNEY STONE SURGERY     laser at [facility] MICRODISCECTOMY Left 01/18/2014   Procedure: Left Thoracic five-six microdiskectomy;  Surgeon: Newman Pies, MD;  Location: [facility];  Service: Neurosurgery;  Laterality: Left;   NASAL SINUS SURGERY     Removal polyps    TONSILLECTOMY AND ADENOIDECTOMY  TYMPANOSTOMY TUBE PLACEMENT      Family History  Problem Relation Age of Onset   Prostate cancer Father        oldr when dx    Diabetes Maternal Grandmother     Social History   Socioeconomic History   Marital status: Married    Spouse name: Not on file   Number of children: 3   Years of education: Not on file   Highest education level: Not on file  Occupational History   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   Tobacco Use   Smoking status: Never   Smokeless tobacco: Never  Vaping Use   Vaping Use: Never used  Substance and Sexual Activity   Alcohol use: No   Drug use: No   Sexual activity: Yes    Partners: Female  Other Topics Concern   Not on file  Social History Narrative   No regular exercise. 1 caffeine /soda per day.    Social Determinants of Health   Financial Resource Strain: Not on file   Food Insecurity: Not on file  Transportation Needs: Not on file  Physical Activity: Not on file  Stress: Not on file  Social Connections: Not on file  Intimate Partner Violence: Not on file    Outpatient Medications Prior to Visit  Medication Sig Dispense Refill   [place] Medication Name: Glucometer and strips and lancet to check once a day.  Dx 250.00 1 Units 11   atorvastatin (LIPITOR) 20 MG tablet TAKE 1 TABLET(20 MG) BY MOUTH QHS 90 tablet 3   cholecalciferol (VITAMIN D3) 25 MCG (1000 UT) tablet Take 1,000 Units by mouth daily.     Insulin Pen Needle (PEN NEEDLES) 31G X 8 MM MISC Use for administraiton of Victoza daily. 100 each 2   lisinopril (ZESTRIL) 10 MG tablet TAKE 1 TABLET(10 MG) BY MOUTH DAILY 90 tablet 1   omeprazole (PRILOSEC) 40 MG capsule Take 1 capsule by mouth.     Semaglutide, 1 MG/DOSE, 4 MG/3ML SOPN Inject 1 mg as directed once a week. 3 mL 1   triamcinolone cream (KENALOG) 0.1 % Apply 1 application topically 2 (two) times daily. 30 g 0   amphetamine-dextroamphetamine (ADDERALL) 10 MG tablet Take 10 mg by mouth daily.     No facility-administered medications prior to visit.    Allergies  Allergen Reactions   Januvia [Sitagliptin] Other (See Comments)    sulfurous burps and nausea.    Niacin Other (See Comments)    Reaction not recalled    ROS Review of Systems    Objective:    Physical Exam Constitutional:      Appearance: Normal appearance. He is well-developed.  HENT:     Head: Normocephalic and atraumatic.  Cardiovascular:     Rate and Rhythm: Normal rate and regular rhythm.     Heart sounds: Normal heart sounds.  Pulmonary:     Effort: Pulmonary effort is normal.     Breath sounds: Normal breath sounds.  Musculoskeletal:     Comments: Nontender over the lumbar spine or SI joints.  Negative straight leg raise bilaterally.  Skin:    General: Skin is warm and dry.  Neurological:     Mental Status: He is alert  and oriented to person, place, and time. Mental status is at baseline.  Psychiatric:        Behavior: Behavior normal.   BP 116/78   Pulse 99   Ht 5\' 10"  (  1.778 m)   Wt 229 lb (103.9 kg)   SpO2 96%   BMI 32.86 kg/m  Wt Readings from Last 3 Encounters:  01/12/21 229 lb (103.9 kg)  11/07/20 228 lb (103.4 kg)  08/18/20 227 lb (103 kg)     Health Maintenance Due  Topic Date Due   Hepatitis C Screening  Never done   Pneumococcal Vaccine 87-36 Years old (2 - PCV) 02/16/2012    There are no preventive care reminders to display for this patient.  Lab Results  Component Value Date   TSH 1.80 12/23/2018   Lab Results  Component Value Date   WBC 6.7 06/16/2018   HGB 16.0 06/16/2018   HCT 47.2 06/16/2018   MCV 78.9 (L) 06/16/2018   PLT 199 06/16/2018   Lab Results  Component Value Date   NA 139 07/07/2020   K 4.2 07/07/2020   CO2 27 07/07/2020   GLUCOSE 203 (H) 07/07/2020   BUN 15 07/07/2020   CREATININE 1.13 07/07/2020   BILITOT 0.7 11/27/2019   ALKPHOS 50 04/22/2018   AST 22 11/27/2019   ALT 27 11/27/2019   PROT 6.8 11/27/2019   ALBUMIN 3.4 (L) 04/22/2018   CALCIUM 9.4 07/07/2020   ANIONGAP 9 04/22/2018   Lab Results  Component Value Date   CHOL 115 08/27/2019   Lab Results  Component Value Date   HDL 36 (L) 08/27/2019   Lab Results  Component Value Date   LDLCALC 59 08/27/2019   Lab Results  Component Value Date   TRIG 122 08/27/2019   Lab Results  Component Value Date   CHOLHDL 3.2 08/27/2019   Lab Results  Component Value Date   HGBA1C 7.9 (A) 01/12/2021      Assessment & Plan:   Problem List Items Addressed This Visit       Cardiovascular and Mediastinum   ESSENTIAL HYPERTENSION, BENIGN    Well controlled. Continue current regimen. Follow up in  83mo       Relevant Orders   COMPLETE METABOLIC PANEL WITH GFR   Lipid panel   PSA     Endocrine   Controlled type 2 diabetes mellitus without complication, without long-term current  use of insulin ([place]) - Primary    Uncontrolled but improved. A1C today is 7.9.  Continue to work on [employer] and exercise.        Relevant Orders   POCT glycosylated hemoglobin (Hb A1C) (Completed)   COMPLETE METABOLIC PANEL WITH GFR   Lipid panel   PSA   Other Visit Diagnoses     Screening for prostate cancer       Relevant Orders   PSA   Bilateral leg pain       Relevant Orders   DG Lumbar Spine Complete   Acute cough       Relevant Medications   predniSONE (DELTASONE) 20 MG tablet       Bilateral leg pain-possible bilateral sciatica worse on the right compared to the left or meralgia paresthetica.  I like to at least get some plain lumbar films.  Consider nerve conduction testing for further evaluation and or Ortho referral.  Discussed these options with him today.  Still has a persistent cough and nasal congestion I do not feel like he still has a bacterial infection present.  So organ to do a trial of prednisone to see if this helps.  He is no longer taking any cough or cold medicines.  Call if not better in 1 week.  Meds ordered this encounter  Medications   predniSONE (DELTASONE) 20 MG tablet    Sig: Take 2 tablets (40 mg total) by mouth daily with breakfast.    Dispense:  10 tablet    Refill:  0     Follow-up: Return in about 3 months (around 04/17/2021) for Diabetes follow-up.    Beatrice Lecher, MD

## 2021-01-12 NOTE — Assessment & Plan Note (Signed)
Uncontrolled but improved. A1C today is 7.9.  Continue to work on [employer] and exercise.

## 2021-01-12 NOTE — Assessment & Plan Note (Signed)
Well controlled. Continue current regimen. Follow up in  6 mo  

## 2021-01-15 ENCOUNTER — Encounter: Payer: Self-pay | Admitting: Family Medicine

## 2021-01-15 ENCOUNTER — Encounter: Payer: Self-pay | Admitting: Emergency Medicine

## 2021-01-15 ENCOUNTER — Other Ambulatory Visit: Payer: Self-pay

## 2021-01-15 ENCOUNTER — Emergency Department
Admission: EM | Admit: 2021-01-15 | Discharge: 2021-01-15 | Disposition: A | Discharge status: Home / Self Care | Payer: BC Managed Care – PPO | Source: Home / Self Care

## 2021-01-15 DIAGNOSIS — J101 Influenza due to other identified influenza virus with other respiratory manifestations: Secondary | ICD-10-CM

## 2021-01-15 DIAGNOSIS — R051 Acute cough: Secondary | ICD-10-CM

## 2021-01-15 DIAGNOSIS — R509 Fever, unspecified: Secondary | ICD-10-CM | POA: Diagnosis not present

## 2021-01-15 DIAGNOSIS — J309 Allergic rhinitis, unspecified: Secondary | ICD-10-CM

## 2021-01-15 LAB — POC SARS CORONAVIRUS 2 AG -  ED: SARS Coronavirus 2 Ag: NEGATIVE

## 2021-01-15 LAB — POCT INFLUENZA A/B
Influenza A, POC: POSITIVE — AB
Influenza B, POC: NEGATIVE

## 2021-01-15 MED ORDER — OSELTAMIVIR PHOSPHATE 75 MG PO CAPS: 75.0000 mg | ORAL_CAPSULE | Freq: Two times a day (BID) | ORAL | 0 refills | Status: DC

## 2021-01-15 MED ORDER — FEXOFENADINE HCL 180 MG PO TABS: 180.0000 mg | ORAL_TABLET | Freq: Every day | ORAL | 0 refills | Status: DC

## 2021-01-15 MED ORDER — BENZONATATE 200 MG PO CAPS: 200.0000 mg | ORAL_CAPSULE | Freq: Three times a day (TID) | ORAL | 0 refills | Status: AC | PRN

## 2021-01-15 NOTE — ED Triage Notes (Signed)
Patient presents to Urgent Care with complaints of fever 103 F spiked this morning. Patient reports having fever for 6 weeks ago. Having cough, sob. Was negative for  Strep, Covid, Flu 2 weeks ago. Was given Prednisone, really hasn't helped. Has one more dose left. Taking Dayquil, Nyquil, Tylenol cold and flu

## 2021-01-15 NOTE — Discharge Instructions (Addendum)
Advised patient to take medication as directed with food to completion.  Advised patient to take Allegra with first dose of Tamiflu for the next 5 days.  Advised patient may use Allegra afterwards as needed for concurrent postnasal drainage/drip.  Advised patient may use Tessalon Perles daily or as needed for cough.

## 2021-01-15 NOTE — ED Provider Notes (Signed)
[name]    CSN: [id number] Arrival date & time: 01/15/21  1329      History   Chief Complaint Chief Complaint  Patient presents with   Fever   Cough    HPI Nicholas Christian is a 55 y.o. male.   HPI 55 year old male presents with fever, 3.0 this morning.  Reports fever off and on and cough for 6 weeks.  Reports negative strep negative COVID and negative flu 2 weeks ago.  Past Medical History:  Diagnosis Date   Complication of anesthesia    low O2 sat after kidney surgery ~ 2009   DDD (degenerative disc disease), cervical    Diabetes mellitus without complication (HCC)    Diverticulitis    Eosinophilic esophagitis    hx of- [facility]   GERD (gastroesophageal reflux disease)    High triglycerides    History of hiatal hernia    History of kidney stones    hx   Low HDL (under 40)    Low testosterone    Migraines    Normal cardiac stress test    DOBUTAMINE STRESS ECHO 10 years ago    OSA (obstructive sleep apnea)    no cpap used   Pneumonia 10/15   no problems now    Patient Active Problem List   Diagnosis Date Noted   Work stress 07/07/2020   Inattention 08/27/2019   History of lumbar laminectomy 01/22/2019   Primary osteoarthritis of left hip 12/25/2018   Chronic left-sided low back pain without sciatica 07/14/2018   History of diverticulitis 04/22/2018   Calculus of gallbladder without cholecystitis without obstruction 04/22/2018   Non-recurrent bilateral inguinal hernia without obstruction or gangrene 53/97/6734   Umbilical hernia without obstruction and without gangrene 03/17/2018   History of Clostridium difficile colitis 05/09/2015   Herniated nucleus pulposus, thoracic 19/37/9024   Eosinophilic esophagitis 09/73/5329   Obesity 02/16/2011   Controlled type 2 diabetes mellitus without complication, without long-term current use of insulin ([place]) 02/07/2011   OSA (obstructive sleep apnea) 11/16/2009   ESSENTIAL HYPERTENSION, BENIGN  11/16/2009   BACK PAIN, THORACIC REGION 11/24/2007   DIVERTICULOSIS OF COLON 06/25/2007   Fatty liver disease, nonalcoholic 92/42/6834   GAD (generalized anxiety disorder) 11/20/2005   MIGRAINE, UNSPEC., W/O INTRACTABLE MIGRAINE 11/20/2005   Gastroesophageal reflux disease with esophagitis 11/20/2005   KIDNEY STONE - NEPHROLITHIASIS 11/20/2005    Past Surgical History:  Procedure Laterality Date   EDG  01/03/12   ESOPHAGEAL DILATION  07/18/11   Dr. Tora Duck   KIDNEY STONE SURGERY     laser at [place] MICRODISCECTOMY Left 01/18/2014   Procedure: Left Thoracic five-six microdiskectomy;  Surgeon: Newman Pies, MD;  Location: [facility];  Service: Neurosurgery;  Laterality: Left;   NASAL SINUS SURGERY     Removal polyps    TONSILLECTOMY AND ADENOIDECTOMY     TYMPANOSTOMY TUBE PLACEMENT         Home Medications    Prior to Admission medications   Medication Sig Start Date End Date Taking? Authorizing Provider  AMBULATORY NON FORMULARY MEDICATION Medication Name: Glucometer and strips and lancet to check once a day.  Dx 250.00 12/10/14  Yes Hali Marry, MD  atorvastatin (LIPITOR) 20 MG tablet TAKE 1 TABLET(20 MG) BY MOUTH QHS 07/07/20  Yes Hali Marry, MD  benzonatate (TESSALON) 200 MG capsule Take 1 capsule (200 mg total) by mouth 3 (three) times daily as needed for up to 7  days for cough. 01/15/21 01/22/21 Yes Eliezer Lofts, FNP  cholecalciferol (VITAMIN D3) 25 MCG (1000 UT) tablet Take 1,000 Units by mouth daily.   Yes [provider]  fexofenadine (ALLEGRA ALLERGY) 180 MG tablet Take 1 tablet (180 mg total) by mouth daily for 15 days. 01/15/21 01/30/21 Yes Eliezer Lofts, FNP  Insulin Pen Needle (PEN NEEDLES) 31G X 8 MM MISC Use for administraiton of Victoza daily. 06/27/15  Yes Hali Marry, MD  lisinopril (ZESTRIL) 10 MG tablet TAKE 1 TABLET(10 MG) BY MOUTH DAILY 06/23/20  Yes Hali Marry, MD  omeprazole (PRILOSEC) 40 MG capsule Take 1 capsule by mouth. 09/02/18  Yes [provider]  oseltamivir (TAMIFLU) 75 MG capsule Take 1 capsule (75 mg total) by mouth every 12 (twelve) hours. 01/15/21  Yes Eliezer Lofts, FNP  predniSONE (DELTASONE) 20 MG tablet Take 2 tablets (40 mg total) by mouth daily with breakfast. 01/12/21  Yes Hali Marry, MD  Semaglutide, 1 MG/DOSE, 4 MG/3ML SOPN Inject 1 mg as directed once a week. 11/07/20  Yes Hali Marry, MD  triamcinolone cream (KENALOG) 0.1 % Apply 1 application topically 2 (two) times daily. 12/01/19  Yes Hali Marry, MD    Family History Family History  Problem Relation Age of Onset   Prostate cancer Father        oldr when dx    Diabetes Maternal Grandmother     Social History Social History   Tobacco Use   Smoking status: Never   Smokeless tobacco: Never  Vaping Use   Vaping Use: Never used  Substance Use Topics   Alcohol use: No   Drug use: No     Allergies   Januvia [sitagliptin] and Niacin   Review of Systems Review of Systems  Constitutional:  Positive for fever.  Respiratory:  Positive for cough.   All other systems reviewed and are negative.   Physical Exam Triage Vital Signs ED Triage Vitals  Enc Vitals Group     BP 01/15/21 1501 (!) 149/97     Pulse Rate 01/15/21 1501 (!) 121     Resp 01/15/21 1501 18     Temp 01/15/21 1501 98.8 F (37.1 C)     Temp Source 01/15/21 1501 Oral     SpO2 01/15/21 1501 95 %     Weight --      Height --      Head Circumference --      Peak Flow --      Pain Score 01/15/21 1445 4     Pain Loc --      Pain Edu? --      Excl. in [place]? --    No data found.  Updated Vital Signs BP (!) 149/97 (BP Location: Left Arm)   Pulse (!) 121   Temp 98.8 F (37.1 C) (Oral)   Resp 18   SpO2 95%      Physical Exam Vitals and nursing note reviewed.  Constitutional:      General: He is not in acute distress.    Appearance: He is obese.  He is not ill-appearing.  HENT:     Head: Normocephalic and atraumatic.     Right Ear: Tympanic membrane, ear canal and external ear normal.     Left Ear: Tympanic membrane, ear canal and external ear normal.     Mouth/Throat:     Mouth: Mucous membranes are moist.     Pharynx: Oropharynx is clear.  Comments: Moderate amount of clear drainage of posterior oropharynx noted Eyes:     Extraocular Movements: Extraocular movements intact.     Conjunctiva/sclera: Conjunctivae normal.     Pupils: Pupils are equal, round, and reactive to light.  Cardiovascular:     Rate and Rhythm: Normal rate and regular rhythm.     Pulses: Normal pulses.     Heart sounds: Normal heart sounds.  Pulmonary:     Effort: Pulmonary effort is normal.     Breath sounds: Normal breath sounds.     Comments: Frequent nonproductive cough noted on exam Musculoskeletal:     Cervical back: Normal range of motion and neck supple.  Skin:    General: Skin is warm and dry.  Neurological:     General: No focal deficit present.     Mental Status: He is alert and oriented to person, place, and time.     UC Treatments / Results  Labs (all labs ordered are listed, but only abnormal results are displayed) Labs Reviewed  POCT INFLUENZA A/B - Abnormal; Notable for the following components:      Result Value   Influenza A, POC Positive (*)    All other components within normal limits  POC SARS CORONAVIRUS 2 AG -  ED - Normal    EKG   Radiology No results found.  Procedures Procedures (including critical care time)  Medications Ordered in UC Medications - No data to display  Initial Impression / Assessment and Plan / UC Course  I have reviewed the triage vital signs and the nursing notes.  Pertinent labs & imaging results that were available during my care of the patient were reviewed by me and considered in my medical decision making (see chart for details).     DM: 1.  Influenza A-Rx'd Tamiflu; 2.   Cough-Rx'd Tessalon Perles; 3.  Allergic rhinitis-Rx'd Allegra. Advised patient to take medication as directed with food to completion.  Advised patient to take Allegra with first dose of Tamiflu for the next 5 days.  Advised patient may use Allegra afterwards as needed for concurrent postnasal drainage/drip.  Advised patient may use Tessalon Perles daily or as needed for cough.  Work note provided per patient request prior to discharge.  Discharged home, hemodynamically stable. Final Clinical Impressions(s) / UC Diagnoses   Final diagnoses:  Fever, unspecified  Acute cough  Influenza A  Allergic rhinitis, unspecified seasonality, unspecified trigger     Discharge Instructions      Advised patient to take medication as directed with food to completion.  Advised patient to take Allegra with first dose of Tamiflu for the next 5 days.  Advised patient may use Allegra afterwards as needed for concurrent postnasal drainage/drip.  Advised patient may use Tessalon Perles daily or as needed for cough.     ED Prescriptions     Medication Sig Dispense Auth. Provider   oseltamivir (TAMIFLU) 75 MG capsule Take 1 capsule (75 mg total) by mouth every 12 (twelve) hours. 10 capsule Eliezer Lofts, FNP   fexofenadine [facility] ALLERGY) 180 MG tablet Take 1 tablet (180 mg total) by mouth daily for 15 days. 15 tablet Eliezer Lofts, FNP   benzonatate (TESSALON) 200 MG capsule Take 1 capsule (200 mg total) by mouth 3 (three) times daily as needed for up to 7 days for cough. 40 capsule Eliezer Lofts, FNP      PDMP not reviewed this encounter.   Eliezer Lofts, [place] 01/15/21 1557

## 2021-01-18 ENCOUNTER — Other Ambulatory Visit: Payer: Self-pay | Admitting: *Deleted

## 2021-01-18 DIAGNOSIS — M79604 Pain in right leg: Secondary | ICD-10-CM

## 2021-01-18 NOTE — Progress Notes (Signed)
[name], x-ray actually looks pretty good the discs are in alignment so it does not look like there is any herniated disc etc.  This is 100% rule out a smaller herniation but nothing major or large.  I would really like to consider referring you to neurology so they can do a nerve conduction study to figure out where the nerves are being affected.  If you are okay with that please let me know and I am happy to place referral.

## 2021-02-02 ENCOUNTER — Ambulatory Visit (INDEPENDENT_AMBULATORY_CARE_PROVIDER_SITE_OTHER): Payer: BC Managed Care – PPO

## 2021-02-02 ENCOUNTER — Other Ambulatory Visit: Payer: Self-pay

## 2021-02-02 ENCOUNTER — Encounter: Payer: Self-pay | Admitting: Family Medicine

## 2021-02-02 ENCOUNTER — Ambulatory Visit (INDEPENDENT_AMBULATORY_CARE_PROVIDER_SITE_OTHER): Payer: BC Managed Care – PPO | Admitting: Family Medicine

## 2021-02-02 VITALS — BP 156/87 | HR 99 | Temp 98.4°F | Ht 70.0 in | Wt 229.0 lb

## 2021-02-02 DIAGNOSIS — R109 Unspecified abdominal pain: Secondary | ICD-10-CM | POA: Diagnosis not present

## 2021-02-02 DIAGNOSIS — R3129 Other microscopic hematuria: Secondary | ICD-10-CM

## 2021-02-02 DIAGNOSIS — K429 Umbilical hernia without obstruction or gangrene: Secondary | ICD-10-CM | POA: Diagnosis not present

## 2021-02-02 DIAGNOSIS — Z87442 Personal history of urinary calculi: Secondary | ICD-10-CM

## 2021-02-02 DIAGNOSIS — K8020 Calculus of gallbladder without cholecystitis without obstruction: Secondary | ICD-10-CM | POA: Diagnosis not present

## 2021-02-02 DIAGNOSIS — K5730 Diverticulosis of large intestine without perforation or abscess without bleeding: Secondary | ICD-10-CM | POA: Diagnosis not present

## 2021-02-02 MED ORDER — KETOROLAC TROMETHAMINE 60 MG/2ML IM SOLN
60.0000 mg | Freq: Once | INTRAMUSCULAR | Status: AC
Start: 2021-02-02 — End: 2021-02-02
  Administered 2021-02-02: 16:00:00 60 mg via INTRAMUSCULAR

## 2021-02-02 MED ORDER — HYDROCODONE-ACETAMINOPHEN 10-325 MG PO TABS: 1.0000 | ORAL_TABLET | Freq: Four times a day (QID) | ORAL | 0 refills | Status: AC | PRN

## 2021-02-02 MED ORDER — TAMSULOSIN HCL 0.4 MG PO CAPS: 0.4000 mg | ORAL_CAPSULE | Freq: Every day | ORAL | 3 refills | Status: DC

## 2021-02-02 MED ORDER — KETOROLAC TROMETHAMINE 60 MG/2ML IM SOLN: 60.0000 mg | Freq: Once | INTRAMUSCULAR | Status: DC

## 2021-02-02 NOTE — Progress Notes (Signed)
Acute Office Visit  Subjective:    Patient ID: Nicholas Christian, male    DOB: Nov 15, 1965, 55 y.o.   MRN: [id number]  Chief Complaint  Patient presents with   possible kidney stone    HPI Patient is in today for a kidney stone.  He has a history of kidney stones.  He says it started initially with bilateral mid back pain he woke up with it over the weekend and by Monday it felt like it was significantly worse.  He says usually by now he is passed the stone.  He does have a little bit of oxycodone at home that he uses occasionally but tends to get a rebound headache when he stops it.  The pain is little bit worse on his left flank currently.  He has not member sleep the last couple nights because of the discomfort he says the only thing that seems to help is if he gets up and moves and paces.  He has noticed a little bit of intermittent hematuria.  No fevers chills or sweats.  He said the only thing a little unusual this time with his kidney stones is that he is having some pain going down the back of both legs.  He does not have it anymore Flomax and has not been able to take that for this episode.  Past Medical History:  Diagnosis Date   Complication of anesthesia    low O2 sat after kidney surgery ~ 2009   DDD (degenerative disc disease), cervical    Diabetes mellitus without complication (HCC)    Diverticulitis    Eosinophilic esophagitis    hx of- [facility]   GERD (gastroesophageal reflux disease)    High triglycerides    History of hiatal hernia    History of kidney stones    hx   Low HDL (under 40)    Low testosterone    Migraines    Normal cardiac stress test    DOBUTAMINE STRESS ECHO 10 years ago    OSA (obstructive sleep apnea)    no cpap used   Pneumonia 10/15   no problems now    Past Surgical History:  Procedure Laterality Date   EDG  01/03/12   ESOPHAGEAL DILATION  07/18/11   Dr. Tora Duck   KIDNEY STONE SURGERY     laser at [place] MICRODISCECTOMY Left 01/18/2014   Procedure: Left Thoracic five-six microdiskectomy;  Surgeon: Newman Pies, MD;  Location: [facility];  Service: Neurosurgery;  Laterality: Left;   NASAL SINUS SURGERY     Removal polyps    TONSILLECTOMY AND ADENOIDECTOMY     TYMPANOSTOMY TUBE PLACEMENT      Family History  Problem Relation Age of Onset   Prostate cancer Father        oldr when dx    Diabetes Maternal Grandmother     Social History   Socioeconomic History   Marital status: Married    Spouse name: Not on file   Number of children: 3   Years of education: Not on file   Highest education level: Not on file  Occupational History   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   Tobacco Use   Smoking status: Never   Smokeless tobacco: Never  Vaping Use   Vaping Use: Never used  Substance and Sexual Activity   Alcohol use: No   Drug use: No   Sexual activity: Yes  Partners: Female  Other Topics Concern   Not on file  Social History Narrative   No regular exercise. 1 caffeine /soda per day.    Social Determinants of Health   Financial Resource Strain: Not on file  Food Insecurity: Not on file  Transportation Needs: Not on file  Physical Activity: Not on file  Stress: Not on file  Social Connections: Not on file  Intimate Partner Violence: Not on file    Outpatient Medications Prior to Visit  Medication Sig Dispense Refill   [place] Medication Name: Glucometer and strips and lancet to check once a day.  Dx 250.00 1 Units 11   atorvastatin (LIPITOR) 20 MG tablet TAKE 1 TABLET(20 MG) BY MOUTH QHS 90 tablet 3   cholecalciferol (VITAMIN D3) 25 MCG (1000 UT) tablet Take 1,000 Units by mouth daily.     fexofenadine (ALLEGRA ALLERGY) 180 MG tablet Take 1 tablet (180 mg total) by mouth daily for 15 days. 15 tablet 0   Insulin Pen Needle (PEN NEEDLES) 31G X 8 MM MISC Use for administraiton of Victoza  daily. 100 each 2   lisinopril (ZESTRIL) 10 MG tablet TAKE 1 TABLET(10 MG) BY MOUTH DAILY 90 tablet 1   omeprazole (PRILOSEC) 40 MG capsule Take 1 capsule by mouth.     predniSONE (DELTASONE) 20 MG tablet Take 2 tablets (40 mg total) by mouth daily with breakfast. 10 tablet 0   Semaglutide, 1 MG/DOSE, 4 MG/3ML SOPN Inject 1 mg as directed once a week. 3 mL 1   triamcinolone cream (KENALOG) 0.1 % Apply 1 application topically 2 (two) times daily. 30 g 0   oseltamivir (TAMIFLU) 75 MG capsule Take 1 capsule (75 mg total) by mouth every 12 (twelve) hours. 10 capsule 0   No facility-administered medications prior to visit.    Allergies  Allergen Reactions   Januvia [Sitagliptin] Other (See Comments)    sulfurous burps and nausea.    Niacin Other (See Comments)    Reaction not recalled    Review of Systems     Objective:    Physical Exam Vitals reviewed.  Constitutional:      Appearance: He is well-developed.  HENT:     Head: Normocephalic and atraumatic.  Eyes:     Conjunctiva/sclera: Conjunctivae normal.  Cardiovascular:     Rate and Rhythm: Normal rate and regular rhythm.     Heart sounds: Normal heart sounds.  Pulmonary:     Effort: Pulmonary effort is normal.     Breath sounds: Normal breath sounds.  Abdominal:     General: Abdomen is flat. Bowel sounds are normal.     Palpations: There is no mass.  Musculoskeletal:     Comments:  CVA tenderness   Skin:    General: Skin is warm and dry.     Coloration: Skin is not pale.  Neurological:     Mental Status: He is alert and oriented to person, place, and time.  Psychiatric:        Behavior: Behavior normal.    BP (!) 156/87    Pulse 99    Temp 98.4 F (36.9 C)    Ht 5\' 10"  (1.778 m)    Wt 229 lb (103.9 kg)    SpO2 97%    BMI 32.86 kg/m  Wt Readings from Last 3 Encounters:  02/02/21 229 lb (103.9 kg)  01/12/21 229 lb (103.9 kg)  11/07/20 228 lb (103.4 kg)    Health Maintenance Due  Topic  Date Due   Hepatitis C  Screening  Never done   Pneumococcal Vaccine 55-71 Years old (2 - PCV) 02/16/2012    There are no preventive care reminders to display for this patient.   Lab Results  Component Value Date   TSH 1.80 12/23/2018   Lab Results  Component Value Date   WBC 6.7 06/16/2018   HGB 16.0 06/16/2018   HCT 47.2 06/16/2018   MCV 78.9 (L) 06/16/2018   PLT 199 06/16/2018   Lab Results  Component Value Date   NA 139 07/07/2020   K 4.2 07/07/2020   CO2 27 07/07/2020   GLUCOSE 203 (H) 07/07/2020   BUN 15 07/07/2020   CREATININE 1.13 07/07/2020   BILITOT 0.7 11/27/2019   ALKPHOS 50 04/22/2018   AST 22 11/27/2019   ALT 27 11/27/2019   PROT 6.8 11/27/2019   ALBUMIN 3.4 (L) 04/22/2018   CALCIUM 9.4 07/07/2020   ANIONGAP 9 04/22/2018   Lab Results  Component Value Date   CHOL 115 08/27/2019   Lab Results  Component Value Date   HDL 36 (L) 08/27/2019   Lab Results  Component Value Date   LDLCALC 59 08/27/2019   Lab Results  Component Value Date   TRIG 122 08/27/2019   Lab Results  Component Value Date   CHOLHDL 3.2 08/27/2019   Lab Results  Component Value Date   HGBA1C 7.9 (A) 01/12/2021       Assessment & Plan:   Problem List Items Addressed This Visit   None Visit Diagnoses     Flank pain with history of urolithiasis    -  Primary   Relevant Medications   ketorolac (TORADOL) injection 60 mg (Completed)   Other Relevant Orders   CT RENAL STONE STUDY (Completed)      Flank Pain -with prior history of kidney stone I strongly suspect nephrolithiasis.  I am concerned though that its been 5 days and it feels like it really has not moved [place] go ahead and get CT stone protocol just to make sure that the stone is small enough that I will be able to pass on its own in the meantime I did send in a prescription for Flomax and hydrocodone and he was given a Toradol injection while here today.  Meds ordered this encounter  Medications   DISCONTD: ketorolac (TORADOL)  injection 60 mg   ketorolac (TORADOL) injection 60 mg   tamsulosin (FLOMAX) 0.4 MG CAPS capsule    Sig: Take 1 capsule (0.4 mg total) by mouth daily.    Dispense:  30 capsule    Refill:  3   HYDROcodone-acetaminophen (NORCO) 10-325 MG tablet    Sig: Take 1 tablet by mouth every 6 (six) hours as needed for up to 5 days.    Dispense:  20 tablet    Refill:  0     Beatrice Lecher, MD

## 2021-02-02 NOTE — Progress Notes (Signed)
[name], they say they don't see one but I still think you have one.  Take the meds and really push fluids and take the Flomax and if you do not pass it over the weekend then please let me know and we can get you back in with your urologist.  You do have a small inguinal and umbilical hernia.  These are not causing your pain but just FYI in case you did not know about them.

## 2021-02-02 NOTE — Telephone Encounter (Signed)
Pt given appt for today at 3:40pm.  Charyl Bigger, CMA

## 2021-02-07 ENCOUNTER — Encounter: Payer: Self-pay | Admitting: Family Medicine

## 2021-02-07 DIAGNOSIS — R1310 Dysphagia, unspecified: Secondary | ICD-10-CM

## 2021-02-16 DIAGNOSIS — K200 Eosinophilic esophagitis: Secondary | ICD-10-CM | POA: Diagnosis not present

## 2021-03-02 DIAGNOSIS — K200 Eosinophilic esophagitis: Secondary | ICD-10-CM | POA: Diagnosis not present

## 2021-03-02 DIAGNOSIS — R1310 Dysphagia, unspecified: Secondary | ICD-10-CM | POA: Diagnosis not present

## 2021-03-09 DIAGNOSIS — R5382 Chronic fatigue, unspecified: Secondary | ICD-10-CM | POA: Diagnosis not present

## 2021-03-09 DIAGNOSIS — F5101 Primary insomnia: Secondary | ICD-10-CM | POA: Diagnosis not present

## 2021-03-09 DIAGNOSIS — Z79890 Hormone replacement therapy (postmenopausal): Secondary | ICD-10-CM | POA: Diagnosis not present

## 2021-03-09 DIAGNOSIS — E291 Testicular hypofunction: Secondary | ICD-10-CM | POA: Diagnosis not present

## 2021-03-09 DIAGNOSIS — Z6831 Body mass index (BMI) 31.0-31.9, adult: Secondary | ICD-10-CM | POA: Diagnosis not present

## 2021-03-23 ENCOUNTER — Encounter: Payer: Self-pay | Admitting: Neurology

## 2021-03-23 ENCOUNTER — Ambulatory Visit: Payer: BC Managed Care – PPO | Admitting: Neurology

## 2021-03-23 ENCOUNTER — Other Ambulatory Visit: Payer: Self-pay

## 2021-03-23 VITALS — BP 123/82 | HR 70 | Ht 70.0 in | Wt 229.0 lb

## 2021-03-23 DIAGNOSIS — M545 Low back pain, unspecified: Secondary | ICD-10-CM | POA: Diagnosis not present

## 2021-03-23 DIAGNOSIS — G5710 Meralgia paresthetica, unspecified lower limb: Secondary | ICD-10-CM | POA: Insufficient documentation

## 2021-03-23 MED ORDER — LIDOCAINE-PRILOCAINE 2.5-2.5 % EX CREA: TOPICAL_CREAM | CUTANEOUS | 11 refills | Status: DC

## 2021-03-23 NOTE — Progress Notes (Signed)
Chief Complaint  Patient presents with   New Patient (Initial Visit)    Rm 15. Alone. PCP is Dr. Beatrice Lecher. NP/Internal referral for bilateral leg pain, numbness and tingling. Pain, numbness, and tingling are located mostly in outer thighs, mainly on the right. States nothing relieves the pain. Pain is worse during prolonged standing.      ASSESSMENT AND PLAN  Nicholas Christian is a 56 y.o. male   Bilateral meralgia paresthetica  In the setting of obesity, poorly controlled diabetes,  He was given prescription of gabapentin, Lyrica, does not want to take any medications, Emla gel, may mix with diclofenac gel as needed  Encouraging moderate exercise,  DIAGNOSTIC DATA (LABS, IMAGING, TESTING) - I reviewed patient records, labs, notes, testing and imaging myself where available.  A1C was 7.9 in Dec 2022  MEDICAL HISTORY:  Nicholas Christian, is a 56 year old male, seen in request by primary care physician Dr. Beatrice Lecher for evaluation of bilateral lateral thigh area paresthesia, burning pain, initial evaluation was March 23, 2021   I reviewed and summarized the referring note.PMHX HLD HTN DM since 2019. Lumbar decompression surgery in the past.  He had a history of lumbar decompression surgery in the past, deny radicular pain prior to decompression surgery, surgery has been very successful  He had poorly controlled diabetes since 2019, A1c was up to 11, most recent 12 January 2021 was 7.9, he used to exercise regularly  Around 2019, he began to notice bilateral thigh area numbness, intermittent initially, gradually getting worse, now he has worsening burning pain, especially with prolonged standing, walking, he denies gait abnormality, denies low back pain, denies bowel or bladder incontinence  Personally reviewed x-ray of lumbar spine December 2022 was normal Thoracic MRI in October 2015, mild degenerative changes, no significant foraminal canal  stenosis.   PHYSICAL EXAM:   Vitals:   03/23/21 1347  BP: 123/82  Pulse: 70  Weight: 229 lb (103.9 kg)  Height: 5\' 10"  (1.778 m)   Not recorded     Body mass index is 32.86 kg/m.  PHYSICAL EXAMNIATION:  Gen: NAD, conversant, well nourised, well groomed                     Cardiovascular: Regular rate rhythm, no peripheral edema, warm, nontender. Eyes: Conjunctivae clear without exudates or hemorrhage Neck: Supple, no carotid bruits. Pulmonary: Clear to auscultation bilaterally   NEUROLOGICAL EXAM:  MENTAL STATUS: Speech:    Speech is normal; fluent and spontaneous with normal comprehension.  Cognition:     Orientation to time, place and person     Normal recent and remote memory     Normal Attention span and concentration     Normal Language, naming, repeating,spontaneous speech     Fund of knowledge   CRANIAL NERVES: CN II: Visual fields are full to confrontation. Pupils are round equal and briskly reactive to light. CN III, IV, VI: extraocular movement are normal. No ptosis. CN V: Facial sensation is intact to light touch CN VII: Face is symmetric with normal eye closure  CN VIII: Hearing is normal to causal conversation. CN IX, X: Phonation is normal. CN XI: Head turning and shoulder shrug are intact  MOTOR: There is no pronator drift of out-stretched arms. Muscle bulk and tone are normal. Muscle strength is normal.  REFLEXES: Reflexes are 2+ and symmetric at the biceps, triceps, knees, and ankles. Plantar responses are flexor.  SENSORY: Intact to light touch, pinprick and vibratory  sensation are intact in fingers and toes.  With exception of decreased pinprick at bilateral lateral thigh region, above knee level in the territory of bilateral lateral femoral cutaneous nerve.  COORDINATION: There is no trunk or limb dysmetria noted.  GAIT/STANCE: Posture is normal. Gait is steady with normal steps, base, arm swing, and turning. Heel and toe walking are  normal. Tandem gait is normal.  Romberg is absent.  REVIEW OF SYSTEMS:  Full 14 system review of systems performed and notable only for as above All other review of systems were negative.   ALLERGIES: Allergies  Allergen Reactions   Januvia [Sitagliptin] Other (See Comments)    sulfurous burps and nausea.    Niacin Other (See Comments)    Reaction not recalled    HOME MEDICATIONS: Current Outpatient Medications  Medication Sig Dispense Refill   AMBULATORY NON FORMULARY MEDICATION Medication Name: Glucometer and strips and lancet to check once a day.  Dx 250.00 1 Units 11   atorvastatin (LIPITOR) 20 MG tablet TAKE 1 TABLET(20 MG) BY MOUTH QHS 90 tablet 3   cholecalciferol (VITAMIN D3) 25 MCG (1000 UT) tablet Take 1,000 Units by mouth daily.     Insulin Pen Needle (PEN NEEDLES) 31G X 8 MM MISC Use for administraiton of Victoza daily. 100 each 2   lisinopril (ZESTRIL) 10 MG tablet TAKE 1 TABLET(10 MG) BY MOUTH DAILY 90 tablet 1   omeprazole (PRILOSEC) 40 MG capsule Take 1 capsule by mouth.     Semaglutide, 1 MG/DOSE, 4 MG/3ML SOPN Inject 1 mg as directed once a week. 3 mL 1   triamcinolone cream (KENALOG) 0.1 % Apply 1 application topically 2 (two) times daily. 30 g 0   No current facility-administered medications for this visit.    PAST MEDICAL HISTORY: Past Medical History:  Diagnosis Date   Complication of anesthesia    low O2 sat after kidney surgery ~ 2009   DDD (degenerative disc disease), cervical    Diabetes mellitus without complication (HCC)    Diverticulitis    Eosinophilic esophagitis    hx of- [facility]   GERD (gastroesophageal reflux disease)    High triglycerides    History of hiatal hernia    History of kidney stones    hx   Low HDL (under 40)    Low testosterone    Migraines    Normal cardiac stress test    DOBUTAMINE STRESS ECHO 10 years ago    OSA (obstructive sleep apnea)    no cpap used   Pneumonia 10/15   no problems now    PAST SURGICAL  HISTORY: Past Surgical History:  Procedure Laterality Date   EDG  01/03/12   ESOPHAGEAL DILATION  07/18/11   Dr. Tora Duck   KIDNEY STONE SURGERY     laser at [facility] MICRODISCECTOMY Left 01/18/2014   Procedure: Left Thoracic five-six microdiskectomy;  Surgeon: Newman Pies, MD;  Location: [facility];  Service: Neurosurgery;  Laterality: Left;   NASAL SINUS SURGERY     Removal polyps    TONSILLECTOMY AND ADENOIDECTOMY     TYMPANOSTOMY TUBE PLACEMENT      FAMILY HISTORY: Family History  Problem Relation Age of Onset   Prostate cancer Father        oldr when dx    Diabetes Maternal Grandmother     SOCIAL HISTORY: Social History   Socioeconomic History   Marital status: Married    Spouse name: Not on file  Number of children: 3   Years of education: Not on file   Highest education level: Not on file  Occupational History   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   Tobacco Use   Smoking status: Never   Smokeless tobacco: Never  Vaping Use   Vaping Use: Never used  Substance and Sexual Activity   Alcohol use: No   Drug use: No   Sexual activity: Yes    Partners: Female  Other Topics Concern   Not on file  Social History Narrative   No regular exercise. 1 caffeine /soda per day.    Social Determinants of Health   Financial Resource Strain: Not on file  Food Insecurity: Not on file  Transportation Needs: Not on file  Physical Activity: Not on file  Stress: Not on file  Social Connections: Not on file  Intimate Partner Violence: Not on file      Marcial Pacas, M.D. Ph.D.  [facility] [address] [place], [place] [address] Ph: [phone] Fax: [phone]  CC:  Hali Marry, [address] [place],  [place] [address]  Hali Marry, MD

## 2021-03-23 NOTE — Patient Instructions (Signed)
Voltaren gel 

## 2021-03-24 ENCOUNTER — Other Ambulatory Visit: Payer: Self-pay | Admitting: Family Medicine

## 2021-03-24 DIAGNOSIS — E119 Type 2 diabetes mellitus without complications: Secondary | ICD-10-CM

## 2021-03-28 ENCOUNTER — Other Ambulatory Visit: Payer: Self-pay | Admitting: Family Medicine

## 2021-03-28 DIAGNOSIS — E119 Type 2 diabetes mellitus without complications: Secondary | ICD-10-CM

## 2021-03-30 ENCOUNTER — Encounter: Payer: Self-pay | Admitting: Family Medicine

## 2021-04-03 ENCOUNTER — Telehealth: Payer: Self-pay

## 2021-04-03 NOTE — Telephone Encounter (Signed)
Initiated Prior authorization BOB:OFPULGS (1 MG/DOSE) 4MG /3ML pen-injectors Via: Covermymeds Case/Key:[id number] Status: approved  as of 04/03/21 Reason:Coverage starts: 20-FEB-23 - 20-FEB-24  Notified Pt via: Mychart

## 2021-04-04 ENCOUNTER — Encounter: Payer: Self-pay | Admitting: Family Medicine

## 2021-04-13 ENCOUNTER — Ambulatory Visit (INDEPENDENT_AMBULATORY_CARE_PROVIDER_SITE_OTHER): Payer: BC Managed Care – PPO | Admitting: Family Medicine

## 2021-04-13 ENCOUNTER — Other Ambulatory Visit: Payer: Self-pay

## 2021-04-13 VITALS — BP 119/82 | HR 104 | Temp 98.4°F | Wt 227.0 lb

## 2021-04-13 DIAGNOSIS — E119 Type 2 diabetes mellitus without complications: Secondary | ICD-10-CM | POA: Diagnosis not present

## 2021-04-13 DIAGNOSIS — J069 Acute upper respiratory infection, unspecified: Secondary | ICD-10-CM

## 2021-04-13 DIAGNOSIS — E291 Testicular hypofunction: Secondary | ICD-10-CM | POA: Diagnosis not present

## 2021-04-13 DIAGNOSIS — Z79890 Hormone replacement therapy (postmenopausal): Secondary | ICD-10-CM | POA: Diagnosis not present

## 2021-04-13 DIAGNOSIS — R5382 Chronic fatigue, unspecified: Secondary | ICD-10-CM | POA: Diagnosis not present

## 2021-04-13 LAB — POCT GLYCOSYLATED HEMOGLOBIN (HGB A1C): Hemoglobin A1C: 7.9 % — AB (ref 4.0–5.6)

## 2021-04-13 MED ORDER — PREDNISONE 20 MG PO TABS: 40.0000 mg | ORAL_TABLET | Freq: Every day | ORAL | 0 refills | Status: DC

## 2021-04-13 MED ORDER — SEMAGLUTIDE (2 MG/DOSE) 8 MG/3ML ~~LOC~~ SOPN: 2.0000 mg | PEN_INJECTOR | SUBCUTANEOUS | 1 refills | Status: DC

## 2021-04-13 NOTE — Progress Notes (Signed)
Established Patient Office Visit  Subjective:  Patient ID: Nicholas Christian, male    DOB: 12/05/1965  Age: 56 y.o. MRN: [id number]  CC:  Chief Complaint  Patient presents with   Diabetes   Cough    HPI Mc Bloodworth presents for   Diabetes - no hypoglycemic events. No wounds or sores that are not healing well. No increased thirst or urination. Checking glucose at home. Taking medications as prescribed without any side effects. He has been tolerating the ozempic well.  He did have some significant issues getting it with his insurance.  So he went without it for several weeks but he is back on it.  He also reports a cough and sore throat for the last 4 days.  He has been taking some DayQuil and has run a low-grade temperature the last one being yesterday.  He also reports a sore throat and positive sick contact with a family member.  Past Medical History:  Diagnosis Date   Complication of anesthesia    low O2 sat after kidney surgery ~ 2009   DDD (degenerative disc disease), cervical    Diabetes mellitus without complication (HCC)    Diverticulitis    Eosinophilic esophagitis    hx of- [facility]   GERD (gastroesophageal reflux disease)    High triglycerides    History of hiatal hernia    History of kidney stones    hx   Low HDL (under 40)    Low testosterone    Migraines    Normal cardiac stress test    DOBUTAMINE STRESS ECHO 10 years ago    OSA (obstructive sleep apnea)    no cpap used   Pneumonia 10/15   no problems now    Past Surgical History:  Procedure Laterality Date   EDG  01/03/12   ESOPHAGEAL DILATION  07/18/11   Dr. Tora Duck   KIDNEY STONE SURGERY     laser at [facility] MICRODISCECTOMY Left 01/18/2014   Procedure: Left Thoracic five-six microdiskectomy;  Surgeon: Newman Pies, MD;  Location: [facility];  Service: Neurosurgery;  Laterality: Left;   NASAL SINUS SURGERY     Removal polyps     TONSILLECTOMY AND ADENOIDECTOMY     TYMPANOSTOMY TUBE PLACEMENT      Family History  Problem Relation Age of Onset   Prostate cancer Father        oldr when dx    Diabetes Maternal Grandmother     Social History   Socioeconomic History   Marital status: Married    Spouse name: Not on file   Number of children: 3   Years of education: Not on file   Highest education level: Not on file  Occupational History   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   Tobacco Use   Smoking status: Never   Smokeless tobacco: Never  Vaping Use   Vaping Use: Never used  Substance and Sexual Activity   Alcohol use: No   Drug use: No   Sexual activity: Yes    Partners: Female  Other Topics Concern   Not on file  Social History Narrative   No regular exercise. 1 caffeine /soda per day.    Social Determinants of Health   Financial Resource Strain: Not on file  Food Insecurity: Not on file  Transportation Needs: Not on file  Physical Activity: Not on file  Stress: Not on file  Social Connections:  Not on file  Intimate Partner Violence: Not on file    Outpatient Medications Prior to Visit  Medication Sig Dispense Refill   AMBULATORY NON [place] Medication Name: Glucometer and strips and lancet to check once a day.  Dx 250.00 1 Units 11   atorvastatin (LIPITOR) 20 MG tablet TAKE 1 TABLET(20 MG) BY MOUTH QHS 90 tablet 3   cholecalciferol (VITAMIN D3) 25 MCG (1000 UT) tablet Take 1,000 Units by mouth daily.     Insulin Pen Needle (PEN NEEDLES) 31G X 8 MM MISC Use for administraiton of Victoza daily. 100 each 2   lidocaine-prilocaine (EMLA) cream 1 gram qid prn 30 g 11   lisinopril (ZESTRIL) 10 MG tablet TAKE 1 TABLET(10 MG) BY MOUTH DAILY 90 tablet 1   omeprazole (PRILOSEC) 40 MG capsule Take 1 capsule by mouth.     triamcinolone cream (KENALOG) 0.1 % Apply 1 application topically 2 (two) times daily. 30 g 0   OZEMPIC, 1 MG/DOSE, 4 MG/3ML SOPN INJECT 1  MG AS DIRECTED ONCE A WEEK. 9 mL 0   No facility-administered medications prior to visit.    Allergies  Allergen Reactions   Januvia [Sitagliptin] Other (See Comments)    sulfurous burps and nausea.    Niacin Other (See Comments)    Reaction not recalled    ROS Review of Systems    Objective:    Physical Exam Constitutional:      Appearance: He is well-developed.  HENT:     Head: Normocephalic and atraumatic.     Right Ear: Tympanic membrane, ear canal and external ear normal.     Left Ear: Tympanic membrane, ear canal and external ear normal.     Nose: Nose normal.     Mouth/Throat:     Mouth: Mucous membranes are moist.     Pharynx: Oropharynx is clear. No posterior oropharyngeal erythema.  Eyes:     Conjunctiva/sclera: Conjunctivae normal.     Pupils: Pupils are equal, round, and reactive to light.  Neck:     Thyroid: No thyromegaly.  Cardiovascular:     Rate and Rhythm: Normal rate.     Heart sounds: Normal heart sounds.  Pulmonary:     Effort: Pulmonary effort is normal.     Breath sounds: Normal breath sounds.  Musculoskeletal:     Cervical back: Neck supple.  Lymphadenopathy:     Cervical: No cervical adenopathy.  Skin:    General: Skin is warm and dry.  Neurological:     Mental Status: He is alert and oriented to person, place, and time.  Psychiatric:        Mood and Affect: Mood normal.    BP 119/82    Pulse (!) 104    Temp 98.4 F (36.9 C)    Wt 227 lb (103 kg)    BMI 32.57 kg/m  Wt Readings from Last 3 Encounters:  04/13/21 227 lb (103 kg)  03/23/21 229 lb (103.9 kg)  02/02/21 229 lb (103.9 kg)     Health Maintenance Due  Topic Date Due   Hepatitis C Screening  Never done    There are no preventive care reminders to display for this patient.  Lab Results  Component Value Date   TSH 1.80 12/23/2018   Lab Results  Component Value Date   WBC 6.7 06/16/2018   HGB 16.0 06/16/2018   HCT 47.2 06/16/2018   MCV 78.9 (L) 06/16/2018    PLT 199 06/16/2018   Lab Results  Component Value Date   NA 139 07/07/2020   K 4.2 07/07/2020   CO2 27 07/07/2020   GLUCOSE 203 (H) 07/07/2020   BUN 15 07/07/2020   CREATININE 1.13 07/07/2020   BILITOT 0.7 11/27/2019   ALKPHOS 50 04/22/2018   AST 22 11/27/2019   ALT 27 11/27/2019   PROT 6.8 11/27/2019   ALBUMIN 3.4 (L) 04/22/2018   CALCIUM 9.4 07/07/2020   ANIONGAP 9 04/22/2018   Lab Results  Component Value Date   CHOL 115 08/27/2019   Lab Results  Component Value Date   HDL 36 (L) 08/27/2019   Lab Results  Component Value Date   LDLCALC 59 08/27/2019   Lab Results  Component Value Date   TRIG 122 08/27/2019   Lab Results  Component Value Date   CHOLHDL 3.2 08/27/2019   Lab Results  Component Value Date   HGBA1C 7.9 (A) 04/13/2021      Assessment & Plan:   Problem List Items Addressed This Visit       Endocrine   Controlled type 2 diabetes mellitus without complication, without long-term current use of insulin (HCC) - Primary    A1c not significantly improved.  But he has been out of his Ozempic for several weeks.  We will go ahead and go up to 2 mg as he has been tolerating the 1 mg is fine just really encouraged him to continue to work on healthy food choices, portion control and regular exercise to get his A1c back under 7.  Please follow-up in 3 months.  Medication refill sent to pharmacy.  He is on an ACE inhibitor and statin.      Relevant Medications   Semaglutide, 2 MG/DOSE, 8 MG/3ML SOPN   Other Relevant Orders   POCT glycosylated hemoglobin (Hb A1C) (Completed)   Other Visit Diagnoses     Viral upper respiratory tract infection           Upper respiratory infection-likely viral.  We will treat with prednisone and see if this helps with a lot of the congestion and facial pressure and pain.  If he is not feeling better after the weekend then please give [place] call back.  He is getting ready to leave the country for a trip.  Meds ordered this  encounter  Medications   predniSONE (DELTASONE) 20 MG tablet    Sig: Take 2 tablets (40 mg total) by mouth daily with breakfast.    Dispense:  10 tablet    Refill:  0   Semaglutide, 2 MG/DOSE, 8 MG/3ML SOPN    Sig: Inject 2 mg as directed once a week.    Dispense:  9 mL    Refill:  1    Follow-up: Return in about 3 months (around 07/14/2021) for Diabetes follow-up.    Beatrice Lecher, MD

## 2021-04-13 NOTE — Assessment & Plan Note (Signed)
A1c not significantly improved.  But he has been out of his Ozempic for several weeks.  We will go ahead and go up to 2 mg as he has been tolerating the 1 mg is fine just really encouraged him to continue to work on healthy food choices, portion control and regular exercise to get his A1c back under 7.  Please follow-up in 3 months.  Medication refill sent to pharmacy.  He is on an ACE inhibitor and statin. ?

## 2021-04-15 ENCOUNTER — Encounter: Payer: Self-pay | Admitting: Family Medicine

## 2021-04-16 ENCOUNTER — Telehealth: Payer: BC Managed Care – PPO | Admitting: Family

## 2021-04-16 DIAGNOSIS — B9689 Other specified bacterial agents as the cause of diseases classified elsewhere: Secondary | ICD-10-CM | POA: Diagnosis not present

## 2021-04-16 DIAGNOSIS — J208 Acute bronchitis due to other specified organisms: Secondary | ICD-10-CM

## 2021-04-16 MED ORDER — AZITHROMYCIN 250 MG PO TABS: ORAL_TABLET | ORAL | 0 refills | Status: DC

## 2021-04-16 MED ORDER — BENZONATATE 100 MG PO CAPS: 100.0000 mg | ORAL_CAPSULE | Freq: Three times a day (TID) | ORAL | 0 refills | Status: DC | PRN

## 2021-04-16 NOTE — Progress Notes (Signed)
We are sorry that you are not feeling well.  Here is how we plan to help! ? ?Based on your presentation I believe you most likely have A cough due to bacteria.  When patients have a fever and a productive cough with a change in color or increased sputum production, we are concerned about bacterial bronchitis.  If left untreated it can progress to pneumonia.  If your symptoms do not improve with your treatment plan it is important that you contact your provider.   I have prescribed Azithromyin 250 mg: two tablets now and then one tablet daily for 4 additonal days  ?  ?In addition you may use A non-prescription cough medication called Robitussin DAC. Take 2 teaspoons every 8 hours or Delsym: take 2 teaspoons every 12 hours., A non-prescription cough medication called Mucinex DM: take 2 tablets every 12 hours., and A prescription cough medication called Tessalon Perles '100mg'$ . You may take 1-2 capsules every 8 hours as needed for your cough. ? ?Continue your prednisone.  ? ?From your responses in the eVisit questionnaire you describe inflammation in the upper respiratory tract which is causing a significant cough.  This is commonly called Bronchitis and has four common causes:   ?Allergies ?Viral Infections ?Acid Reflux ?Bacterial Infection ?Allergies, viruses and acid reflux are treated by controlling symptoms or eliminating the cause. An example might be a cough caused by taking certain blood pressure medications. You stop the cough by changing the medication. Another example might be a cough caused by acid reflux. Controlling the reflux helps control the cough. ? ?USE OF BRONCHODILATOR ("RESCUE") INHALERS: ?There is a risk from using your bronchodilator too frequently.  The risk is that over-reliance on a medication which only relaxes the muscles surrounding the breathing tubes can reduce the effectiveness of medications prescribed to reduce swelling and congestion of the tubes themselves.  Although you feel brief  relief from the bronchodilator inhaler, your asthma may actually be worsening with the tubes becoming more swollen and filled with mucus.  This can delay other crucial treatments, such as oral steroid medications. If you need to use a bronchodilator inhaler daily, several times per day, you should discuss this with your provider.  There are probably better treatments that could be used to keep your asthma under control.  ?   ?HOME CARE ?Only take medications as instructed by your medical team. ?Complete the entire course of an antibiotic. ?Drink plenty of fluids and get plenty of rest. ?Avoid close contacts especially the very young and the elderly ?Cover your mouth if you cough or cough into your sleeve. ?Always remember to wash your hands ?A steam or ultrasonic humidifier can help congestion.  ? ?GET HELP RIGHT AWAY IF: ?You develop worsening fever. ?You become short of breath ?You cough up blood. ?Your symptoms persist after you have completed your treatment plan ?MAKE SURE YOU  ?Understand these instructions. ?Will watch your condition. ?Will get help right away if you are not doing well or get worse. ?  ? ?Thank you for choosing an e-visit. ? ?Your e-visit answers were reviewed by a board certified advanced clinical practitioner to complete your personal care plan. Depending upon the condition, your plan could have included both over the counter or prescription medications. ? ?Please review your pharmacy choice. Make sure the pharmacy is open so you can pick up prescription now. If there is a problem, you may contact your provider through [employer] and have the prescription routed to another pharmacy.  Your safety is important to [place]. If you have drug allergies check your prescription carefully.  ? ?For the next 24 hours you can use MyChart to ask questions about today's visit, request a non-urgent call back, or ask for a work or school excuse. ?You will get an email in the next two days asking about  your experience. I hope that your e-visit has been valuable and will speed your recovery. ? ?Approximately 5 minutes was spent documenting and reviewing patient's chart.  ? ? ?

## 2021-04-17 DIAGNOSIS — M2559 Pain in other specified joint: Secondary | ICD-10-CM | POA: Diagnosis not present

## 2021-04-17 DIAGNOSIS — E291 Testicular hypofunction: Secondary | ICD-10-CM | POA: Diagnosis not present

## 2021-04-17 DIAGNOSIS — R5382 Chronic fatigue, unspecified: Secondary | ICD-10-CM | POA: Diagnosis not present

## 2021-04-17 DIAGNOSIS — Z6830 Body mass index (BMI) 30.0-30.9, adult: Secondary | ICD-10-CM | POA: Diagnosis not present

## 2021-04-17 NOTE — Telephone Encounter (Signed)
Looks like he got antibiotics yesterday through an E-visit.  We can see if he is starting to feel better. ?

## 2021-05-01 ENCOUNTER — Other Ambulatory Visit: Payer: Self-pay | Admitting: Family Medicine

## 2021-05-01 DIAGNOSIS — I10 Essential (primary) hypertension: Secondary | ICD-10-CM

## 2021-05-22 ENCOUNTER — Emergency Department (INDEPENDENT_AMBULATORY_CARE_PROVIDER_SITE_OTHER)
Admission: EM | Admit: 2021-05-22 | Discharge: 2021-05-22 | Disposition: A | Discharge status: Home / Self Care | Payer: BC Managed Care – PPO | Source: Home / Self Care | Attending: Family Medicine | Admitting: Family Medicine

## 2021-05-22 DIAGNOSIS — J069 Acute upper respiratory infection, unspecified: Secondary | ICD-10-CM | POA: Diagnosis not present

## 2021-05-22 LAB — POC SARS CORONAVIRUS 2 AG -  ED: SARS Coronavirus 2 Ag: NEGATIVE

## 2021-05-22 LAB — POCT INFLUENZA A/B
Influenza A, POC: NEGATIVE
Influenza B, POC: NEGATIVE

## 2021-05-22 MED ORDER — BENZONATATE 200 MG PO CAPS: 200.0000 mg | ORAL_CAPSULE | Freq: Three times a day (TID) | ORAL | 0 refills | Status: DC | PRN

## 2021-05-22 MED ORDER — AMOXICILLIN-POT CLAVULANATE 875-125 MG PO TABS: 1.0000 | ORAL_TABLET | Freq: Two times a day (BID) | ORAL | 0 refills | Status: DC

## 2021-05-22 MED ORDER — PREDNISONE 20 MG PO TABS: 40.0000 mg | ORAL_TABLET | Freq: Every day | ORAL | 0 refills | Status: DC

## 2021-05-22 NOTE — ED Triage Notes (Signed)
Pt states that he has a fever, cough, chest congestion and headache. X2 days ? ?Pt states that he is vaccinated for covid.  ?Pt states that he hasn't had flu vaccine.  ?

## 2021-05-22 NOTE — Discharge Instructions (Signed)
Take the antibiotic Augmentin 2 times a day ?Take a probiotic with Augmentin to prevent complications ?Take prednisone 40 mg once a day for 5 days.  This will help with bronchial inflammation and cough ?I have prescribed Tessalon to take for coughing spells.  You may take this 2 or 3 times a day ?You may take in addition Mucinex DM or Delsym every 12 hours ?Make sure you are drinking lots of fluids ?Run a humidifier in your bedroom if you have 1 ?

## 2021-05-22 NOTE — ED Provider Notes (Signed)
?[facility] ? ? ? ?CSN: [id number] ?Arrival date & time: 05/22/21  8841 ? ? ?  ? ?History   ?Chief Complaint ?Chief Complaint  ?Patient presents with  ? Fever  ?  Fever, cough, chest congestion and headache. X2 days  ? ? ?HPI ?Nicholas Christian is a 56 y.o. male.  ? ?HPI ? ?Pleasant gentleman who usually sees Dr. Madilyn Fireman for medical care.  He is unable to get in to be seen today.  For the last couple of days has had fever to 102, coughing and chest congestion.  Fatigue.  States that the cough is harsh and hurts his chest.  He is also had some headache and sinus congestion.  He just got over a bronchial infection.  He was treated through most of March for a bacterial bronchitis.  Required 2 different courses of antibiotics.  States he thought he was well, and then started coughing again.  States he has had a history of recurring pneumonia and has "scars in my lungs".  He feels he is prone to bronchial infections.  He has a 13-year-old son who also has a cough ? ?Past Medical History:  ?Diagnosis Date  ? Complication of anesthesia   ? low O2 sat after kidney surgery ~ 2009  ? DDD (degenerative disc disease), cervical   ? Diabetes mellitus without complication ([place])   ? Diverticulitis   ? Eosinophilic esophagitis   ? hx of- [facility]  ? GERD (gastroesophageal reflux disease)   ? High triglycerides   ? History of hiatal hernia   ? History of kidney stones   ? hx  ? Low HDL (under 40)   ? Low testosterone   ? Migraines   ? Normal cardiac stress test   ? DOBUTAMINE STRESS ECHO 10 years ago   ? OSA (obstructive sleep apnea)   ? no cpap used  ? Pneumonia 10/15  ? no problems now  ? ? ?Patient Active Problem List  ? Diagnosis Date Noted  ? Lateral femoral cutaneous neuropathy 03/23/2021  ? Low back pain 03/23/2021  ? Meralgia paresthetica 03/23/2021  ? Work stress 07/07/2020  ? Inattention 08/27/2019  ? History of lumbar laminectomy 01/22/2019  ? Primary osteoarthritis of left hip 12/25/2018  ? Chronic  left-sided low back pain without sciatica 07/14/2018  ? History of diverticulitis 04/22/2018  ? Calculus of gallbladder without cholecystitis without obstruction 04/22/2018  ? Non-recurrent bilateral inguinal hernia without obstruction or gangrene 03/17/2018  ? Umbilical hernia without obstruction and without gangrene 03/17/2018  ? History of Clostridium difficile colitis 05/09/2015  ? Herniated nucleus pulposus, thoracic 01/18/2014  ? Eosinophilic esophagitis 66/07/3014  ? Obesity 02/16/2011  ? Controlled type 2 diabetes mellitus without complication, without long-term current use of insulin ([place]) 02/07/2011  ? OSA (obstructive sleep apnea) 11/16/2009  ? ESSENTIAL HYPERTENSION, BENIGN 11/16/2009  ? BACK PAIN, THORACIC REGION 11/24/2007  ? DIVERTICULOSIS OF COLON 06/25/2007  ? Fatty liver disease, nonalcoholic 02/20/3233  ? GAD (generalized anxiety disorder) 11/20/2005  ? MIGRAINE, UNSPEC., W/O INTRACTABLE MIGRAINE 11/20/2005  ? Gastroesophageal reflux disease with esophagitis 11/20/2005  ? KIDNEY STONE - NEPHROLITHIASIS 11/20/2005  ? ? ?Past Surgical History:  ?Procedure Laterality Date  ? EDG  01/03/12  ? ESOPHAGEAL DILATION  07/18/11  ? Dr. Tora Duck  ? KIDNEY STONE SURGERY    ? laser at [place] MICRODISCECTOMY Left 01/18/2014  ? Procedure: Left Thoracic five-six microdiskectomy;  Surgeon: Newman Pies, MD;  Location: [facility]  NEURO ORS;  Service: Neurosurgery;  Laterality: Left;  ? NASAL SINUS SURGERY    ? Removal polyps   ? TONSILLECTOMY AND ADENOIDECTOMY    ? TYMPANOSTOMY TUBE PLACEMENT    ? ? ? ? ? ?Home Medications   ? ?Prior to Admission medications   ?Medication Sig Start Date End Date Taking? Authorizing Provider  ?AMBULATORY NON FORMULARY MEDICATION Medication Name: Glucometer and strips and lancet to check once a day.  ?Dx 250.00 12/10/14  Yes Hali Marry, MD  ?amoxicillin-clavulanate (AUGMENTIN) 875-125 MG tablet Take 1 tablet by mouth every 12 (twelve)  hours. 05/22/21  Yes Raylene Everts, MD  ?atorvastatin (LIPITOR) 20 MG tablet TAKE 1 TABLET(20 MG) BY MOUTH QHS 07/07/20  Yes Hali Marry, MD  ?benzonatate (TESSALON) 200 MG capsule Take 1 capsule (200 mg total) by mouth 3 (three) times daily as needed for cough. 05/22/21  Yes Raylene Everts, MD  ?cholecalciferol (VITAMIN D3) 25 MCG (1000 UT) tablet Take 1,000 Units by mouth daily.   Yes [provider]  ?Insulin Pen Needle (PEN NEEDLES) 31G X 8 MM MISC Use for administraiton of Victoza daily. 06/27/15  Yes Hali Marry, MD  ?lidocaine-prilocaine (EMLA) cream 1 gram qid prn 03/23/21  Yes Marcial Pacas, MD  ?lisinopril (ZESTRIL) 10 MG tablet TAKE ONE TAB BY MOUTH DAILY 05/01/21  Yes Hali Marry, MD  ?omeprazole (PRILOSEC) 40 MG capsule Take 1 capsule by mouth. 09/02/18  Yes [provider]  ?predniSONE (DELTASONE) 20 MG tablet Take 2 tablets (40 mg total) by mouth daily with breakfast. 05/22/21  Yes Raylene Everts, MD  ?Semaglutide, 2 MG/DOSE, 8 MG/3ML SOPN Inject 2 mg as directed once a week. 04/13/21  Yes Hali Marry, MD  ?triamcinolone cream (KENALOG) 0.1 % Apply 1 application topically 2 (two) times daily. 12/01/19  Yes Hali Marry, MD  ? ? ?Family History ?Family History  ?Problem Relation Age of Onset  ? Prostate cancer Father   ?     oldr when dx   ? Diabetes Maternal Grandmother   ? ? ?Social History ?Social History  ? ?Tobacco Use  ? Smoking status: Never  ? Smokeless tobacco: Never  ?Vaping Use  ? Vaping Use: Never used  ?Substance Use Topics  ? Alcohol use: No  ? Drug use: No  ? ? ? ?Allergies   ?Januvia [sitagliptin] and Niacin ? ? ?Review of Systems ?Review of Systems ?See HPI ? ?Physical Exam ?Triage Vital Signs ?ED Triage Vitals  ?Enc Vitals Group  ?   BP 05/22/21 0816 (!) 142/92  ?   Pulse Rate 05/22/21 0816 (!) 109  ?   Resp 05/22/21 0816 18  ?   Temp 05/22/21 0816 98.5 ?F (36.9 ?C)  ?   Temp Source 05/22/21 0816 Oral  ?   SpO2 05/22/21  0816 95 %  ?   Weight 05/22/21 0815 225 lb (102.1 kg)  ?   Height 05/22/21 0815 '5\' 11"'$  (1.803 m)  ?   Head Circumference --   ?   Peak Flow --   ?   Pain Score 05/22/21 0814 4  ?   Pain Loc --   ?   Pain Edu? --   ?   Excl. in [place]? --   ? ?No data found. ? ?Updated Vital Signs ?BP (!) 142/92 (BP Location: Left Arm)   Pulse (!) 109   Temp 98.5 ?F (36.9 ?C) (Oral)   Resp 18   Ht 5'  11" (1.803 m)   Wt 102.1 kg   SpO2 95%   BMI 31.38 kg/m?  ?   ? ?Physical Exam ?Constitutional:   ?   General: He is not in acute distress. ?   Appearance: He is well-developed. He is ill-appearing.  ?HENT:  ?   Head: Normocephalic and atraumatic.  ?   Right Ear: Ear canal normal.  ?   Left Ear: Tympanic membrane and ear canal normal.  ?   Ears:  ?   Comments: Right TM is injected ?   Nose: Congestion and rhinorrhea present.  ?   Mouth/Throat:  ?   Mouth: Mucous membranes are moist.  ?   Pharynx: Posterior oropharyngeal erythema present.  ?   Comments: Geographic tongue ?Eyes:  ?   Conjunctiva/sclera: Conjunctivae normal.  ?   Pupils: Pupils are equal, round, and reactive to light.  ?Cardiovascular:  ?   Rate and Rhythm: Normal rate and regular rhythm.  ?   Heart sounds: Normal heart sounds.  ?Pulmonary:  ?   Effort: Pulmonary effort is normal. No respiratory distress.  ?   Breath sounds: Rhonchi present.  ?   Comments: Harsh rhonchi centrally ?Abdominal:  ?   General: There is no distension.  ?   Palpations: Abdomen is soft.  ?Musculoskeletal:     ?   General: Normal range of motion.  ?   Cervical back: Normal range of motion.  ?Lymphadenopathy:  ?   Cervical: Cervical adenopathy present.  ?Skin: ?   General: Skin is warm and dry.  ?Neurological:  ?   General: No focal deficit present.  ?   Mental Status: He is alert.  ?Psychiatric:     ?   Mood and Affect: Mood normal.     ?   Behavior: Behavior normal.  ? ? ? ?UC Treatments / Results  ?Labs ?(all labs ordered are listed, but only abnormal results are displayed) ?Labs Reviewed  ?POC  SARS CORONAVIRUS 2 AG -  ED - Normal  ?POCT INFLUENZA A/B - Normal  ? ? ?EKG ? ? ?Radiology ?No results found. ? ?Procedures ?Procedures (including critical care time) ? ?Medications Ordered in UC ?Medications - No

## 2021-05-31 ENCOUNTER — Encounter: Payer: Self-pay | Admitting: Family Medicine

## 2021-06-08 ENCOUNTER — Ambulatory Visit (INDEPENDENT_AMBULATORY_CARE_PROVIDER_SITE_OTHER): Payer: BC Managed Care – PPO

## 2021-06-08 ENCOUNTER — Encounter: Payer: Self-pay | Admitting: Family Medicine

## 2021-06-08 ENCOUNTER — Ambulatory Visit (INDEPENDENT_AMBULATORY_CARE_PROVIDER_SITE_OTHER): Payer: BC Managed Care – PPO | Admitting: Family Medicine

## 2021-06-08 VITALS — BP 141/85 | HR 96 | Resp 18 | Ht 71.0 in | Wt 227.0 lb

## 2021-06-08 DIAGNOSIS — R059 Cough, unspecified: Secondary | ICD-10-CM | POA: Diagnosis not present

## 2021-06-08 DIAGNOSIS — R053 Chronic cough: Secondary | ICD-10-CM

## 2021-06-08 NOTE — Progress Notes (Signed)
? ?Established Patient Office Visit ? ?Subjective   ?Patient ID: Nicholas Christian, male    DOB: 07/14/1965  Age: 56 y.o. MRN: [id number] ? ?Chief Complaint  ?Patient presents with  ? Cough  ?  Productive cough, 2 months. Headache 2 days.  ? Diabetes Eye Exam  ?  Patient has appt. Scheduled 07/12/21 at [facility]   ? ? ?Persistent Cough - He was seen about 7 weeks ago for URI symptoms.  Cough was severe to the vomiting intermittently.  He was treated with prednisone initially.  He then did an E-visit after he was not improving.  Given azithromycin and tessalon he then went to urgent care about 2 weeks ago.  He had a fever at that time and was given Augmentin.  He is here today because he is still coughing. Headache 2 days.  About a week ago he actually felt like he was starting to get better but then symptoms have returned.  He gets to the point where he coughs so hard he feels like he is going to vomit.  Said he did have a fever at 1 point.  Nothing recently.  He had one family member that was sick initially but they are much better since then.  He feels like the cough is coming from deeper in his mid chest area if that its to the point where it feels sore.  Sometimes at night he will hear extra noise in his chest.  Most the time is dry but occasionally he will get up a little bit of mucus.  His reflux has been a little bit worse lately he does take 40 mg of omeprazole daily. ? ?HPI ? ? ? ?ROS ? ?  ?Objective:  ?  ? ?BP (!) 141/85   Pulse 96   Resp 18   Ht '5\' 11"'$  (1.803 m)   Wt 227 lb (103 kg)   SpO2 95%   BMI 31.66 kg/m?  ? ? ?Physical Exam ?Constitutional:   ?   Appearance: He is well-developed.  ?HENT:  ?   Head: Normocephalic and atraumatic.  ?   Right Ear: External ear normal.  ?   Left Ear: External ear normal.  ?   Nose: Nose normal.  ?Eyes:  ?   Conjunctiva/sclera: Conjunctivae normal.  ?   Pupils: Pupils are equal, round, and reactive to light.  ?Neck:  ?   Thyroid: No thyromegaly.   ?Cardiovascular:  ?   Rate and Rhythm: Normal rate.  ?   Heart sounds: Normal heart sounds.  ?Pulmonary:  ?   Effort: Pulmonary effort is normal.  ?   Breath sounds: Normal breath sounds.  ?Musculoskeletal:  ?   Cervical back: Neck supple.  ?Lymphadenopathy:  ?   Cervical: No cervical adenopathy.  ?Skin: ?   General: Skin is warm and dry.  ?Neurological:  ?   Mental Status: He is alert and oriented to person, place, and time.  ? ? ? ?No results found for any visits on 06/08/21. ? ? ? ?The ASCVD Risk score (Arnett DK, et al., 2019) failed to calculate for the following reasons: ?  The valid total cholesterol range is 130 to 320 mg/dL ? ?  ?Assessment & Plan:  ? ?Problem List Items Addressed This Visit   ?None ?Visit Diagnoses   ? ? Chronic cough    -  Primary  ? Relevant Orders  ? Culture, Bordetella  ? Bordetella Pertussis PCR  ? DG Chest 2 View  ? ?  ? ?  Chronic cough-he has had a cough for a little over 8 weeks at this point he has had periods where he is started to feel better and then got worse again.  Discussed options at this point.  He is already had 2 rounds of antibiotics and says neither 1 made him feel better so I suspect that this is still somewhat viral.  But I would like to get a chest x-ray for further work-up since I do not have a great explanation at this point.  We will also test for pertussis. ?In the meantime we will increase his reflux regimen to try to maximize reflux which can also precipitate a continuous cough.  Increase PPI to twice a day.  If not improving after 2 weeks and okay to go back down to once a day on his current regimen.  We can always refer to pulmonary and/or ENT if not improving. ? ?Return if symptoms worsen or fail to improve.  ? ? ?Beatrice Lecher, MD ? ?

## 2021-06-08 NOTE — Patient Instructions (Signed)
Okay to increase your omeprazole to twice a day take 1 about 20 minutes before your first meal the day and the second 1 at bedtime.  I like for you to try this for 2 weeks to see if you feel like it helps with the cough. ?

## 2021-06-09 ENCOUNTER — Encounter: Payer: Self-pay | Admitting: Family Medicine

## 2021-06-09 LAB — TIQ-MISC

## 2021-06-09 MED ORDER — GUAIFENESIN-CODEINE 100-10 MG/5ML PO SYRP: 5.0000 mL | ORAL_SOLUTION | Freq: Every evening | ORAL | 0 refills | Status: DC | PRN

## 2021-06-09 NOTE — Progress Notes (Signed)
Tonya/Janese - can you call and check on this

## 2021-06-09 NOTE — Progress Notes (Signed)
[name], chest x-ray overall looks good no worrisome findings.  Most consistent with inflammation of the bronchial tubes which makes sense.  Definitely try to increase your reflux medicine to twice a day to see if that is helpful.  If you need any cough syrup for bedtime let me know if it is keeping you awake.  And hopefully we can get this to calm down over the next 2 weeks.  Keep your throat moist.  Run a humidifier in the bedroom if you have 1 to keep the passages also moist and any secretions more thin and moving.

## 2021-06-13 LAB — BORDETELLA PERTUSSIS PCR
B. PERTUSSIS DNA: NOT DETECTED
B. parapertussis DNA: NOT DETECTED

## 2021-06-14 ENCOUNTER — Encounter: Payer: Self-pay | Admitting: Family Medicine

## 2021-06-14 DIAGNOSIS — R0981 Nasal congestion: Secondary | ICD-10-CM

## 2021-06-14 DIAGNOSIS — R053 Chronic cough: Secondary | ICD-10-CM

## 2021-06-14 NOTE — Progress Notes (Signed)
Shawn, test for weeping cough is negative which is great!  Just let me know how you are feeling.

## 2021-06-14 NOTE — Progress Notes (Signed)
Orders Placed This Encounter  ?Procedures  ? CT Maxillofacial W/Cm  ?  Standing Status:   Future  ?  Standing Expiration Date:   06/15/2022  ?  Order Specific Question:   If indicated for the ordered procedure, I authorize the administration of contrast media per Radiology protocol  ?  Answer:   Yes  ?  Order Specific Question:   Preferred imaging location?  ?  Answer:   [facility]  ? ? ?

## 2021-06-26 DIAGNOSIS — R5382 Chronic fatigue, unspecified: Secondary | ICD-10-CM | POA: Diagnosis not present

## 2021-06-26 DIAGNOSIS — Z79890 Hormone replacement therapy (postmenopausal): Secondary | ICD-10-CM | POA: Diagnosis not present

## 2021-06-26 DIAGNOSIS — E291 Testicular hypofunction: Secondary | ICD-10-CM | POA: Diagnosis not present

## 2021-06-28 DIAGNOSIS — F5101 Primary insomnia: Secondary | ICD-10-CM | POA: Diagnosis not present

## 2021-06-28 DIAGNOSIS — Z6831 Body mass index (BMI) 31.0-31.9, adult: Secondary | ICD-10-CM | POA: Diagnosis not present

## 2021-06-28 DIAGNOSIS — E291 Testicular hypofunction: Secondary | ICD-10-CM | POA: Diagnosis not present

## 2021-06-28 DIAGNOSIS — R5382 Chronic fatigue, unspecified: Secondary | ICD-10-CM | POA: Diagnosis not present

## 2021-07-12 ENCOUNTER — Other Ambulatory Visit: Payer: Self-pay | Admitting: Family Medicine

## 2021-07-14 ENCOUNTER — Ambulatory Visit: Payer: BC Managed Care – PPO | Admitting: Family Medicine

## 2021-07-17 ENCOUNTER — Ambulatory Visit: Payer: BC Managed Care – PPO | Admitting: Family Medicine

## 2021-08-21 ENCOUNTER — Encounter: Payer: Self-pay | Admitting: Family Medicine

## 2021-08-25 ENCOUNTER — Ambulatory Visit: Payer: BC Managed Care – PPO | Admitting: Family Medicine

## 2021-08-25 NOTE — Progress Notes (Deleted)
   Established Patient Office Visit  Subjective   Patient ID: Nicholas Christian, male    DOB: 01-28-1966  Age: 56 y.o. MRN: [id number]  No chief complaint on file.   HPI  Diabetes - no hypoglycemic events. No wounds or sores that are not healing well. No increased thirst or urination. Checking glucose at home. Taking medications as prescribed without any side effects.  Hypertension- Pt denies chest pain, SOB, dizziness, or heart palpitations.  Taking meds as directed w/o problems.  Denies medication side effects.     {History (Optional):23778}  ROS    Objective:     There were no vitals taken for this visit. {Vitals History (Optional):23777}  Physical Exam   No results found for any visits on 08/25/21.  {Labs (Optional):23779}  The ASCVD Risk score (Arnett DK, et al., 2019) failed to calculate for the following reasons:   The valid total cholesterol range is 130 to 320 mg/dL    Assessment & Plan:   Problem List Items Addressed This Visit       Cardiovascular and Mediastinum   ESSENTIAL HYPERTENSION, BENIGN - Primary     Endocrine   Controlled type 2 diabetes mellitus without complication, without long-term current use of insulin (H[facility])    No follow-ups on file.    Beatrice Lecher, MD

## 2021-10-04 LAB — HM DIABETES EYE EXAM

## 2021-11-03 ENCOUNTER — Ambulatory Visit (INDEPENDENT_AMBULATORY_CARE_PROVIDER_SITE_OTHER): Payer: BC Managed Care – PPO | Admitting: Family Medicine

## 2021-11-03 VITALS — BP 133/88 | HR 74 | Ht 71.0 in | Wt 220.0 lb

## 2021-11-03 DIAGNOSIS — I10 Essential (primary) hypertension: Secondary | ICD-10-CM

## 2021-11-03 DIAGNOSIS — Z125 Encounter for screening for malignant neoplasm of prostate: Secondary | ICD-10-CM | POA: Diagnosis not present

## 2021-11-03 DIAGNOSIS — Z23 Encounter for immunization: Secondary | ICD-10-CM | POA: Diagnosis not present

## 2021-11-03 DIAGNOSIS — E119 Type 2 diabetes mellitus without complications: Secondary | ICD-10-CM

## 2021-11-03 LAB — POCT GLYCOSYLATED HEMOGLOBIN (HGB A1C): HbA1c POC (<> result, manual entry): 6.4 % (ref 4.0–5.6)

## 2021-11-03 NOTE — Assessment & Plan Note (Addendum)
Doing great!!! Has lost 7 lbs.  Well controlled. Continue current regimen. Follow up in  4 mo  Lab Results  Component Value Date   HGBA1C 6.4 11/03/2021

## 2021-11-03 NOTE — Assessment & Plan Note (Signed)
Well controlled. Continue current regimen. Follow up in  6 mo  

## 2021-11-03 NOTE — Progress Notes (Signed)
   Established Patient Office Visit  Subjective   Patient ID: Nicholas Christian, male    DOB: 1966/01/01  Age: 56 y.o. MRN: [id number]  Chief Complaint  Patient presents with   Follow-up    HPI  Hypertension- Pt denies chest pain, SOB, dizziness, or heart palpitations.  Taking meds as directed w/o problems.  Denies medication side effects.    Diabetes - no hypoglycemic events. No wounds or sores that are not healing well. No increased thirst or urination. Checking glucose at home. Taking medications as prescribed without any side effects.    ROS    Objective:     BP 133/88   Pulse 74   Ht '5\' 11"'$  (1.803 m)   Wt 220 lb (99.8 kg)   SpO2 99%   BMI 30.68 kg/m    Physical Exam Constitutional:      Appearance: He is well-developed.  HENT:     Head: Normocephalic and atraumatic.  Cardiovascular:     Rate and Rhythm: Normal rate and regular rhythm.     Heart sounds: Normal heart sounds.  Pulmonary:     Effort: Pulmonary effort is normal.     Breath sounds: Normal breath sounds.  Skin:    General: Skin is warm and dry.  Neurological:     Mental Status: He is alert and oriented to person, place, and time.  Psychiatric:        Behavior: Behavior normal.      Results for orders placed or performed in visit on 11/03/21  POCT glycosylated hemoglobin (Hb A1C)  Result Value Ref Range   Hemoglobin A1C     HbA1c POC (<> result, manual entry) 6.4 4.0 - 5.6 %   HbA1c, POC (prediabetic range)     HbA1c, POC (controlled diabetic range)        The ASCVD Risk score (Arnett DK, et al., 2019) failed to calculate for the following reasons:   The valid total cholesterol range is 130 to 320 mg/dL    Assessment & Plan:   Problem List Items Addressed This Visit       Cardiovascular and Mediastinum   ESSENTIAL HYPERTENSION, BENIGN    Well controlled. Continue current regimen. Follow up in  51mo      Relevant Orders   POCT glycosylated hemoglobin (Hb A1C) (Completed)    POCT UA - Microalbumin   COMPLETE METABOLIC PANEL WITH GFR   Lipid Panel w/reflex Direct LDL   PSA     Endocrine   Controlled type 2 diabetes mellitus without complication, without long-term current use of insulin ([place] - Primary    Doing great!!! Has lost 7 lbs.  Well controlled. Continue current regimen. Follow up in  4 mo  Lab Results  Component Value Date   HGBA1C 6.4 11/03/2021         Relevant Orders   POCT glycosylated hemoglobin (Hb A1C) (Completed)   POCT UA - Microalbumin   COMPLETE METABOLIC PANEL WITH GFR   Lipid Panel w/reflex Direct LDL   PSA   Other Visit Diagnoses     Special screening for malignant neoplasm of prostate       Relevant Orders   PSA       Return in about 4 months (around 03/05/2022) for Diabetes follow-up, Hypertension.    CBeatrice Lecher MD

## 2021-11-06 NOTE — Progress Notes (Signed)
Nicholas Christian,  Kidney function is stable.  LDL cholesterol looks good this time but triglycerides are up a little bit.  Just make sure continuing to work on healthy food choices.  Prostate test is normal.  Try to schedule your eye exam if you get a chance.

## 2021-11-07 LAB — COMPLETE METABOLIC PANEL WITH GFR
AG Ratio: 1.8 (calc) (ref 1.0–2.5)
ALT: 34 U/L (ref 9–46)
AST: 25 U/L (ref 10–35)
Albumin: 4.6 g/dL (ref 3.6–5.1)
Alkaline phosphatase (APISO): 77 U/L (ref 35–144)
BUN: 15 mg/dL (ref 7–25)
CO2: 29 mmol/L (ref 20–32)
Calcium: 10.3 mg/dL (ref 8.6–10.3)
Chloride: 102 mmol/L (ref 98–110)
Creat: 1.16 mg/dL (ref 0.70–1.30)
Globulin: 2.5 g/dL (calc) (ref 1.9–3.7)
Glucose, Bld: 131 mg/dL — ABNORMAL HIGH (ref 65–99)
Potassium: 4.9 mmol/L (ref 3.5–5.3)
Sodium: 140 mmol/L (ref 135–146)
Total Bilirubin: 0.9 mg/dL (ref 0.2–1.2)
Total Protein: 7.1 g/dL (ref 6.1–8.1)
eGFR: 74 mL/min/{1.73_m2} (ref >=60)

## 2021-11-07 LAB — PSA: PSA: 2.5 ng/mL (ref <=4.00)

## 2021-11-07 LAB — LIPID PANEL W/REFLEX DIRECT LDL
Cholesterol: 132 mg/dL (ref <200)
HDL: 36 mg/dL — ABNORMAL LOW (ref >=40)
LDL Cholesterol (Calc): 69 mg/dL (calc)
Non-HDL Cholesterol (Calc): 96 mg/dL (calc) (ref <130)
Total CHOL/HDL Ratio: 3.7 (calc) (ref <5.0)
Triglycerides: 198 mg/dL — ABNORMAL HIGH (ref <150)

## 2021-11-07 LAB — SPECIMEN COMPROMISED

## 2021-12-21 ENCOUNTER — Encounter: Payer: Self-pay | Admitting: Family Medicine

## 2021-12-21 DIAGNOSIS — E119 Type 2 diabetes mellitus without complications: Secondary | ICD-10-CM

## 2021-12-22 MED ORDER — SEMAGLUTIDE (2 MG/DOSE) 8 MG/3ML ~~LOC~~ SOPN: 2.0000 mg | PEN_INJECTOR | SUBCUTANEOUS | 1 refills | Status: DC

## 2022-01-17 ENCOUNTER — Encounter: Payer: Self-pay | Admitting: Family Medicine

## 2022-03-05 ENCOUNTER — Encounter: Payer: Self-pay | Admitting: Family Medicine

## 2022-03-05 ENCOUNTER — Ambulatory Visit (INDEPENDENT_AMBULATORY_CARE_PROVIDER_SITE_OTHER): Payer: BC Managed Care – PPO | Admitting: Family Medicine

## 2022-03-05 VITALS — BP 121/77 | HR 80 | Ht 71.0 in | Wt 222.0 lb

## 2022-03-05 DIAGNOSIS — E119 Type 2 diabetes mellitus without complications: Secondary | ICD-10-CM

## 2022-03-05 DIAGNOSIS — I10 Essential (primary) hypertension: Secondary | ICD-10-CM

## 2022-03-05 DIAGNOSIS — G4733 Obstructive sleep apnea (adult) (pediatric): Secondary | ICD-10-CM

## 2022-03-05 LAB — POCT GLYCOSYLATED HEMOGLOBIN (HGB A1C): Hemoglobin A1C: 6.8 % — AB (ref 4.0–5.6)

## 2022-03-05 LAB — POCT UA - MICROALBUMIN
Albumin/Creatinine Ratio, Urine, POC: 30
Creatinine, POC: 200 mg/dL
Microalbumin Ur, POC: 30 mg/L

## 2022-03-05 NOTE — Assessment & Plan Note (Signed)
We can always work on getting back in with a specialist to discuss options for treatment of CPAP and see if he is a candidate for some of the newer options since he really feels like he cannot tolerate CPAP.

## 2022-03-05 NOTE — Assessment & Plan Note (Addendum)
Due for urine microalbumin today.  Continue semaglutide.  Continue to work on [employer].  He does walk a lot at work but we did discuss adding in some resistance training at least 2 days a week to help avoid any muscle wasting that can happen with the weight loss he has been experiencing on the GLP-1.  Lab Results  Component Value Date   HGBA1C 6.8 (A) 03/05/2022

## 2022-03-05 NOTE — Progress Notes (Signed)
Established Patient Office Visit  Subjective   Patient ID: Nicholas Christian, male    DOB: 1965/06/06  Age: 57 y.o. MRN: [id number]  Chief Complaint  Patient presents with   Diabetes   Hyperlipidemia    HPI   Diabetes - no hypoglycemic events. No wounds or sores that are not healing well. No increased thirst or urination. Checking glucose at home. Taking medications as prescribed without any side effects.  Hyperlipidemia - tolerating stating well with no myalgias or significant side effects.  Lab Results  Component Value Date   CHOL 132 11/03/2021   HDL 36 (L) 11/03/2021   LDLCALC 69 11/03/2021   TRIG 198 (H) 11/03/2021   CHOLHDL 3.7 11/03/2021   Hypertension- Pt denies chest pain, SOB, dizziness, or heart palpitations.  Taking meds as directed w/o problems.  Denies medication side effects.    Has not been taking his atorvastatin.  He took it for a while but then read some things online that were concerning to him he did also noticed that he was having more pain and discomfort in both of his legs and when he discontinued the medication it seemed to improve.     ROS    Objective:     BP 121/77   Pulse 80   Ht '5\' 11"'$  (1.803 m)   Wt 222 lb (100.7 kg)   SpO2 98%   BMI 30.96 kg/m    Physical Exam Constitutional:      Appearance: He is well-developed.  HENT:     Head: Normocephalic and atraumatic.  Cardiovascular:     Rate and Rhythm: Normal rate and regular rhythm.     Heart sounds: Normal heart sounds.  Pulmonary:     Effort: Pulmonary effort is normal.     Breath sounds: Normal breath sounds.  Skin:    General: Skin is warm and dry.  Neurological:     Mental Status: He is alert and oriented to person, place, and time.  Psychiatric:        Behavior: Behavior normal.      Results for orders placed or performed in visit on 03/05/22  POCT glycosylated hemoglobin (Hb A1C)  Result Value Ref Range   Hemoglobin A1C 6.8 (A) 4.0 - 5.6 %   HbA1c POC (<>  result, manual entry)     HbA1c, POC (prediabetic range)     HbA1c, POC (controlled diabetic range)    POCT UA - Microalbumin  Result Value Ref Range   Microalbumin Ur, POC 30 mg/L   Creatinine, POC 200 mg/dL   Albumin/Creatinine Ratio, Urine, POC 30       The 10-year ASCVD risk score (Arnett DK, et al., 2019) is: 9.3%    Assessment & Plan:   Problem List Items Addressed This Visit       Cardiovascular and Mediastinum   Essential hypertension, benign    Well controlled. Continue current regimen. Follow up in  48mo        Respiratory   OSA (obstructive sleep apnea)    We can always work on getting back in with a specialist to discuss options for treatment of CPAP and see if he is a candidate for some of the newer options since he really feels like he cannot tolerate CPAP.      Relevant Orders   Ambulatory referral to Neurology     Endocrine   Controlled type 2 diabetes mellitus without complication, without long-term current use of insulin ([place] - Primary  Due for urine microalbumin today.  Continue semaglutide.  Continue to work on [employer].  He does walk a lot at work but we did discuss adding in some resistance training at least 2 days a week to help avoid any muscle wasting that can happen with the weight loss he has been experiencing on the GLP-1.  Lab Results  Component Value Date   HGBA1C 6.8 (A) 03/05/2022        Relevant Orders   POCT glycosylated hemoglobin (Hb A1C) (Completed)   POCT UA - Microalbumin (Completed)    Return in about 4 months (around 07/04/2022) for Diabetes follow-up, Hypertension.    Beatrice Lecher, MD

## 2022-03-05 NOTE — Assessment & Plan Note (Signed)
Well controlled. Continue current regimen. Follow up in  6 mo  

## 2022-03-07 ENCOUNTER — Other Ambulatory Visit: Payer: Self-pay | Admitting: Family Medicine

## 2022-03-07 DIAGNOSIS — I10 Essential (primary) hypertension: Secondary | ICD-10-CM

## 2022-03-13 DIAGNOSIS — G4710 Hypersomnia, unspecified: Secondary | ICD-10-CM | POA: Diagnosis not present

## 2022-04-09 ENCOUNTER — Encounter: Payer: Self-pay | Admitting: Emergency Medicine

## 2022-04-09 ENCOUNTER — Ambulatory Visit
Admission: EM | Admit: 2022-04-09 | Discharge: 2022-04-09 | Disposition: A | Discharge status: Home / Self Care | Payer: BC Managed Care – PPO | Attending: Family Medicine | Admitting: Family Medicine

## 2022-04-09 DIAGNOSIS — J209 Acute bronchitis, unspecified: Secondary | ICD-10-CM | POA: Diagnosis not present

## 2022-04-09 MED ORDER — AMOXICILLIN-POT CLAVULANATE 875-125 MG PO TABS: 1.0000 | ORAL_TABLET | Freq: Two times a day (BID) | ORAL | 0 refills | Status: DC

## 2022-04-09 MED ORDER — PREDNISONE 20 MG PO TABS: 40.0000 mg | ORAL_TABLET | Freq: Every day | ORAL | 0 refills | Status: DC

## 2022-04-09 NOTE — ED Provider Notes (Signed)
[name]    CSN: [id number] Arrival date & time: 04/09/22  1308      History   Chief Complaint Chief Complaint  Patient presents with   Cough    HPI Nicholas Christian is a 57 y.o. male.   HPI  Patient states that he gets frequent bronchitis.  He had pneumonia as a child and scarred his lungs.  He currently has a cough for 12 days.  He has taken a variety of over-the-counter cough and cold medications.  He states that he is coughing up sputum.  He feels very tired because he coughs all night.  He states his chest hurts from the coughing.  He does not feel like he is getting better with his over-the-counter medications.  Past Medical History:  Diagnosis Date   Complication of anesthesia    low O2 sat after kidney surgery ~ 2009   DDD (degenerative disc disease), cervical    Diabetes mellitus without complication (HCC)    Diverticulitis    Eosinophilic esophagitis    hx of- [facility]   GERD (gastroesophageal reflux disease)    High triglycerides    History of hiatal hernia    History of kidney stones    hx   Low HDL (under 40)    Low testosterone    Migraines    Normal cardiac stress test    DOBUTAMINE STRESS ECHO 10 years ago    OSA (obstructive sleep apnea)    no cpap used   Pneumonia 10/15   no problems now    Patient Active Problem List   Diagnosis Date Noted   Lateral femoral cutaneous neuropathy 03/23/2021   Low back pain 03/23/2021   Meralgia paresthetica 03/23/2021   Work stress 07/07/2020   Inattention 08/27/2019   History of lumbar laminectomy 01/22/2019   Primary osteoarthritis of left hip 12/25/2018   Chronic left-sided low back pain without sciatica 07/14/2018   History of diverticulitis 04/22/2018   Calculus of gallbladder without cholecystitis without obstruction 04/22/2018   Non-recurrent bilateral inguinal hernia without obstruction or gangrene [id number]   Umbilical hernia without obstruction and without gangrene 03/17/2018    History of Clostridium difficile colitis 05/09/2015   Herniated nucleus pulposus, thoracic [id number]   Eosinophilic esophagitis [id number]   Obesity 02/16/2011   Controlled type 2 diabetes mellitus without complication, without long-term current use of insulin ([place]) 02/07/2011   OSA (obstructive sleep apnea) 11/16/2009   Essential hypertension, benign 11/16/2009   BACK PAIN, THORACIC REGION 11/24/2007   DIVERTICULOSIS OF COLON 06/25/2007   Fatty liver disease, nonalcoholic [id number]   GAD (generalized anxiety disorder) 11/20/2005   MIGRAINE, UNSPEC., W/O INTRACTABLE MIGRAINE 11/20/2005   Gastroesophageal reflux disease with esophagitis 11/20/2005   KIDNEY STONE - NEPHROLITHIASIS 11/20/2005    Past Surgical History:  Procedure Laterality Date   EDG  01/03/12   ESOPHAGEAL DILATION  07/18/11   Dr. Tora Duck   KIDNEY STONE SURGERY     laser at [place] MICRODISCECTOMY Left 01/18/2014   Procedure: Left Thoracic five-six microdiskectomy;  Surgeon: Newman Pies, MD;  Location: [facility];  Service: Neurosurgery;  Laterality: Left;   NASAL SINUS SURGERY     Removal polyps    TONSILLECTOMY AND ADENOIDECTOMY     TYMPANOSTOMY TUBE PLACEMENT         Home Medications    Prior to Admission medications   Medication Sig Start Date End Date Taking? Authorizing Provider  AMBULATORY NON FORMULARY MEDICATION  Medication Name: Glucometer and strips and lancet to check once a day.  Dx 250.00 12/10/14  Yes Hali Marry, MD  Insulin Pen Needle (PEN NEEDLES) 31G X 8 MM MISC Use for administraiton of Victoza daily. 06/27/15  Yes Hali Marry, MD  lisinopril (ZESTRIL) 10 MG tablet TAKE 1 TABLET BY MOUTH EVERY DAY 03/07/22  Yes Hali Marry, MD  omeprazole (PRILOSEC) 40 MG capsule Take 1 capsule by mouth. 09/02/18  Yes [provider]  Semaglutide, 2 MG/DOSE, 8 MG/3ML SOPN Inject 2 mg as directed once a week. 12/22/21   Yes Hali Marry, MD  cholecalciferol (VITAMIN D3) 25 MCG (1000 UT) tablet Take 1,000 Units by mouth daily.    [provider]    Family History Family History  Problem Relation Age of Onset   Prostate cancer Father        oldr when dx    Diabetes Maternal Grandmother     Social History Social History   Tobacco Use   Smoking status: Never   Smokeless tobacco: Never  Vaping Use   Vaping Use: Never used  Substance Use Topics   Alcohol use: No   Drug use: No     Allergies   Januvia [sitagliptin] and Niacin   Review of Systems Review of Systems See HPI  Physical Exam Triage Vital Signs ED Triage Vitals  Enc Vitals Group     BP 04/09/22 1322 114/81     Pulse Rate 04/09/22 1322 (!) 107     Resp 04/09/22 1322 16     Temp 04/09/22 1322 99.2 F (37.3 C)     Temp Source 04/09/22 1322 Oral     SpO2 04/09/22 1322 97 %     Weight 04/09/22 1323 220 lb (99.8 kg)     Height 04/09/22 1323 '5\' 11"'$  (1.803 m)     Head Circumference --      Peak Flow --      Pain Score 04/09/22 1323 0     Pain Loc --      Pain Edu? --      Excl. in [place]? --    No data found.  Updated Vital Signs BP 114/81 (BP Location: Right Arm)   Pulse (!) 107   Temp 99.2 F (37.3 C) (Oral)   Resp 16   Ht '5\' 11"'$  (1.803 m)   Wt 99.8 kg   SpO2 97%   BMI 30.68 kg/m       Physical Exam Constitutional:      General: He is not in acute distress.    Appearance: Normal appearance. He is well-developed. He is ill-appearing.     Comments: Appears tired  HENT:     Head: Normocephalic and atraumatic.     Right Ear: Tympanic membrane and ear canal normal.     Left Ear: Tympanic membrane and ear canal normal.     Nose: Congestion and rhinorrhea present.     Mouth/Throat:     Pharynx: No posterior oropharyngeal erythema.  Eyes:     Conjunctiva/sclera: Conjunctivae normal.     Pupils: Pupils are equal, round, and reactive to light.  Cardiovascular:     Rate and Rhythm: Regular rhythm.  Tachycardia present.     Heart sounds: Normal heart sounds.  Pulmonary:     Effort: Pulmonary effort is normal. No respiratory distress.     Breath sounds: Rhonchi present. No wheezing.  Abdominal:     General: There is no distension.  Palpations: Abdomen is soft.  Musculoskeletal:        General: Normal range of motion.     Cervical back: Normal range of motion.  Lymphadenopathy:     Cervical: No cervical adenopathy.  Skin:    General: Skin is warm and dry.  Neurological:     Mental Status: He is alert.  Psychiatric:        Mood and Affect: Mood normal.        Behavior: Behavior normal.      UC Treatments / Results  Labs (all labs ordered are listed, but only abnormal results are displayed) Labs Reviewed - No data to display  EKG   Radiology No results found.  Procedures Procedures (including critical care time)  Medications Ordered in UC Medications - No data to display  Initial Impression / Assessment and Plan / UC Course  I have reviewed the triage vital signs and the nursing notes.  Pertinent labs & imaging results that were available during my care of the patient were reviewed by me and considered in my medical decision making (see chart for details).     Reviewed that the vast majority of bronchitis is caused by a virus.  After 12 days, with worsening symptoms, it is reasonable to try an antibiotic.  Discussed symptom management at home Final Clinical Impressions(s) / UC Diagnoses   Final diagnoses:  Acute bronchitis, unspecified organism     Discharge Instructions      Make sure that you are drinking lots of liquids May take the DM and Tessalon products during the day Take Phenergan DM at nighttime to help sleep.  May cause drowsiness Take   ED Prescriptions   None    PDMP not reviewed this encounter.   Raylene Everts, MD 04/09/22 [phone]

## 2022-04-09 NOTE — ED Triage Notes (Signed)
Patient c/o non-productive cough, sore throat, facial pain, fever x 2 weeks.  Patient is taken Mucinex, Dayquil and Robitussin.

## 2022-04-09 NOTE — Discharge Instructions (Addendum)
Make sure that you are drinking lots of liquids May take the DM and Tessalon products during the day Take Phenergan DM at nighttime to help sleep.  May cause drowsiness Take the Augmentin antibiotic 2 times a day for a week Take prednisone daily for 5 days.  This is an anti-inflammatory to take down bronchial irritation Call your doctor if not improving by next week

## 2022-04-16 DIAGNOSIS — G4733 Obstructive sleep apnea (adult) (pediatric): Secondary | ICD-10-CM | POA: Diagnosis not present

## 2022-04-18 DIAGNOSIS — G4733 Obstructive sleep apnea (adult) (pediatric): Secondary | ICD-10-CM | POA: Diagnosis not present

## 2022-04-26 ENCOUNTER — Telehealth: Payer: BC Managed Care – PPO | Admitting: Physician Assistant

## 2022-04-26 DIAGNOSIS — J329 Chronic sinusitis, unspecified: Secondary | ICD-10-CM

## 2022-04-26 MED ORDER — DOXYCYCLINE HYCLATE 100 MG PO TABS: 100.0000 mg | ORAL_TABLET | Freq: Two times a day (BID) | ORAL | 0 refills | Status: DC

## 2022-04-26 MED ORDER — FLUTICASONE PROPIONATE 50 MCG/ACT NA SUSP: 2.0000 | Freq: Every day | NASAL | 0 refills | Status: AC

## 2022-04-26 MED ORDER — BENZONATATE 100 MG PO CAPS: 100.0000 mg | ORAL_CAPSULE | Freq: Three times a day (TID) | ORAL | 0 refills | Status: DC | PRN

## 2022-04-26 NOTE — Progress Notes (Signed)
Virtual Visit Consent   Nicholas Christian, you are scheduled for a virtual visit with a [facility] provider today. Just as with appointments in the office, your consent must be obtained to participate. Your consent will be active for this visit and any virtual visit you may have with one of our providers in the next 365 days. If you have a MyChart account, a copy of this consent can be sent to you electronically.  As this is a virtual visit, video technology does not allow for your provider to perform a traditional examination. This may limit your provider's ability to fully assess your condition. If your provider identifies any concerns that need to be evaluated in person or the need to arrange testing (such as labs, EKG, etc.), we will make arrangements to do so. Although advances in technology are sophisticated, we cannot ensure that it will always work on either your end or our end. If the connection with a video visit is poor, the visit may have to be switched to a telephone visit. With either a video or telephone visit, we are not always able to ensure that we have a secure connection.  By engaging in this virtual visit, you consent to the provision of healthcare and authorize for your insurance to be billed (if applicable) for the services provided during this visit. Depending on your insurance coverage, you may receive a charge related to this service.  I need to obtain your verbal consent now. Are you willing to proceed with your visit today? Dariush Zhang has provided verbal consent on 04/26/2022 for a virtual visit (video or telephone). Leeanne Rio, [place]  Date: 04/26/2022 9:14 AM  Virtual Visit via Video Note   I, Leeanne Rio, connected with  Nicholas Christian  ([id number], 16-Jan-1966) on 04/26/22 at  8:30 AM EDT by a video-enabled telemedicine application and verified that I am speaking with the correct person using two identifiers.  Location: Patient: Virtual  Visit Location Patient: Other: Work Provider: Ecologist: Home Office   I discussed the limitations of evaluation and management by telemedicine and the availability of in person appointments. The patient expressed understanding and agreed to proceed.    History of Present Illness: Nicholas Christian is a 57 y.o. who identifies as a male who was assigned male at birth, and is being seen today for recurrence of sinus symptoms. Was treated about 3 weeks ago for bronchitis/sinusitis -- Augmentin/prednisone. Notes he felt well after completion. By Thursday of last week with recurrence of sinus pressure, sinus pain with thick drainage and cough that is persistent sometimes productive. Denies fever, chills. Is taking Dayquil and has taken Tylenol Sinus with little relief.    HPI: HPI  Problems:  Patient Active Problem List   Diagnosis Date Noted   Lateral femoral cutaneous neuropathy 03/23/2021   Low back pain 03/23/2021   Meralgia paresthetica 03/23/2021   Work stress 07/07/2020   Inattention 08/27/2019   History of lumbar laminectomy 01/22/2019   Primary osteoarthritis of left hip 12/25/2018   Chronic left-sided low back pain without sciatica 07/14/2018   History of diverticulitis 04/22/2018   Calculus of gallbladder without cholecystitis without obstruction 04/22/2018   Non-recurrent bilateral inguinal hernia without obstruction or gangrene 123456   Umbilical hernia without obstruction and without gangrene 03/17/2018   History of Clostridium difficile colitis 05/09/2015   Herniated nucleus pulposus, thoracic [id number]   Eosinophilic esophagitis [id number]   Obesity 02/16/2011   Controlled type 2  diabetes mellitus without complication, without long-term current use of insulin ([place]) 02/07/2011   OSA (obstructive sleep apnea) 11/16/2009   Essential hypertension, benign 11/16/2009   BACK PAIN, THORACIC REGION 11/24/2007   DIVERTICULOSIS OF COLON 06/25/2007    Fatty liver disease, nonalcoholic [id number]   GAD (generalized anxiety disorder) 11/20/2005   MIGRAINE, UNSPEC., W/O INTRACTABLE MIGRAINE 11/20/2005   Gastroesophageal reflux disease with esophagitis 11/20/2005   KIDNEY STONE - NEPHROLITHIASIS 11/20/2005    Allergies:  Allergies  Allergen Reactions   Januvia [Sitagliptin] Other (See Comments)    sulfurous burps and nausea.    Niacin Other (See Comments)    Reaction not recalled   Medications:  Current Outpatient Medications:    benzonatate (TESSALON) 100 MG capsule, Take 1 capsule (100 mg total) by mouth 3 (three) times daily as needed for cough., Disp: 30 capsule, Rfl: 0   doxycycline (VIBRA-TABS) 100 MG tablet, Take 1 tablet (100 mg total) by mouth 2 (two) times daily., Disp: 20 tablet, Rfl: 0   fluticasone (FLONASE) 50 MCG/ACT nasal spray, Place 2 sprays into both nostrils daily., Disp: 16 g, Rfl: 0   AMBULATORY NON FORMULARY MEDICATION, Medication Name: Glucometer and strips and lancet to check once a day.  Dx 250.00, Disp: 1 Units, Rfl: 11   cholecalciferol (VITAMIN D3) 25 MCG (1000 UT) tablet, Take 1,000 Units by mouth daily., Disp: , Rfl:    Insulin Pen Needle (PEN NEEDLES) 31G X 8 MM MISC, Use for administraiton of Victoza daily., Disp: 100 each, Rfl: 2   lisinopril (ZESTRIL) 10 MG tablet, TAKE 1 TABLET BY MOUTH EVERY DAY, Disp: 30 tablet, Rfl: 4   omeprazole (PRILOSEC) 40 MG capsule, Take 1 capsule by mouth., Disp: , Rfl:    Semaglutide, 2 MG/DOSE, 8 MG/3ML SOPN, Inject 2 mg as directed once a week., Disp: 9 mL, Rfl: 1  Observations/Objective: Patient is well-developed, well-nourished in no acute distress.  Resting comfortably at home.  Head is normocephalic, atraumatic.  No labored breathing. Speech is clear and coherent with logical content.  Patient is alert and oriented at baseline.   Assessment and Plan: 1. Recurrent sinusitis - fluticasone (FLONASE) 50 MCG/ACT nasal spray; Place 2 sprays into both nostrils daily.   Dispense: 16 g; Refill: 0 - doxycycline (VIBRA-TABS) 100 MG tablet; Take 1 tablet (100 mg total) by mouth 2 (two) times daily.  Dispense: 20 tablet; Refill: 0 - benzonatate (TESSALON) 100 MG capsule; Take 1 capsule (100 mg total) by mouth 3 (three) times daily as needed for cough.  Dispense: 30 capsule; Refill: 0  Rx Doxycycline.  Increase fluids.  Rest.  Saline nasal spray.  Probiotic.  Mucinex as directed.  Humidifier in bedroom. Flonase and Tessalon per orders. Needs in-person follow-up if not resolving or any further recurrence of symptoms.    Follow Up Instructions: I discussed the assessment and treatment plan with the patient. The patient was provided an opportunity to ask questions and all were answered. The patient agreed with the plan and demonstrated an understanding of the instructions.  A copy of instructions were sent to the patient via MyChart unless otherwise noted below.   The patient was advised to call back or seek an in-person evaluation if the symptoms worsen or if the condition fails to improve as anticipated.  Time:  I spent 12 minutes with the patient via telehealth technology discussing the above problems/concerns.    Leeanne Rio, PA-C

## 2022-04-26 NOTE — Patient Instructions (Signed)
Nicholas Christian, thank you for joining Leeanne Rio, PA-C for today's virtual visit.  While this provider is not your primary care provider (PCP), if your PCP is located in our provider database this encounter information will be shared with them immediately following your visit.   [facility] account gives you access to today's visit and all your visits, tests, and labs performed at [facility] " click here if you don't have a [facility] account or go to [email or web address]  Consent: (Patient) Nicholas Christian provided verbal consent for this virtual visit at the beginning of the encounter.  Current Medications:  Current Outpatient Medications:    AMBULATORY NON FORMULARY MEDICATION, Medication Name: Glucometer and strips and lancet to check once a day.  Dx 250.00, Disp: 1 Units, Rfl: 11   amoxicillin-clavulanate (AUGMENTIN) 875-125 MG tablet, Take 1 tablet by mouth every 12 (twelve) hours., Disp: 14 tablet, Rfl: 0   cholecalciferol (VITAMIN D3) 25 MCG (1000 UT) tablet, Take 1,000 Units by mouth daily., Disp: , Rfl:    Insulin Pen Needle (PEN NEEDLES) 31G X 8 MM MISC, Use for administraiton of Victoza daily., Disp: 100 each, Rfl: 2   lisinopril (ZESTRIL) 10 MG tablet, TAKE 1 TABLET BY MOUTH EVERY DAY, Disp: 30 tablet, Rfl: 4   omeprazole (PRILOSEC) 40 MG capsule, Take 1 capsule by mouth., Disp: , Rfl:    predniSONE (DELTASONE) 20 MG tablet, Take 2 tablets (40 mg total) by mouth daily with breakfast., Disp: 10 tablet, Rfl: 0   Semaglutide, 2 MG/DOSE, 8 MG/3ML SOPN, Inject 2 mg as directed once a week., Disp: 9 mL, Rfl: 1   Medications ordered in this encounter:  No orders of the defined types were placed in this encounter.    *If you need refills on other medications prior to your next appointment, please contact your pharmacy*  Follow-Up: Call back or seek an in-person evaluation if the symptoms worsen or if the condition fails to  improve as anticipated.  [facility] [phone]  Other Instructions Please take antibiotic as directed.  Increase fluid intake.  Use Saline nasal spray.  Take a daily multivitamin. Use the Tessalon and Flonase as directed.  Place a humidifier in the bedroom.  Please call or return clinic if symptoms are not improving.  Sinusitis Sinusitis is redness, soreness, and swelling (inflammation) of the paranasal sinuses. Paranasal sinuses are air pockets within the bones of your face (beneath the eyes, the middle of the forehead, or above the eyes). In healthy paranasal sinuses, mucus is able to drain out, and air is able to circulate through them by way of your nose. However, when your paranasal sinuses are inflamed, mucus and air can become trapped. This can allow bacteria and other germs to grow and cause infection. Sinusitis can develop quickly and last only a short time (acute) or continue over a long period (chronic). Sinusitis that lasts for more than 12 weeks is considered chronic.  CAUSES  Causes of sinusitis include: Allergies. Structural abnormalities, such as displacement of the cartilage that separates your nostrils (deviated septum), which can decrease the air flow through your nose and sinuses and affect sinus drainage. Functional abnormalities, such as when the small hairs (cilia) that line your sinuses and help remove mucus do not work properly or are not present. SYMPTOMS  Symptoms of acute and chronic sinusitis are the same. The primary symptoms are pain and pressure around the affected sinuses. Other symptoms include: Upper toothache.  Earache. Headache. Bad breath. Decreased sense of smell and taste. A cough, which worsens when you are lying flat. Fatigue. Fever. Thick drainage from your nose, which often is green and may contain pus (purulent). Swelling and warmth over the affected sinuses. DIAGNOSIS  Your caregiver will perform a physical exam. During the  exam, your caregiver may: Look in your nose for signs of abnormal growths in your nostrils (nasal polyps). Tap over the affected sinus to check for signs of infection. View the inside of your sinuses (endoscopy) with a special imaging device with a light attached (endoscope), which is inserted into your sinuses. If your caregiver suspects that you have chronic sinusitis, one or more of the following tests may be recommended: Allergy tests. Nasal culture A sample of mucus is taken from your nose and sent to a lab and screened for bacteria. Nasal cytology A sample of mucus is taken from your nose and examined by your caregiver to determine if your sinusitis is related to an allergy. TREATMENT  Most cases of acute sinusitis are related to a viral infection and will resolve on their own within 10 days. Sometimes medicines are prescribed to help relieve symptoms (pain medicine, decongestants, nasal steroid sprays, or saline sprays).  However, for sinusitis related to a bacterial infection, your caregiver will prescribe antibiotic medicines. These are medicines that will help kill the bacteria causing the infection.  Rarely, sinusitis is caused by a fungal infection. In theses cases, your caregiver will prescribe antifungal medicine. For some cases of chronic sinusitis, surgery is needed. Generally, these are cases in which sinusitis recurs more than 3 times per year, despite other treatments. HOME CARE INSTRUCTIONS  Drink plenty of water. Water helps thin the mucus so your sinuses can drain more easily. Use a humidifier. Inhale steam 3 to 4 times a day (for example, sit in the bathroom with the shower running). Apply a warm, moist washcloth to your face 3 to 4 times a day, or as directed by your caregiver. Use saline nasal sprays to help moisten and clean your sinuses. Take over-the-counter or prescription medicines for pain, discomfort, or fever only as directed by your caregiver. SEEK IMMEDIATE  MEDICAL CARE IF: You have increasing pain or severe headaches. You have nausea, vomiting, or drowsiness. You have swelling around your face. You have vision problems. You have a stiff neck. You have difficulty breathing. MAKE SURE YOU:  Understand these instructions. Will watch your condition. Will get help right away if you are not doing well or get worse. Document Released: 01/29/2005 Document Revised: 04/23/2011 Document Reviewed: 02/13/2011 [facility] Patient Information 2014 [place], [place].    If you have been instructed to have an in-person evaluation today at a local Urgent Care facility, please use the link below. It will take you to a list of all of our available [facility], including address, phone number and hours of operation. Please do not delay care.  [facility]  If you or a family member do not have a primary care provider, use the link below to schedule a visit and establish care. When you choose a [facility] primary care physician or advanced practice provider, you gain a long-term partner in health. Find a Primary Care Provider  Learn more about [facility]'s in-office and virtual care options: [facility]

## 2022-05-09 DIAGNOSIS — G4733 Obstructive sleep apnea (adult) (pediatric): Secondary | ICD-10-CM | POA: Diagnosis not present

## 2022-05-17 ENCOUNTER — Ambulatory Visit
Admission: EM | Admit: 2022-05-17 | Discharge: 2022-05-17 | Disposition: A | Discharge status: Home / Self Care | Payer: BC Managed Care – PPO | Attending: Family Medicine | Admitting: Family Medicine

## 2022-05-17 ENCOUNTER — Encounter: Payer: Self-pay | Admitting: Emergency Medicine

## 2022-05-17 DIAGNOSIS — M5441 Lumbago with sciatica, right side: Secondary | ICD-10-CM | POA: Diagnosis not present

## 2022-05-17 DIAGNOSIS — M5431 Sciatica, right side: Secondary | ICD-10-CM

## 2022-05-17 LAB — POCT URINALYSIS DIP (MANUAL ENTRY)
Bilirubin, UA: NEGATIVE
Blood, UA: NEGATIVE
Glucose, UA: NEGATIVE mg/dL
Ketones, POC UA: NEGATIVE mg/dL
Leukocytes, UA: NEGATIVE
Nitrite, UA: NEGATIVE
Protein Ur, POC: NEGATIVE mg/dL
Spec Grav, UA: >=1.03 — AB (ref 1.010–1.025)
Urobilinogen, UA: 0.2 E.U./dL
pH, UA: 5.5 (ref 5.0–8.0)

## 2022-05-17 MED ORDER — HYDROCODONE-ACETAMINOPHEN 10-325 MG PO TABS: 1.0000 | ORAL_TABLET | Freq: Four times a day (QID) | ORAL | 0 refills | Status: DC | PRN

## 2022-05-17 MED ORDER — METHYLPREDNISOLONE 4 MG PO TBPK: ORAL_TABLET | ORAL | 0 refills | Status: DC

## 2022-05-17 MED ORDER — TIZANIDINE HCL 4 MG PO TABS: 4.0000 mg | ORAL_TABLET | Freq: Four times a day (QID) | ORAL | 0 refills | Status: DC | PRN

## 2022-05-17 NOTE — ED Provider Notes (Signed)
[name]    CSN: [id number] Arrival date & time: 05/17/22  1553      History   Chief Complaint Chief Complaint  Patient presents with   Flank Pain    HPI Nicholas Christian is a 57 y.o. male.   HPI  Patient's had flank pain radiating around to the front for the last 3 days.  He thought it was a kidney stone.  Has had many kidney stones in the past.  He has been pushing fluids.  In spite of this the pain has continued and now he has pain in the right flank that radiates into his buttock and down his leg.  This is a new symptom for him.  Is not typical for kidney stones.  He has not had any nausea or vomiting.  He has not had any hematuria.  He does not having known bending and lifting or injury to his back.  He did have back surgery 10 years ago.  Past Medical History:  Diagnosis Date   Complication of anesthesia    low O2 sat after kidney surgery ~ 2009   DDD (degenerative disc disease), cervical    Diabetes mellitus without complication    Diverticulitis    Eosinophilic esophagitis    hx of- [facility]   GERD (gastroesophageal reflux disease)    High triglycerides    History of hiatal hernia    History of kidney stones    hx   Low HDL (under 40)    Low testosterone    Migraines    Normal cardiac stress test    DOBUTAMINE STRESS ECHO 10 years ago    OSA (obstructive sleep apnea)    no cpap used   Pneumonia 10/15   no problems now    Patient Active Problem List   Diagnosis Date Noted   Lateral femoral cutaneous neuropathy 03/23/2021   Low back pain 03/23/2021   Meralgia paresthetica 03/23/2021   Work stress 07/07/2020   Inattention 08/27/2019   History of lumbar laminectomy 01/22/2019   Primary osteoarthritis of left hip 12/25/2018   Chronic left-sided low back pain without sciatica 07/14/2018   History of diverticulitis 04/22/2018   Calculus of gallbladder without cholecystitis without obstruction 04/22/2018   Non-recurrent bilateral inguinal  hernia without obstruction or gangrene [id number]   Umbilical hernia without obstruction and without gangrene 03/17/2018   History of Clostridium difficile colitis 05/09/2015   Herniated nucleus pulposus, thoracic [id number]   Eosinophilic esophagitis [id number]   Obesity 02/16/2011   Controlled type 2 diabetes mellitus without complication, without long-term current use of insulin 02/07/2011   OSA (obstructive sleep apnea) 11/16/2009   Essential hypertension, benign 11/16/2009   BACK PAIN, THORACIC REGION 11/24/2007   DIVERTICULOSIS OF COLON 06/25/2007   Fatty liver disease, nonalcoholic [id number]   GAD (generalized anxiety disorder) 11/20/2005   MIGRAINE, UNSPEC., W/O INTRACTABLE MIGRAINE 11/20/2005   Gastroesophageal reflux disease with esophagitis 11/20/2005   KIDNEY STONE - NEPHROLITHIASIS 11/20/2005    Past Surgical History:  Procedure Laterality Date   EDG  01/03/12   ESOPHAGEAL DILATION  07/18/11   Dr. Tora Duck   KIDNEY STONE SURGERY     laser at [facility] MICRODISCECTOMY Left 01/18/2014   Procedure: Left Thoracic five-six microdiskectomy;  Surgeon: Newman Pies, MD;  Location: [facility];  Service: Neurosurgery;  Laterality: Left;   NASAL SINUS SURGERY     Removal polyps    TONSILLECTOMY AND ADENOIDECTOMY  TYMPANOSTOMY TUBE PLACEMENT         Home Medications    Prior to Admission medications   Medication Sig Start Date End Date Taking? Authorizing Provider  cholecalciferol (VITAMIN D3) 25 MCG (1000 UT) tablet Take 1,000 Units by mouth daily.   Yes [provider]  fluticasone (FLONASE) 50 MCG/ACT nasal spray Place 2 sprays into both nostrils daily. 04/26/22  Yes Brunetta Jeans, PA-C  HYDROcodone-acetaminophen (NORCO) 10-325 MG tablet Take 1 tablet by mouth every 6 (six) hours as needed. 05/17/22  Yes Raylene Everts, MD  lisinopril (ZESTRIL) 10 MG tablet TAKE 1 TABLET BY MOUTH EVERY DAY 03/07/22  Yes  Hali Marry, MD  methylPREDNISolone (MEDROL DOSEPAK) 4 MG TBPK tablet tad 05/17/22  Yes Raylene Everts, MD  omeprazole (PRILOSEC) 40 MG capsule Take 1 capsule by mouth. 09/02/18  Yes [provider]  Semaglutide, 2 MG/DOSE, 8 MG/3ML SOPN Inject 2 mg as directed once a week. 12/22/21  Yes Hali Marry, MD  tiZANidine (ZANAFLEX) 4 MG tablet Take 1-2 tablets (4-8 mg total) by mouth every 6 (six) hours as needed for muscle spasms. 05/17/22  Yes Raylene Everts, MD  AMBULATORY NON FORMULARY MEDICATION Medication Name: Glucometer and strips and lancet to check once a day.  Dx 250.00 12/10/14   Hali Marry, MD  benzonatate (TESSALON) 100 MG capsule Take 1 capsule (100 mg total) by mouth 3 (three) times daily as needed for cough. 04/26/22   Brunetta Jeans, PA-C  doxycycline (VIBRA-TABS) 100 MG tablet Take 1 tablet (100 mg total) by mouth 2 (two) times daily. 04/26/22   Brunetta Jeans, PA-C  Insulin Pen Needle (PEN NEEDLES) 31G X 8 MM MISC Use for administraiton of Victoza daily. 06/27/15   Hali Marry, MD    Family History Family History  Problem Relation Age of Onset   Prostate cancer Father        oldr when dx    Diabetes Maternal Grandmother     Social History Social History   Tobacco Use   Smoking status: Never   Smokeless tobacco: Never  Vaping Use   Vaping Use: Never used  Substance Use Topics   Alcohol use: No   Drug use: No     Allergies   Januvia [sitagliptin] and Niacin   Review of Systems Review of Systems  See HPI Physical Exam Triage Vital Signs ED Triage Vitals  Enc Vitals Group     BP 05/17/22 1628 125/81     Pulse Rate 05/17/22 1628 74     Resp 05/17/22 1628 16     Temp 05/17/22 1628 98.3 F (36.8 C)     Temp Source 05/17/22 1628 Oral     SpO2 05/17/22 1628 97 %     Weight --      Height --      Head Circumference --      Peak Flow --      Pain Score 05/17/22 1630 3     Pain Loc --      Pain Edu?  --      Excl. in [place]? --    No data found.  Updated Vital Signs BP 125/81 (BP Location: Right Arm)   Pulse 74   Temp 98.3 F (36.8 C) (Oral)   Resp 16   SpO2 97%      Physical Exam Constitutional:      General: He is not in acute distress.    Appearance: He  is well-developed and normal weight.  HENT:     Head: Normocephalic and atraumatic.  Eyes:     Conjunctiva/sclera: Conjunctivae normal.     Pupils: Pupils are equal, round, and reactive to light.  Cardiovascular:     Rate and Rhythm: Normal rate.  Pulmonary:     Effort: Pulmonary effort is normal. No respiratory distress.  Abdominal:     General: There is no distension.     Palpations: Abdomen is soft.  Musculoskeletal:        General: Normal range of motion.     Cervical back: Normal range of motion. Tenderness present. No rigidity.  Skin:    General: Skin is warm and dry.  Neurological:     Mental Status: He is alert.     Gait: Gait abnormal.     Deep Tendon Reflexes: Reflexes normal.   Moderate tenderness of right the lower lumbar and SI region.  No CVA tenderness.  No abdominal tenderness.  Straight leg raises mildly positive on the right neuro findings   UC Treatments / Results  Labs (all labs ordered are listed, but only abnormal results are displayed) Labs Reviewed  POCT URINALYSIS DIP (MANUAL ENTRY) - Abnormal; Notable for the following components:      Result Value   Spec Grav, UA >=1.030 (*)    All other components within normal limits    EKG   Radiology No results found.  Procedures Procedures (including critical care time)  Medications Ordered in UC Medications - No data to display  Initial Impression / Assessment and Plan / UC Course  I have reviewed the triage vital signs and the nursing notes.  Pertinent labs & imaging results that were available during my care of the patient were reviewed by me and considered in my medical decision making (see chart for details).     With no  hematuria, and physical exam findings that are more sciatica than kidney stone I am going to treat him for lumbar strain with radiculopathy. Final Clinical Impressions(s) / UC Diagnoses   Final diagnoses:  Sciatica of right side  Acute right-sided low back pain with right-sided sciatica     Discharge Instructions      Take the medrol as directed Take all of day one today Take the tizanidine as needed as muscle relaxer Pain medication as needed at night Do not take pain pills and drive See your doctor if not better by next week Actifity as tolerated   ED Prescriptions     Medication Sig Dispense Auth. Provider   HYDROcodone-acetaminophen (NORCO) 10-325 MG tablet Take 1 tablet by mouth every 6 (six) hours as needed. 10 tablet Raylene Everts, MD   methylPREDNISolone (MEDROL DOSEPAK) 4 MG TBPK tablet tad 21 tablet Raylene Everts, MD   tiZANidine (ZANAFLEX) 4 MG tablet Take 1-2 tablets (4-8 mg total) by mouth every 6 (six) hours as needed for muscle spasms. 21 tablet Raylene Everts, MD      I have reviewed the PDMP during this encounter.   Raylene Everts, MD 05/17/22 [phone]

## 2022-05-17 NOTE — Discharge Instructions (Signed)
Take the medrol as directed Take all of day one today Take the tizanidine as needed as muscle relaxer Pain medication as needed at night Do not take pain pills and drive See your doctor if not better by next week Actifity as tolerated

## 2022-05-17 NOTE — ED Triage Notes (Addendum)
Patient reports right sided flank pain radiating around his side to his front, with pain radiating down the back of his right leg intermittently over the last three days. Patient reports history of frequent kidney stones in the past and states the pain is similar to those times. Denies visible blood in the urine, but states it has grown darker than normal. No fever, changes in appetite, N/V/D, abdominal pain, dysuria, urinary hesitancy, retention, or frequency. Reports he had some hydrocodone at home that a relative had given him in the past he took when symptoms first started a few days ago that helped, not currently using anything today to manage symptoms.

## 2022-05-27 ENCOUNTER — Encounter: Payer: Self-pay | Admitting: Family Medicine

## 2022-05-27 DIAGNOSIS — E119 Type 2 diabetes mellitus without complications: Secondary | ICD-10-CM

## 2022-05-28 MED ORDER — SEMAGLUTIDE (2 MG/DOSE) 8 MG/3ML ~~LOC~~ SOPN
2.0000 mg | PEN_INJECTOR | SUBCUTANEOUS | 1 refills | Status: DC
Start: 2022-05-28 — End: 2022-12-28

## 2022-05-29 ENCOUNTER — Ambulatory Visit (INDEPENDENT_AMBULATORY_CARE_PROVIDER_SITE_OTHER): Payer: BC Managed Care – PPO

## 2022-05-29 ENCOUNTER — Ambulatory Visit: Payer: BC Managed Care – PPO | Admitting: Family Medicine

## 2022-05-29 ENCOUNTER — Encounter: Payer: Self-pay | Admitting: Family Medicine

## 2022-05-29 VITALS — BP 112/77 | HR 108 | Ht 71.0 in | Wt 220.0 lb

## 2022-05-29 DIAGNOSIS — R053 Chronic cough: Secondary | ICD-10-CM

## 2022-05-29 DIAGNOSIS — J9811 Atelectasis: Secondary | ICD-10-CM | POA: Diagnosis not present

## 2022-05-29 DIAGNOSIS — R059 Cough, unspecified: Secondary | ICD-10-CM | POA: Diagnosis not present

## 2022-05-29 NOTE — Progress Notes (Signed)
Acute Office Visit  Subjective:     Patient ID: Nicholas Christian, male    DOB: December 18, 1965, 57 y.o.   MRN: [id number]  Chief Complaint  Patient presents with   Cough    HPI Patient is in today for Cough.  It started with bronchitis back in December.  He ended up going to urgent care on February 26 and was treated with Augmentin.  He said he did get better but then the cough started to return he ended up doing a virtual visit after that for sinusitis and bronchitis and then was treated with doxycycline.  He said he has had a frontal headache on and off.  He said he once again got better but then the cough came back.  He does have a history of GERD and says it has been flaring a little bit more recently he has been taking his Prilosec and Tagamet on and off but not consistently.  He does have an upcoming appoint with Dr. Jason Fila, his GI doctor for an esophageal stricture.  He has to have it dilated about every 2 years and he can tell that it is time to go in and have it dilated.  No chest pain.  No chest tightness.  No shortness of breath.  No sore throat.  No sinus symptoms.   ROS      Objective:    BP 112/77   Pulse (!) 108   Ht  (1.803 m)   Wt 220 lb (99.8 kg)   SpO2 97%   BMI 30.68 kg/m     Physical Exam Constitutional:      Appearance: He is well-developed.  HENT:     Head: Normocephalic and atraumatic.     Right Ear: Tympanic membrane, ear canal and external ear normal.     Left Ear: Tympanic membrane, ear canal and external ear normal.     Nose: Nose normal.     Mouth/Throat:     Pharynx: Oropharynx is clear.  Eyes:     Conjunctiva/sclera: Conjunctivae normal.     Pupils: Pupils are equal, round, and reactive to light.  Neck:     Thyroid: No thyromegaly.  Cardiovascular:     Rate and Rhythm: Normal rate.     Heart sounds: Normal heart sounds.  Pulmonary:     Effort: Pulmonary effort is normal.     Breath sounds: Normal breath sounds.  Musculoskeletal:      Cervical back: Neck supple.  Lymphadenopathy:     Cervical: No cervical adenopathy.  Skin:    General: Skin is warm and dry.  Neurological:     Mental Status: He is alert and oriented to person, place, and time.     No results found for any visits on 05/29/22.      Assessment & Plan:   Problem List Items Addressed This Visit   None Visit Diagnoses     Chronic cough    -  Primary   Relevant Orders   DG Chest 2 View      Cough-since it has been 2 months I would like to get a chest x-ray for further workup.  It certainly could be postviral but he is continuing to have persistent symptoms.  GERD could also be aggravating his symptoms so we did discuss taking his Prilosec and Tagamet regularly over the next several weeks to see if that improves.  He does have an upcoming appoint with GI so they can also take a look if  they end up doing an endoscopy which they likely will.  If continuing then consider further evaluation with spirometry and more detailed imaging.  No orders of the defined types were placed in this encounter.   No follow-ups on file.  Nani Gasser, MD

## 2022-06-04 DIAGNOSIS — K200 Eosinophilic esophagitis: Secondary | ICD-10-CM | POA: Diagnosis not present

## 2022-06-04 NOTE — Progress Notes (Signed)
HI Shawn,  Chest x-ray looks okay in regards to no sign of active infection.  They did see some possible scarring scarring versus atelectasis.  This is where the tissues a little bit more compressed at the base of the lungs..  Like to get you scheduled for a breathing test in the next few weeks if possible.  We can do that here in the office called spirometry.

## 2022-06-05 ENCOUNTER — Other Ambulatory Visit: Payer: Self-pay | Admitting: Family Medicine

## 2022-06-05 DIAGNOSIS — I10 Essential (primary) hypertension: Secondary | ICD-10-CM

## 2022-06-06 DIAGNOSIS — G4733 Obstructive sleep apnea (adult) (pediatric): Secondary | ICD-10-CM | POA: Diagnosis not present

## 2022-06-19 DIAGNOSIS — G4733 Obstructive sleep apnea (adult) (pediatric): Secondary | ICD-10-CM | POA: Diagnosis not present

## 2022-07-05 ENCOUNTER — Ambulatory Visit: Payer: BC Managed Care – PPO | Admitting: Family Medicine

## 2022-07-20 DIAGNOSIS — G4733 Obstructive sleep apnea (adult) (pediatric): Secondary | ICD-10-CM | POA: Diagnosis not present

## 2022-07-24 DIAGNOSIS — R1310 Dysphagia, unspecified: Secondary | ICD-10-CM | POA: Diagnosis not present

## 2022-07-24 DIAGNOSIS — K200 Eosinophilic esophagitis: Secondary | ICD-10-CM | POA: Diagnosis not present

## 2022-07-24 DIAGNOSIS — K2289 Other specified disease of esophagus: Secondary | ICD-10-CM | POA: Diagnosis not present

## 2022-07-24 DIAGNOSIS — K222 Esophageal obstruction: Secondary | ICD-10-CM | POA: Diagnosis not present

## 2022-07-31 DIAGNOSIS — K200 Eosinophilic esophagitis: Secondary | ICD-10-CM | POA: Diagnosis not present

## 2022-07-31 DIAGNOSIS — K760 Fatty (change of) liver, not elsewhere classified: Secondary | ICD-10-CM | POA: Diagnosis not present

## 2022-07-31 DIAGNOSIS — K5730 Diverticulosis of large intestine without perforation or abscess without bleeding: Secondary | ICD-10-CM | POA: Diagnosis not present

## 2022-08-19 DIAGNOSIS — G4733 Obstructive sleep apnea (adult) (pediatric): Secondary | ICD-10-CM | POA: Diagnosis not present

## 2022-08-20 DIAGNOSIS — G4733 Obstructive sleep apnea (adult) (pediatric): Secondary | ICD-10-CM | POA: Diagnosis not present

## 2022-08-27 ENCOUNTER — Encounter: Payer: Self-pay | Admitting: Family Medicine

## 2022-08-27 ENCOUNTER — Ambulatory Visit: Payer: BC Managed Care – PPO | Admitting: Family Medicine

## 2022-08-27 VITALS — BP 103/67 | HR 117 | Ht 71.0 in | Wt 227.0 lb

## 2022-08-27 DIAGNOSIS — I77819 Aortic ectasia, unspecified site: Secondary | ICD-10-CM

## 2022-08-27 DIAGNOSIS — I10 Essential (primary) hypertension: Secondary | ICD-10-CM | POA: Diagnosis not present

## 2022-08-27 DIAGNOSIS — K200 Eosinophilic esophagitis: Secondary | ICD-10-CM

## 2022-08-27 DIAGNOSIS — E119 Type 2 diabetes mellitus without complications: Secondary | ICD-10-CM

## 2022-08-27 LAB — POCT GLYCOSYLATED HEMOGLOBIN (HGB A1C): Hemoglobin A1C: 6.6 % — AB (ref 4.0–5.6)

## 2022-08-27 NOTE — Assessment & Plan Note (Signed)
A1c looks great today at 6.6 which is down from previous of 6.8 he is really done a great job though he admits he does not always do the best with his diet.  We did discuss that if he really were to tighten down and do a great job with his diet we might even be able to decrease his dose which would actually help with some of his GI like symptoms that he has been experiencing.

## 2022-08-27 NOTE — Progress Notes (Signed)
Established Patient Office Visit  Subjective   Patient ID: Nicholas Christian, male    DOB: January 22, 1966  Age: 57 y.o. MRN: [id number]  Chief Complaint  Patient presents with   Diabetes    HPI  Diabetes - no hypoglycemic events. No wounds or sores that are not healing well. No increased thirst or urination. Checking glucose at home. Taking medications as prescribed without any side effects.  Hypertension- Pt denies chest pain, SOB, dizziness, or heart palpitations.  Taking meds as directed w/o problems.  Denies medication side effects.    Wants to dicuss f/u on aorta.    This week he was having some mid to low back pain.  He said even last night he had about 2 episodes about 20 minutes each where he had intense pain but then it eased off he says he might be passing another kidney stone but has not had any pain or problems today.  No gross hematuria.    ROS    Objective:     BP 103/67   Pulse (!) 117   Ht 5\' 11"  (1.803 m)   Wt 227 lb (103 kg)   SpO2 94%   BMI 31.66 kg/m    Physical Exam Constitutional:      Appearance: He is well-developed.  HENT:     Head: Normocephalic and atraumatic.  Cardiovascular:     Rate and Rhythm: Normal rate and regular rhythm.     Heart sounds: Normal heart sounds.  Pulmonary:     Effort: Pulmonary effort is normal.     Breath sounds: Normal breath sounds.  Skin:    General: Skin is warm and dry.  Neurological:     Mental Status: He is alert and oriented to person, place, and time.  Psychiatric:        Behavior: Behavior normal.      Results for orders placed or performed in visit on 08/27/22  POCT glycosylated hemoglobin (Hb A1C)  Result Value Ref Range   Hemoglobin A1C 6.6 (A) 4.0 - 5.6 %   HbA1c POC (<> result, manual entry)     HbA1c, POC (prediabetic range)     HbA1c, POC (controlled diabetic range)        The 10-year ASCVD risk score (Arnett DK, et al., 2019) is: 7.7%    Assessment & Plan:   Problem List  Items Addressed This Visit       Cardiovascular and Mediastinum   Essential hypertension, benign    Blood pressure looks great today in fact it is a little bit on the lower end.  Will keep an eye on that if it stays a little lower we might be able to decrease lisinopril down to 5 mg.      Relevant Orders   COMPLETE METABOLIC PANEL WITH GFR     Digestive   Eosinophilic esophagitis    He did follow-up with GI and they really would prefer that he not be on a GLP-1 because they feel like it is most likely worsening and increasing his GERD symptoms.  They did increase his PPI and recently started him on oral budesonide mixed with honey.  We discussed that I am more than happy to look at his medication regimen and change it and take him off of a GLP-1 but for now he would like to continue this.        Endocrine   Controlled type 2 diabetes mellitus without complication, without long-term current use of insulin (HCC) -  Primary    A1c looks great today at 6.6 which is down from previous of 6.8 he is really done a great job though he admits he does not always do the best with his diet.  We did discuss that if he really were to tighten down and do a great job with his diet we might even be able to decrease his dose which would actually help with some of his GI like symptoms that he has been experiencing.      Relevant Orders   POCT glycosylated hemoglobin (Hb A1C) (Completed)   COMPLETE METABOLIC PANEL WITH GFR   Other Visit Diagnoses     Aortic dilatation (HCC)       Relevant Orders   US AORTA      Aortic dilatation-back in 2020 when he had an echocardiogram that showed mild widening of the ascending aorta.  He had a follow-up ultrasound which was negative.  So he would like to have that followed up just to make sure that there are no changes.  Blood pressures have been well-controlled.  Can call back if he feels like he is passing a kidney stone.  We can always do further  workup.  Return in about 4 months (around 12/28/2022) for Diabetes follow-up.    Nani Gasser, MD

## 2022-08-27 NOTE — Assessment & Plan Note (Signed)
Blood pressure looks great today in fact it is a little bit on the lower end.  Will keep an eye on that if it stays a little lower we might be able to decrease lisinopril down to 5 mg.

## 2022-08-27 NOTE — Assessment & Plan Note (Signed)
He did follow-up with GI and they really would prefer that he not be on a GLP-1 because they feel like it is most likely worsening and increasing his GERD symptoms.  They did increase his PPI and recently started him on oral budesonide mixed with honey.  We discussed that I am more than happy to look at his medication regimen and change it and take him off of a GLP-1 but for now he would like to continue this.

## 2022-08-29 ENCOUNTER — Ambulatory Visit (HOSPITAL_BASED_OUTPATIENT_CLINIC_OR_DEPARTMENT_OTHER)
Admission: RE | Admit: 2022-08-29 | Discharge: 2022-08-29 | Disposition: A | Discharge status: Home / Self Care | Payer: BC Managed Care – PPO | Source: Ambulatory Visit | Attending: Family Medicine | Admitting: Family Medicine

## 2022-08-29 DIAGNOSIS — I77819 Aortic ectasia, unspecified site: Secondary | ICD-10-CM | POA: Diagnosis not present

## 2022-08-29 DIAGNOSIS — I714 Abdominal aortic aneurysm, without rupture, unspecified: Secondary | ICD-10-CM | POA: Diagnosis not present

## 2022-09-09 ENCOUNTER — Other Ambulatory Visit: Payer: Self-pay

## 2022-09-09 ENCOUNTER — Encounter: Payer: Self-pay | Admitting: Emergency Medicine

## 2022-09-09 ENCOUNTER — Ambulatory Visit
Admission: EM | Admit: 2022-09-09 | Discharge: 2022-09-09 | Disposition: A | Discharge status: Home / Self Care | Payer: BC Managed Care – PPO | Attending: Family Medicine | Admitting: Family Medicine

## 2022-09-09 DIAGNOSIS — R059 Cough, unspecified: Secondary | ICD-10-CM

## 2022-09-09 DIAGNOSIS — J069 Acute upper respiratory infection, unspecified: Secondary | ICD-10-CM

## 2022-09-09 MED ORDER — BENZONATATE 200 MG PO CAPS: 200.0000 mg | ORAL_CAPSULE | Freq: Three times a day (TID) | ORAL | 0 refills | Status: AC | PRN

## 2022-09-09 MED ORDER — PREDNISONE 20 MG PO TABS: ORAL_TABLET | ORAL | 0 refills | Status: DC

## 2022-09-09 MED ORDER — HYDROCODONE BIT-HOMATROP MBR 5-1.5 MG/5ML PO SOLN: 5.0000 mL | Freq: Four times a day (QID) | ORAL | 0 refills | Status: DC | PRN

## 2022-09-09 MED ORDER — AZITHROMYCIN 250 MG PO TABS: 250.0000 mg | ORAL_TABLET | Freq: Every day | ORAL | 0 refills | Status: DC

## 2022-09-09 NOTE — Discharge Instructions (Addendum)
Advised patient to take medications as directed with food to completion.  Advised patient to take prednisone with Zithromax daily for the next 5 days.  Advised may use Tessalon Perles daily or as needed for cough.  Advised may use Hycodan cough syrup at night for cough due to sedate of effects.  Encouraged increase daily water intake to 64 ounces per day while taking these medications.  Advised if symptoms worsen and/or unresolved please follow-up with PCP or here for further evaluation.

## 2022-09-09 NOTE — ED Provider Notes (Signed)
[facility]    CSN: [id number] Arrival date & time: 09/09/22  0913      History   Chief Complaint Chief Complaint  Patient presents with   Cough   Wheezing   Sore Throat    HPI Nicholas Christian is a 57 y.o. Christian.   HPI Nicholas 57 year old Christian presents with cough, sore throat, fever, and  headache for 7 days.  Patient reports oral temperature of 100.5 days ago while at [employer].  Reports recent home COVID-19 test was negative.  PMH significant for obesity, T2DM without complication, and history of diverticulitis.  Past Medical History:  Diagnosis Date   Complication of anesthesia    low O2 sat after kidney surgery ~ 2009   DDD (degenerative disc disease), cervical    Diabetes mellitus without complication (HCC)    Diverticulitis    Eosinophilic esophagitis    hx of- [facility]   GERD (gastroesophageal reflux disease)    High triglycerides    History of hiatal hernia    History of kidney stones    hx   Low HDL (under 40)    Low testosterone    Migraines    Normal cardiac stress test    DOBUTAMINE STRESS ECHO 10 years ago    OSA (obstructive sleep apnea)    no cpap used   Pneumonia 10/15   no problems now    Patient Active Problem List   Diagnosis Date Noted   Lateral femoral cutaneous neuropathy 03/23/2021   Low back pain 03/23/2021   Meralgia paresthetica 03/23/2021   Work stress 07/07/2020   Inattention 08/27/2019   History of lumbar laminectomy 01/22/2019   Primary osteoarthritis of left hip 12/25/2018   Chronic left-sided low back pain without sciatica 07/14/2018   History of diverticulitis 04/22/2018   Calculus of gallbladder without cholecystitis without obstruction 04/22/2018   Non-recurrent bilateral inguinal hernia without obstruction or gangrene 03/17/2018   Umbilical hernia without obstruction and without gangrene 03/17/2018   History of Clostridium difficile colitis 05/09/2015   Herniated nucleus pulposus, thoracic 01/18/2014    Eosinophilic esophagitis 07/18/2011   Obesity 02/16/2011   Controlled type 2 diabetes mellitus without complication, without long-term current use of insulin (HCC) 02/07/2011   OSA (obstructive sleep apnea) 11/16/2009   Essential hypertension, benign 11/16/2009   BACK PAIN, THORACIC REGION 11/24/2007   DIVERTICULOSIS OF COLON 06/25/2007   Fatty liver disease, nonalcoholic 06/25/2007   GAD (generalized anxiety disorder) 11/20/2005   MIGRAINE, UNSPEC., W/O INTRACTABLE MIGRAINE 11/20/2005   Gastroesophageal reflux disease with esophagitis 11/20/2005   KIDNEY STONE - NEPHROLITHIASIS 11/20/2005    Past Surgical History:  Procedure Laterality Date   EDG  01/03/12   ESOPHAGEAL DILATION  07/18/11   Dr. Mariana Kaufman   KIDNEY STONE SURGERY     laser at [facility].    LUMBAR LAMINECTOMY/DECOMPRESSION MICRODISCECTOMY Left 01/18/2014   Procedure: Left Thoracic five-six microdiskectomy;  Surgeon: Tressie Stalker, MD;  Location: [facility];  Service: Neurosurgery;  Laterality: Left;   NASAL SINUS SURGERY     Removal polyps    TONSILLECTOMY AND ADENOIDECTOMY     TYMPANOSTOMY TUBE PLACEMENT         Home Medications    Prior to Admission medications   Medication Sig Start Date End Date Taking? Authorizing Provider  AMBULATORY NON FORMULARY MEDICATION Medication Name: Glucometer and strips and lancet to check once a day.  Dx 250.00 12/10/14  Yes Agapito Games, MD  amphetamine-dextroamphetamine (ADDERALL) 10 MG  tablet Take 5 mg by mouth 2 (two) times daily. 04/03/22  Yes [provider]  azithromycin (ZITHROMAX) 250 MG tablet Take 1 tablet (250 mg total) by mouth daily. Take first 2 tablets together, then 1 every day until finished. 09/09/22  Yes Trevor Iha, FNP  benzonatate (TESSALON) 200 MG capsule Take 1 capsule (200 mg total) by mouth 3 (three) times daily as needed for up to 7 days. 09/09/22 09/16/22 Yes Trevor Iha, FNP  cholecalciferol (VITAMIN D3) 25 MCG (1000 UT)  tablet Take 1,000 Units by mouth daily.   Yes [provider]  fluticasone (FLONASE) 50 MCG/ACT nasal spray Place 2 sprays into both nostrils daily. 04/26/22  Yes Waldon Merl, PA-C  HYDROcodone bit-homatropine (HYCODAN) 5-1.5 MG/5ML syrup Take 5 mLs by mouth every 6 (six) hours as needed for cough. 09/09/22  Yes Trevor Iha, FNP  Insulin Pen Needle (PEN NEEDLES) 31G X 8 MM MISC Use for administraiton of Victoza daily. 06/27/15  Yes Agapito Games, MD  lisinopril (ZESTRIL) 10 MG tablet TAKE 1 TABLET BY MOUTH EVERY DAY 06/05/22  Yes Agapito Games, MD  omeprazole (PRILOSEC) 40 MG capsule Take 1 capsule by mouth. 09/02/18  Yes [provider]  predniSONE (DELTASONE) 20 MG tablet Take 3 tabs PO daily x 5 days. 09/09/22  Yes Trevor Iha, FNP  Semaglutide, 2 MG/DOSE, 8 MG/3ML SOPN Inject 2 mg as directed once a week. 05/28/22  Yes Agapito Games, MD    Family History Family History  Problem Relation Age of Onset   Prostate cancer Father        oldr when dx    Diabetes Maternal Grandmother     Social History Social History   Tobacco Use   Smoking status: Never   Smokeless tobacco: Never  Vaping Use   Vaping status: Never Used  Substance Use Topics   Alcohol use: No   Drug use: No     Allergies   Januvia [sitagliptin] and Niacin   Review of Systems Review of Systems  Constitutional:  Positive for fever.  HENT:  Positive for sore throat.   Respiratory:  Positive for cough.   All other systems reviewed and are negative.    Physical Exam Triage Vital Signs ED Triage Vitals  Encounter Vitals Group     BP      Systolic BP Percentile      Diastolic BP Percentile      Pulse      Resp      Temp      Temp src      SpO2      Weight      Height      Head Circumference      Peak Flow      Pain Score      Pain Loc      Pain Education      Exclude from Growth Chart    No data found.  Updated Vital Signs BP 120/84 (BP Location:  Left Arm)   Pulse 81   Temp 98.9 F (37.2 C) (Oral)   Resp 18   SpO2 96%      Physical Exam Vitals and nursing note reviewed.  Constitutional:      General: He is not in acute distress.    Appearance: Normal appearance. He is obese. He is ill-appearing.  HENT:     Head: Normocephalic and atraumatic.     Right Ear: Tympanic membrane, ear canal and external ear normal.  Left Ear: Tympanic membrane, ear canal and external ear normal.     Mouth/Throat:     Mouth: Mucous membranes are moist.     Pharynx: Oropharynx is clear.  Eyes:     Extraocular Movements: Extraocular movements intact.     Conjunctiva/sclera: Conjunctivae normal.     Pupils: Pupils are equal, round, and reactive to light.  Cardiovascular:     Rate and Rhythm: Normal rate and regular rhythm.     Pulses: Normal pulses.     Heart sounds: Normal heart sounds.  Pulmonary:     Effort: Pulmonary effort is normal.     Breath sounds: Normal breath sounds. No wheezing, rhonchi or rales.     Comments: Frequent nonproductive/productive cough noted on exam Musculoskeletal:        General: Normal range of motion.     Cervical back: Normal range of motion and neck supple.  Skin:    General: Skin is warm and dry.  Neurological:     General: No focal deficit present.     Mental Status: He is alert and oriented to person, place, and time. Mental status is at baseline.  Psychiatric:        Mood and Affect: Mood normal.        Behavior: Behavior normal.        Thought Content: Thought content normal.      UC Treatments / Results  Labs (all labs ordered are listed, but only abnormal results are displayed) Labs Reviewed - No data to display  EKG   Radiology No results found.  Procedures Procedures (including critical care time)  Medications Ordered in UC Medications - No data to display  Initial Impression / Assessment and Plan / UC Course  I have reviewed the triage vital signs and the nursing  notes.  Pertinent labs & imaging results that were available during my care of the patient were reviewed by me and considered in my medical decision making (see chart for details).     MDM: 1.  Acute URI-Rx'd Zithromax (500 mg day 1, then 250 mg daily x 4 days); 2.  Cough, unspecified type-Rx prednisone 60 mg daily, Tessalon Perles 200 mg 3 times daily, as needed, Hycodan 5-1.5 mg / 5 mL syrup: Take 5 mL by mouth every 6 hours as needed for cough. Advised patient to take medications as directed with food to completion.  Advised patient to take prednisone with Zithromax daily for the next 5 days.  Advised may use Tessalon Perles daily or as needed for cough.  Advised may use Hycodan cough syrup at night for cough due to sedate of effects.  Encouraged increase daily water intake to 64 ounces per day while taking these medications.  Advised if symptoms worsen and/or unresolved please follow-up with PCP or here for further evaluation.  Patient discharged home, hemodynamically stable.  Final Clinical Impressions(s) / UC Diagnoses   Final diagnoses:  Acute URI  Cough, unspecified type     Discharge Instructions      Advised patient to take medications as directed with food to completion.  Advised patient to take prednisone with Zithromax daily for the next 5 days.  Advised may use Tessalon Perles daily or as needed for cough.  Advised may use Hycodan cough syrup at night for cough due to sedate of effects.  Encouraged increase daily water intake to 64 ounces per day while taking these medications.  Advised if symptoms worsen and/or unresolved please follow-up with PCP or here  for further evaluation.     ED Prescriptions     Medication Sig Dispense Auth. Provider   azithromycin (ZITHROMAX) 250 MG tablet Take 1 tablet (250 mg total) by mouth daily. Take first 2 tablets together, then 1 every day until finished. 6 tablet Trevor Iha, FNP   predniSONE (DELTASONE) 20 MG tablet Take 3 tabs PO  daily x 5 days. 15 tablet Trevor Iha, FNP   benzonatate (TESSALON) 200 MG capsule Take 1 capsule (200 mg total) by mouth 3 (three) times daily as needed for up to 7 days. 40 capsule Trevor Iha, FNP   HYDROcodone bit-homatropine (HYCODAN) 5-1.5 MG/5ML syrup Take 5 mLs by mouth every 6 (six) hours as needed for cough. 120 mL Trevor Iha, FNP      I have reviewed the PDMP during this encounter.   Trevor Iha, FNP 09/09/22 1003

## 2022-09-09 NOTE — ED Triage Notes (Signed)
Patient presents to Urgent Care with complaints of cough, chest congestion, headache, sore throat and fever last night 100 F since 5-6 days ago. Patient reports they went to [employer]. Son was tested positive on July 12. Did do a home covid test was negative. Does have history of lung and chest congestion. Tried Tylenol Sinus, Mucinex cough.

## 2022-09-11 ENCOUNTER — Encounter: Payer: Self-pay | Admitting: Family Medicine

## 2022-09-11 DIAGNOSIS — I77819 Aortic ectasia, unspecified site: Secondary | ICD-10-CM

## 2022-09-11 NOTE — Progress Notes (Signed)
[name],  Ultrasound of the aneurysm shows that it is measuring approximately 3.6 cm.  They do feel like this has progressed since prior imaging in 2021.  Sometimes ultrasound can  overestimate the size so they are recommending a CT for confirmation.  If you are okay with a scheduling that I would like to do so.

## 2022-09-13 ENCOUNTER — Other Ambulatory Visit: Payer: Self-pay | Admitting: Family Medicine

## 2022-09-13 DIAGNOSIS — I77819 Aortic ectasia, unspecified site: Secondary | ICD-10-CM

## 2022-09-13 DIAGNOSIS — I10 Essential (primary) hypertension: Secondary | ICD-10-CM

## 2022-09-13 NOTE — Progress Notes (Signed)
Orders Placed This Encounter  Procedures   CT ANGIO CHEST/ABD/PEL FOR DISSECTION W &/OR WO CONTRAST    Standing Status:   Future    Standing Expiration Date:   09/13/2023    Order Specific Question:   If indicated for the ordered procedure, I authorize the administration of contrast media per Radiology protocol    Answer:   Yes    Order Specific Question:   Does the patient have a contrast media/X-ray dye allergy?    Answer:   No    Order Specific Question:   Preferred imaging location?    Answer:   [facility]

## 2022-09-20 ENCOUNTER — Telehealth: Payer: BC Managed Care – PPO | Admitting: Physician Assistant

## 2022-09-20 ENCOUNTER — Ambulatory Visit: Payer: BC Managed Care – PPO

## 2022-09-20 DIAGNOSIS — B9689 Other specified bacterial agents as the cause of diseases classified elsewhere: Secondary | ICD-10-CM

## 2022-09-20 DIAGNOSIS — K760 Fatty (change of) liver, not elsewhere classified: Secondary | ICD-10-CM | POA: Diagnosis not present

## 2022-09-20 DIAGNOSIS — I2510 Atherosclerotic heart disease of native coronary artery without angina pectoris: Secondary | ICD-10-CM | POA: Diagnosis not present

## 2022-09-20 DIAGNOSIS — I77819 Aortic ectasia, unspecified site: Secondary | ICD-10-CM

## 2022-09-20 DIAGNOSIS — I719 Aortic aneurysm of unspecified site, without rupture: Secondary | ICD-10-CM | POA: Diagnosis not present

## 2022-09-20 MED ORDER — IOHEXOL 350 MG/ML SOLN
100.0000 mL | Freq: Once | INTRAVENOUS | Status: AC | PRN
  Administered 2022-09-20: 100 mL via INTRAVENOUS

## 2022-09-20 NOTE — Progress Notes (Signed)
Because you have already been treated with a round of antibiotics and steroids, I feel your condition warrants further re-evaluation and I recommend that you be seen in a face to face visit.   NOTE: There will be NO CHARGE for this eVisit   If you are having a true medical emergency please call 911.      For an urgent face to face visit, [place] has eight urgent care centers for your convenience:   NEW!! [facility] Health Urgent Care Center at [facility] Hights Get Driving Directions [phone] [address] [place], [address]    [facility] Health Urgent Care Center at [facility] Get Driving Directions [phone] [address] [place], [place] [address]   [facility] Health Urgent Care Center [facility]) Get Driving Directions [phone] [address] [place], [place] [address]  [facility] Health Urgent Care Center [facility]) Get Driving Directions [phone] [address] [place],  [place]  [address]  [facility] Health Urgent Care Center [facility] - at [employer]  [phone] [address] [place],  [place] [address]   [facility] Health Urgent Care at [facility] Get Driving Directions [phone] [address] [place], [place] [address]   [facility] Health Urgent Care at [facility] Get Driving Directions  [phone] [address] [place], [place] [address]   [facility] Health Urgent Care at [facility] Directions [phone] [address], [address] [place], [place] [address]  Your MyChart E-visit questionnaire answers were reviewed by a board certified advanced clinical practitioner to complete your personal care plan based on your specific symptoms.  Thank you for using e-Visits.

## 2022-09-21 ENCOUNTER — Ambulatory Visit: Payer: BC Managed Care – PPO

## 2022-09-21 ENCOUNTER — Inpatient Hospital Stay: Admission: RE | Admit: 2022-09-21 | Payer: BC Managed Care – PPO | Source: Ambulatory Visit

## 2022-09-21 ENCOUNTER — Ambulatory Visit
Admission: EM | Admit: 2022-09-21 | Discharge: 2022-09-21 | Disposition: A | Discharge status: Home / Self Care | Payer: BC Managed Care – PPO | Source: Home / Self Care

## 2022-09-21 DIAGNOSIS — J309 Allergic rhinitis, unspecified: Secondary | ICD-10-CM

## 2022-09-21 DIAGNOSIS — R059 Cough, unspecified: Secondary | ICD-10-CM

## 2022-09-21 DIAGNOSIS — R053 Chronic cough: Secondary | ICD-10-CM

## 2022-09-21 MED ORDER — BUDESONIDE-FORMOTEROL FUMARATE 80-4.5 MCG/ACT IN AERO: 2.0000 | INHALATION_SPRAY | Freq: Two times a day (BID) | RESPIRATORY_TRACT | 0 refills | Status: DC

## 2022-09-21 MED ORDER — FEXOFENADINE HCL 180 MG PO TABS: 180.0000 mg | ORAL_TABLET | Freq: Every day | ORAL | 0 refills | Status: DC

## 2022-09-21 NOTE — Discharge Instructions (Addendum)
Advised patient of chest x-ray results with hardcopy and image provided to patient.  Advised take medications as directed.  Advised may take Allegra daily for the next 5 days, as needed for concurrent postnasal drainage/allergic rhinitis.  Encouraged patient to increase daily water intake to 64 ounces per day while taking these medications.Advised if symptoms worsen and/or unresolved please follow-up with PCP for further evaluation and possibility of PFT/pulmonary evaluation.

## 2022-09-21 NOTE — ED Triage Notes (Addendum)
Pt reports voice "comes and goes" cough started on 09/04/22 is no improving with Tessalon, azithromycin, and prednisone.   Pt reports he had CT done yesterday 09/20/22 and part of his results said, atelectasis in left lower lobe, and ascending aortic aneurysms

## 2022-09-21 NOTE — ED Provider Notes (Signed)
[facility]    CSN: [id number] Arrival date & time: 09/21/22  1420      History   Chief Complaint Chief Complaint  Patient presents with   Appointment    1430    HPI Nicholas Christian is a 57 y.o. male.   HPI 57 year old male presents with cough for 3 to 4 weeks.  Patient was evaluated/treated by me on 09/09/2022 for acute URI and cough-please see epic for that encounter note.  PMH significant for T2DM without complication, history of diverticulitis, and obesity  Past Medical History:  Diagnosis Date   Complication of anesthesia    low O2 sat after kidney surgery ~ 2009   DDD (degenerative disc disease), cervical    Diabetes mellitus without complication (HCC)    Diverticulitis    Eosinophilic esophagitis    hx of- [facility]   GERD (gastroesophageal reflux disease)    High triglycerides    History of hiatal hernia    History of kidney stones    hx   Low HDL (under 40)    Low testosterone    Migraines    Normal cardiac stress test    DOBUTAMINE STRESS ECHO 10 years ago    OSA (obstructive sleep apnea)    no cpap used   Pneumonia 10/15   no problems now    Patient Active Problem List   Diagnosis Date Noted   Lateral femoral cutaneous neuropathy 03/23/2021   Low back pain 03/23/2021   Meralgia paresthetica 03/23/2021   Work stress 07/07/2020   Inattention 08/27/2019   History of lumbar laminectomy 01/22/2019   Primary osteoarthritis of left hip 12/25/2018   Chronic left-sided low back pain without sciatica 07/14/2018   History of diverticulitis 04/22/2018   Calculus of gallbladder without cholecystitis without obstruction 04/22/2018   Non-recurrent bilateral inguinal hernia without obstruction or gangrene 03/17/2018   Umbilical hernia without obstruction and without gangrene 03/17/2018   History of Clostridium difficile colitis 05/09/2015   Herniated nucleus pulposus, thoracic 01/18/2014   Eosinophilic esophagitis 07/18/2011   Obesity  02/16/2011   Controlled type 2 diabetes mellitus without complication, without long-term current use of insulin (HCC) 02/07/2011   OSA (obstructive sleep apnea) 11/16/2009   Essential hypertension, benign 11/16/2009   BACK PAIN, THORACIC REGION 11/24/2007   DIVERTICULOSIS OF COLON 06/25/2007   Fatty liver disease, nonalcoholic 06/25/2007   GAD (generalized anxiety disorder) 11/20/2005   MIGRAINE, UNSPEC., W/O INTRACTABLE MIGRAINE 11/20/2005   Gastroesophageal reflux disease with esophagitis 11/20/2005   KIDNEY STONE - NEPHROLITHIASIS 11/20/2005    Past Surgical History:  Procedure Laterality Date   EDG  01/03/12   ESOPHAGEAL DILATION  07/18/11   Dr. Mariana Kaufman   KIDNEY STONE SURGERY     laser at [facility].    LUMBAR LAMINECTOMY/DECOMPRESSION MICRODISCECTOMY Left 01/18/2014   Procedure: Left Thoracic five-six microdiskectomy;  Surgeon: Tressie Stalker, MD;  Location: [facility];  Service: Neurosurgery;  Laterality: Left;   NASAL SINUS SURGERY     Removal polyps    TONSILLECTOMY AND ADENOIDECTOMY     TYMPANOSTOMY TUBE PLACEMENT         Home Medications    Prior to Admission medications   Medication Sig Start Date End Date Taking? Authorizing Provider  dextromethorphan-guaiFENesin (MUCINEX DM) 30-600 MG 12hr tablet Take 1 tablet by mouth 2 (two) times daily.   Yes [provider]  fluticasone (VERAMYST) 27.5 MCG/SPRAY nasal spray Place 2 sprays into the nose daily.   Yes [provider]  AMBULATORY NON FORMULARY MEDICATION Medication Name: Glucometer and strips and lancet to check once a day.  Dx 250.00 12/10/14   Agapito Games, MD  amphetamine-dextroamphetamine (ADDERALL) 10 MG tablet Take 5 mg by mouth 2 (two) times daily. 04/03/22   [provider]  budesonide-formoterol (SYMBICORT) 80-4.5 MCG/ACT inhaler Inhale 2 puffs into the lungs 2 (two) times daily. 09/21/22  Yes Trevor Iha, FNP  cholecalciferol (VITAMIN D3) 25 MCG (1000 UT) tablet  Take 1,000 Units by mouth daily.    [provider]  fexofenadine (ALLEGRA ALLERGY) 180 MG tablet Take 1 tablet (180 mg total) by mouth daily for 15 days. 09/21/22 10/06/22 Yes Trevor Iha, FNP  fluticasone (FLONASE) 50 MCG/ACT nasal spray Place 2 sprays into both nostrils daily. 04/26/22   Waldon Merl, PA-C  Insulin Pen Needle (PEN NEEDLES) 31G X 8 MM MISC Use for administraiton of Victoza daily. 06/27/15   Agapito Games, MD  lisinopril (ZESTRIL) 10 MG tablet TAKE 1 TABLET BY MOUTH EVERY DAY 09/17/22   Agapito Games, MD  omeprazole (PRILOSEC) 40 MG capsule Take 1 capsule by mouth. 09/02/18   [provider]  Semaglutide, 2 MG/DOSE, 8 MG/3ML SOPN Inject 2 mg as directed once a week. 05/28/22   Agapito Games, MD    Family History Family History  Problem Relation Age of Onset   Prostate cancer Father        oldr when dx    Diabetes Maternal Grandmother     Social History Social History   Tobacco Use   Smoking status: Never   Smokeless tobacco: Never  Vaping Use   Vaping status: Never Used  Substance Use Topics   Alcohol use: No   Drug use: No     Allergies   Januvia [sitagliptin] and Niacin   Review of Systems Review of Systems  Respiratory:  Positive for cough.   All other systems reviewed and are negative.    Physical Exam Triage Vital Signs ED Triage Vitals [09/21/22 1441]  Encounter Vitals Group     BP      Systolic BP Percentile      Diastolic BP Percentile      Pulse      Resp      Temp      Temp Source Oral     SpO2      Weight      Height      Head Circumference      Peak Flow      Pain Score      Pain Loc      Pain Education      Exclude from Growth Chart    No data found.  Updated Vital Signs BP 120/78 (BP Location: Right Arm)   Pulse 86   Temp 98.1 F (36.7 C) (Oral)   Resp 18   SpO2 96%    Physical Exam Vitals and nursing note reviewed.  Constitutional:      General: He is not in acute  distress.    Appearance: Normal appearance. He is obese. He is not ill-appearing.  HENT:     Head: Normocephalic and atraumatic.     Right Ear: Tympanic membrane, ear canal and external ear normal.     Left Ear: Tympanic membrane, ear canal and external ear normal.     Mouth/Throat:     Mouth: Mucous membranes are moist.     Pharynx: Oropharynx is clear.  Eyes:  Extraocular Movements: Extraocular movements intact.     Conjunctiva/sclera: Conjunctivae normal.     Pupils: Pupils are equal, round, and reactive to light.  Cardiovascular:     Rate and Rhythm: Normal rate and regular rhythm.     Pulses: Normal pulses.     Heart sounds: Normal heart sounds.  Pulmonary:     Effort: Pulmonary effort is normal.     Breath sounds: Normal breath sounds. No wheezing, rhonchi or rales.     Comments: Frequent nonproductive cough on exam diminished breath sounds bibasilarly Musculoskeletal:        General: Normal range of motion.     Cervical back: Normal range of motion and neck supple.  Skin:    General: Skin is warm and dry.  Neurological:     General: No focal deficit present.     Mental Status: He is alert and oriented to person, place, and time. Mental status is at baseline.  Psychiatric:        Mood and Affect: Mood normal.        Behavior: Behavior normal.        Thought Content: Thought content normal.      UC Treatments / Results  Labs (all labs ordered are listed, but only abnormal results are displayed) Labs Reviewed - No data to display  EKG   Radiology DG Chest 2 View  Result Date: 09/21/2022 CLINICAL DATA:  Cough for 1 month EXAM: CHEST - 2 VIEW COMPARISON:  Chest x-ray 05/29/2022 FINDINGS: The heart size and mediastinal contours are within normal limits. Both lungs are clear. The visualized skeletal structures are unremarkable. IMPRESSION: No active cardiopulmonary disease. Electronically Signed   By: Darliss Cheney M.D.   On: 09/21/2022 16:00   CT ANGIO  CHEST/ABD/PEL FOR DISSECTION W &/OR WO CONTRAST  Result Date: 09/20/2022 CLINICAL DATA:  Aortic aneurysm suspected. EXAM: CT ANGIOGRAPHY CHEST, ABDOMEN AND PELVIS TECHNIQUE: Non-contrast CT of the chest was initially obtained. Multidetector CT imaging through the chest, abdomen and pelvis was performed using the standard protocol during bolus administration of intravenous contrast. Multiplanar reconstructed images and MIPs were obtained and reviewed to evaluate the vascular anatomy. RADIATION DOSE REDUCTION: This exam was performed according to the departmental dose-optimization program which includes automated exposure control, adjustment of the mA and/or kV according to patient size and/or use of iterative reconstruction technique. CONTRAST:  OMNIPAQUE IOHEXOL 350 MG/ML SOLN COMPARISON:  Aortic ultrasound 08/29/2022, abdominopelvic CT 01/23/2021 FINDINGS: CTA CHEST FINDINGS Cardiovascular: Mild fusiform dilatation of the ascending aorta, 4.1 cm. Motion through the aortic root limits aortic root evaluation. Normal caliber transverse and descending thoracic aorta. No dissection. No central pulmonary embolus. The heart is upper normal in size. No pericardial effusion. Coronary artery calcifications are seen. Mediastinum/Nodes: Prominent right hilar nodes at 10 mm, nonspecific. No mediastinal adenopathy. No esophageal wall thickening. Lungs/Pleura: Subsegmental linear atelectasis or scarring within the left lower lobe. Heterogeneous pulmonary parenchyma with central bronchial thickening. No pneumonia or focal airspace disease. No pleural effusion. Trachea and central airways are clear Musculoskeletal: There are no acute or suspicious osseous abnormalities. Slight scoliotic curvature of the upper thoracic spine. No chest wall soft tissue abnormalities. Review of the MIP images confirms the above findings. CTA ABDOMEN AND PELVIS FINDINGS VASCULAR Aorta: No aneurysm. Particularly, no suprarenal aortic aneurysm.  Greatest aortic dimension is 2.2 cm. Celiac: There is a common origin of the celiac and superior mesenteric artery arising from the aorta. No stenosis, dissection, or vasculitis. SMA: Common origin of  the celiac and superior mesenteric artery arising from the aorta. No stenosis, dissection, or vasculitis. Renals: Single right and 3 left renal arteries. All renal arteries are patent. No dissection, stenosis, or evidence of vasculitis. IMA: Patent without evidence of aneurysm, dissection, vasculitis or significant stenosis. Inflow: Patent without evidence of aneurysm, dissection, vasculitis or significant stenosis. Veins: No obvious venous abnormality within the limitations of this arterial phase study. Review of the MIP images confirms the above findings. NON-VASCULAR Hepatobiliary: Diffuse hepatic steatosis. No evidence of focal liver lesion on this arterial phase exam. Gallstones within partially distended gallbladder. No pericholecystic inflammation. No biliary dilatation. Pancreas: Unremarkable. No pancreatic ductal dilatation or surrounding inflammatory changes. Spleen: Normal in size and arterial enhancement. Adrenals/Urinary Tract: Normal adrenal glands. No hydronephrosis. Subcentimeter right renal hypodensity. No further follow-up imaging is recommended. No visible renal calculi. Urinary bladder is nondistended, no bladder thickening. Stomach/Bowel: Stomach is within normal limits. Appendix appears normal. No evidence of bowel wall thickening, distention, or inflammatory changes. Moderate volume of colonic stool. Scattered colonic diverticula without diverticulitis. Lymphatic: No adenopathy. Reproductive: No acute findings, unchanged prostatic calcifications. Other: Moderate fat containing umbilical and small fat containing bilateral inguinal hernias. No ascites. Musculoskeletal: There are no acute or suspicious osseous abnormalities. Review of the MIP images confirms the above findings. IMPRESSION: 1. 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 Recommend annual imaging followup by CTA or MRA. This recommendation follows 2010 ACCF/AHA/AATS/ACR/ASA/SCA/SCAI/SIR/STS/SVM Guidelines for the Diagnosis and Management of Patients with Thoracic Aortic Disease. Circulation. 2010; 121: V253-G644. Aortic aneurysm NOS (ICD10-I71.9) 2. No abdominal aortic aneurysm. Greatest abdominal aortic dimension 2.2 cm. 3. Heterogeneous pulmonary parenchyma with central bronchial thickening, can be seen with small airways disease. 4. Hepatic steatosis. 5. Cholelithiasis without CT findings of acute cholecystitis. 6. Colonic diverticulosis without diverticulitis. 7. Fat containing umbilical and bilateral inguinal hernias. Electronically Signed   By: Narda Rutherford M.D.   On: 09/20/2022 17:03    Procedures Procedures (including critical care time)  Medications Ordered in UC Medications - No data to display  Initial Impression / Assessment and Plan / UC Course  I have reviewed the triage vital signs and the nursing notes.  Pertinent labs & imaging results that were available during my care of the patient were reviewed by me and considered in my medical decision making (see chart for details).     MDM: 1.  Chronic cough-CXR revealed above.  Patient notified of results.  Rx'd Symbicort 80-4.5 mcg/ACT inhaler: Inhale 2 puffs into the lungs twice daily; 2.  Allergic rhinitis, unspecified seasonality, unspecified trigger-Rx'd Allegra 180 mg fexofenadine without D daily x 5 days. Advised patient of chest x-ray results with hardcopy and image provided to patient.  Advised take medications as directed.  Advised may take Allegra daily for the next 5 days, as needed for concurrent postnasal drainage/allergic rhinitis.  Encouraged patient to increase daily water intake to 64 ounces per day while taking these medications.Advised if symptoms worsen and/or unresolved please follow-up with PCP for further evaluation and  possibility of PFT/pulmonary evaluation.  Patient discharged home, hemodynamically stable. Final Clinical Impressions(s) / UC Diagnoses   Final diagnoses:  Chronic cough  Allergic rhinitis, unspecified seasonality, unspecified trigger     Discharge Instructions      Advised patient of chest x-ray results with hardcopy and image provided to patient.  Advised take medications as directed.  Advised may take Allegra daily for the next 5 days, as needed for concurrent postnasal drainage/allergic rhinitis.  Encouraged  patient to increase daily water intake to 64 ounces per day while taking these medications.Advised if symptoms worsen and/or unresolved please follow-up with PCP for further evaluation and possibility of PFT/pulmonary evaluation.       ED Prescriptions     Medication Sig Dispense Auth. Provider   budesonide-formoterol (SYMBICORT) 80-4.5 MCG/ACT inhaler Inhale 2 puffs into the lungs 2 (two) times daily. 1 each Trevor Iha, FNP   fexofenadine [facility] ALLERGY) 180 MG tablet Take 1 tablet (180 mg total) by mouth daily for 15 days. 15 tablet Trevor Iha, FNP      PDMP not reviewed this encounter.   Trevor Iha, FNP 09/21/22 1656

## 2022-09-21 NOTE — Progress Notes (Signed)
[name], CT does show some widening of the ascending aorta measuring approximately 4.1 cm.  It is not technically so they are recommending that we follow this up in a year.  He also noted some increased fat content of the liver we call it fatty liver or the other term is hepatic steatosis.  This can gradually cause some scarring which eventually leads to cirrhosis over time so it is important to continue to work on healthy food choices regular exercise and maintaining a healthy weight to reduce fat content and inflammation in the liver.  Also have some gallstones but we will not worry about those unless you start having problems or pain.  Of note you also have a small hernia at your bellybutton and in both groin areas.  If they are not causing problems or pain I would not recommend any treatment or surgical repair at this point in time.  Certainly if that became a problem in the future we could always address that.

## 2022-10-18 DIAGNOSIS — G4733 Obstructive sleep apnea (adult) (pediatric): Secondary | ICD-10-CM | POA: Diagnosis not present

## 2022-10-18 DIAGNOSIS — G4719 Other hypersomnia: Secondary | ICD-10-CM | POA: Diagnosis not present

## 2022-10-18 DIAGNOSIS — R0609 Other forms of dyspnea: Secondary | ICD-10-CM | POA: Diagnosis not present

## 2022-10-29 DIAGNOSIS — K760 Fatty (change of) liver, not elsewhere classified: Secondary | ICD-10-CM | POA: Diagnosis not present

## 2022-10-29 DIAGNOSIS — K200 Eosinophilic esophagitis: Secondary | ICD-10-CM | POA: Diagnosis not present

## 2022-10-29 DIAGNOSIS — K5790 Diverticulosis of intestine, part unspecified, without perforation or abscess without bleeding: Secondary | ICD-10-CM | POA: Diagnosis not present

## 2022-10-29 DIAGNOSIS — K219 Gastro-esophageal reflux disease without esophagitis: Secondary | ICD-10-CM | POA: Diagnosis not present

## 2022-11-08 ENCOUNTER — Ambulatory Visit: Payer: BC Managed Care – PPO | Admitting: Family Medicine

## 2022-11-12 DIAGNOSIS — R0609 Other forms of dyspnea: Secondary | ICD-10-CM | POA: Diagnosis not present

## 2022-11-12 DIAGNOSIS — G4733 Obstructive sleep apnea (adult) (pediatric): Secondary | ICD-10-CM | POA: Diagnosis not present

## 2022-11-12 DIAGNOSIS — R590 Localized enlarged lymph nodes: Secondary | ICD-10-CM | POA: Diagnosis not present

## 2022-11-12 DIAGNOSIS — G4719 Other hypersomnia: Secondary | ICD-10-CM | POA: Diagnosis not present

## 2022-11-13 DIAGNOSIS — E119 Type 2 diabetes mellitus without complications: Secondary | ICD-10-CM | POA: Diagnosis not present

## 2022-11-13 LAB — HM DIABETES EYE EXAM

## 2022-11-21 DIAGNOSIS — Z1331 Encounter for screening for depression: Secondary | ICD-10-CM | POA: Diagnosis not present

## 2022-11-21 DIAGNOSIS — R079 Chest pain, unspecified: Secondary | ICD-10-CM | POA: Diagnosis not present

## 2022-11-21 DIAGNOSIS — I10 Essential (primary) hypertension: Secondary | ICD-10-CM | POA: Diagnosis not present

## 2022-11-23 ENCOUNTER — Encounter: Payer: Self-pay | Admitting: Family Medicine

## 2022-12-10 ENCOUNTER — Encounter: Payer: Self-pay | Admitting: Emergency Medicine

## 2022-12-10 ENCOUNTER — Ambulatory Visit
Admission: EM | Admit: 2022-12-10 | Discharge: 2022-12-10 | Disposition: A | Discharge status: Home / Self Care | Payer: BC Managed Care – PPO | Attending: Family Medicine | Admitting: Family Medicine

## 2022-12-10 DIAGNOSIS — R109 Unspecified abdominal pain: Secondary | ICD-10-CM

## 2022-12-10 LAB — POCT URINALYSIS DIP (MANUAL ENTRY)
Bilirubin, UA: NEGATIVE
Blood, UA: NEGATIVE
Glucose, UA: 250 mg/dL — AB
Leukocytes, UA: NEGATIVE
Nitrite, UA: NEGATIVE
Spec Grav, UA: >=1.03 — AB (ref 1.010–1.025)
Urobilinogen, UA: 0.2 U/dL
pH, UA: 5.5 (ref 5.0–8.0)

## 2022-12-10 MED ORDER — HYDROCODONE-ACETAMINOPHEN 5-325 MG PO TABS: 1.0000 | ORAL_TABLET | Freq: Three times a day (TID) | ORAL | 0 refills | Status: DC | PRN

## 2022-12-10 NOTE — ED Triage Notes (Signed)
Patient c/o right and left kidney pain. Hx of kidney stones.

## 2022-12-10 NOTE — Discharge Instructions (Addendum)
Advised patient may take Norco daily or as needed for acute flank pain.  Encouraged to increase daily water intake to 64 ounces per day while taking his medication.  Advised if symptoms worsen and/or unresolved please go to [facility] ED for CT/renal stone study to rule out occluded kidney or ureteral stone.

## 2022-12-10 NOTE — ED Provider Notes (Signed)
[facility]    CSN: [id number] Arrival date & time: 12/10/22  1114      History   Chief Complaint No chief complaint on file.   HPI Nicholas Christian is a 57 y.o. male.   HPI Pleasant 57 year old male presents with bilateral flank pain with history of kidney stones. PMH significant for T2DM, diverticulitis, and history of nephrolithiasis.  Past Medical History:  Diagnosis Date   Complication of anesthesia    low O2 sat after kidney surgery ~ 2009   DDD (degenerative disc disease), cervical    Diabetes mellitus without complication (HCC)    Diverticulitis    Eosinophilic esophagitis    hx of- [facility]   GERD (gastroesophageal reflux disease)    High triglycerides    History of hiatal hernia    History of kidney stones    hx   Low HDL (under 40)    Low testosterone    Migraines    Normal cardiac stress test    DOBUTAMINE STRESS ECHO 10 years ago    OSA (obstructive sleep apnea)    no cpap used   Pneumonia 10/15   no problems now    Patient Active Problem List   Diagnosis Date Noted   Lateral femoral cutaneous neuropathy 03/23/2021   Low back pain 03/23/2021   Meralgia paresthetica 03/23/2021   Work stress 07/07/2020   Inattention 08/27/2019   History of lumbar laminectomy 01/22/2019   Primary osteoarthritis of left hip 12/25/2018   Chronic left-sided low back pain without sciatica 07/14/2018   History of diverticulitis 04/22/2018   Calculus of gallbladder without cholecystitis without obstruction 04/22/2018   Non-recurrent bilateral inguinal hernia without obstruction or gangrene 03/17/2018   Umbilical hernia without obstruction and without gangrene 03/17/2018   History of Clostridium difficile colitis 05/09/2015   Herniated nucleus pulposus, thoracic 01/18/2014   Eosinophilic esophagitis 07/18/2011   Obesity 02/16/2011   Controlled type 2 diabetes mellitus without complication, without long-term current use of insulin (HCC) 02/07/2011    OSA (obstructive sleep apnea) 11/16/2009   Essential hypertension, benign 11/16/2009   BACK PAIN, THORACIC REGION 11/24/2007   Diverticulosis of colon 06/25/2007   Fatty liver disease, nonalcoholic 06/25/2007   GAD (generalized anxiety disorder) 11/20/2005   Migraine headache 11/20/2005   Gastroesophageal reflux disease with esophagitis 11/20/2005   KIDNEY STONE - NEPHROLITHIASIS 11/20/2005    Past Surgical History:  Procedure Laterality Date   EDG  01/03/12   ESOPHAGEAL DILATION  07/18/11   Dr. Mariana Kaufman   KIDNEY STONE SURGERY     laser at [facility].    LUMBAR LAMINECTOMY/DECOMPRESSION MICRODISCECTOMY Left 01/18/2014   Procedure: Left Thoracic five-six microdiskectomy;  Surgeon: Tressie Stalker, MD;  Location: [facility];  Service: Neurosurgery;  Laterality: Left;   NASAL SINUS SURGERY     Removal polyps    TONSILLECTOMY AND ADENOIDECTOMY     TYMPANOSTOMY TUBE PLACEMENT         Home Medications    Prior to Admission medications   Medication Sig Start Date End Date Taking? Authorizing Provider  HYDROcodone-acetaminophen (NORCO/VICODIN) 5-325 MG tablet Take 1 tablet by mouth every 8 (eight) hours as needed. 12/10/22  Yes Trevor Iha, FNP  AMBULATORY NON FORMULARY MEDICATION Medication Name: Glucometer and strips and lancet to check once a day.  Dx 250.00 12/10/14   Agapito Games, MD  amphetamine-dextroamphetamine (ADDERALL) 10 MG tablet Take 5 mg by mouth 2 (two) times daily. 04/03/22   [provider]  budesonide-formoterol (  SYMBICORT) 80-4.5 MCG/ACT inhaler Inhale 2 puffs into the lungs 2 (two) times daily. 09/21/22   Trevor Iha, FNP  cholecalciferol (VITAMIN D3) 25 MCG (1000 UT) tablet Take 1,000 Units by mouth daily.    [provider]  dextromethorphan-guaiFENesin (MUCINEX DM) 30-600 MG 12hr tablet Take 1 tablet by mouth 2 (two) times daily.    [provider]  fexofenadine (ALLEGRA ALLERGY) 180 MG tablet Take 1 tablet (180 mg  total) by mouth daily for 15 days. 09/21/22 10/06/22  Trevor Iha, FNP  fluticasone (FLONASE) 50 MCG/ACT nasal spray Place 2 sprays into both nostrils daily. 04/26/22   Waldon Merl, PA-C  fluticasone (VERAMYST) 27.5 MCG/SPRAY nasal spray Place 2 sprays into the nose daily.    [provider]  Insulin Pen Needle (PEN NEEDLES) 31G X 8 MM MISC Use for administraiton of Victoza daily. 06/27/15   Agapito Games, MD  lisinopril (ZESTRIL) 10 MG tablet TAKE 1 TABLET BY MOUTH EVERY DAY 09/17/22   Agapito Games, MD  omeprazole (PRILOSEC) 40 MG capsule Take 1 capsule by mouth. 09/02/18   [provider]  Semaglutide, 2 MG/DOSE, 8 MG/3ML SOPN Inject 2 mg as directed once a week. 05/28/22   Agapito Games, MD    Family History Family History  Problem Relation Age of Onset   Prostate cancer Father        oldr when dx    Diabetes Maternal Grandmother     Social History Social History   Tobacco Use   Smoking status: Never   Smokeless tobacco: Never  Vaping Use   Vaping status: Never Used  Substance Use Topics   Alcohol use: No   Drug use: No     Allergies   Januvia [sitagliptin] and Niacin   Review of Systems Review of Systems   Physical Exam Triage Vital Signs ED Triage Vitals  Encounter Vitals Group     BP 12/10/22 1135 118/85     Systolic BP Percentile --      Diastolic BP Percentile --      Pulse Rate 12/10/22 1135 84     Resp 12/10/22 1135 14     Temp 12/10/22 1135 97.8 F (36.6 C)     Temp Source 12/10/22 1135 Oral     SpO2 12/10/22 1135 96 %     Weight --      Height --      Head Circumference --      Peak Flow --      Pain Score 12/10/22 1134 4     Pain Loc --      Pain Education --      Exclude from Growth Chart --    No data found.  Updated Vital Signs BP 118/85 (BP Location: Left Arm)   Pulse 84   Temp 97.8 F (36.6 C) (Oral)   Resp 14   SpO2 96%    Physical Exam Vitals and nursing note reviewed.   Constitutional:      Appearance: Normal appearance. He is obese.  HENT:     Head: Normocephalic and atraumatic.     Mouth/Throat:     Mouth: Mucous membranes are moist.     Pharynx: Oropharynx is clear.  Eyes:     Extraocular Movements: Extraocular movements intact.     Conjunctiva/sclera: Conjunctivae normal.     Pupils: Pupils are equal, round, and reactive to light.  Cardiovascular:     Rate and Rhythm: Normal rate and regular rhythm.  Pulses: Normal pulses.     Heart sounds: Normal heart sounds.  Pulmonary:     Effort: Pulmonary effort is normal.     Breath sounds: Normal breath sounds. No wheezing, rhonchi or rales.  Abdominal:     Tenderness: There is right CVA tenderness and left CVA tenderness.  Musculoskeletal:        General: Normal range of motion.     Cervical back: Normal range of motion and neck supple.  Skin:    General: Skin is warm and dry.  Neurological:     General: No focal deficit present.     Mental Status: He is alert and oriented to person, place, and time.  Psychiatric:        Mood and Affect: Mood normal.        Behavior: Behavior normal.        Thought Content: Thought content normal.      UC Treatments / Results  Labs (all labs ordered are listed, but only abnormal results are displayed) Labs Reviewed  POCT URINALYSIS DIP (MANUAL ENTRY) - Abnormal; Notable for the following components:      Result Value   Clarity, UA cloudy (*)    Glucose, UA =250 (*)    Ketones, POC UA trace (5) (*)    Spec Grav, UA >=1.030 (*)    Protein Ur, POC trace (*)    All other components within normal limits    EKG   Radiology No results found.  Procedures Procedures (including critical care time)  Medications Ordered in UC Medications - No data to display  Initial Impression / Assessment and Plan / UC Course  I have reviewed the triage vital signs and the nursing notes.  Pertinent labs & imaging results that were available during my care of  the patient were reviewed by me and considered in my medical decision making (see chart for details).     MDM: 1.  Bilateral flank pain-Rx'd Norco 5/325 mg tablet: Take 1 tablet every 8 hours as needed for bilateral flank pain.  UA revealed above. Advised patient may take Norco daily or as needed for acute flank pain.  Encouraged to increase daily water intake to 64 ounces per day while taking his medication.  Advised if symptoms worsen and/or unresolved please go to [facility] ED for CT/renal stone study to rule out occluded kidney or ureteral stone.  Patient discharged home, hemodynamically stable. Final Clinical Impressions(s) / UC Diagnoses   Final diagnoses:  Bilateral flank pain     Discharge Instructions      Advised patient may take Norco daily or as needed for acute flank pain.  Encouraged to increase daily water intake to 64 ounces per day while taking his medication.  Advised if symptoms worsen and/or unresolved please go to [facility] ED for CT/renal stone study to rule out occluded kidney or ureteral stone.     ED Prescriptions     Medication Sig Dispense Auth. Provider   HYDROcodone-acetaminophen (NORCO/VICODIN) 5-325 MG tablet Take 1 tablet by mouth every 8 (eight) hours as needed. 12 tablet Trevor Iha, FNP      I have reviewed the PDMP during this encounter.   Trevor Iha, FNP 12/10/22 1222

## 2022-12-16 ENCOUNTER — Other Ambulatory Visit: Payer: Self-pay | Admitting: Family Medicine

## 2022-12-16 DIAGNOSIS — I10 Essential (primary) hypertension: Secondary | ICD-10-CM

## 2022-12-21 DIAGNOSIS — G4733 Obstructive sleep apnea (adult) (pediatric): Secondary | ICD-10-CM | POA: Diagnosis not present

## 2022-12-28 ENCOUNTER — Encounter: Payer: Self-pay | Admitting: Family Medicine

## 2022-12-28 ENCOUNTER — Ambulatory Visit: Payer: BC Managed Care – PPO | Admitting: Family Medicine

## 2022-12-28 VITALS — BP 119/73 | HR 85 | Ht 71.0 in | Wt 227.0 lb

## 2022-12-28 DIAGNOSIS — I10 Essential (primary) hypertension: Secondary | ICD-10-CM

## 2022-12-28 DIAGNOSIS — E119 Type 2 diabetes mellitus without complications: Secondary | ICD-10-CM | POA: Diagnosis not present

## 2022-12-28 DIAGNOSIS — Z125 Encounter for screening for malignant neoplasm of prostate: Secondary | ICD-10-CM | POA: Diagnosis not present

## 2022-12-28 DIAGNOSIS — G4733 Obstructive sleep apnea (adult) (pediatric): Secondary | ICD-10-CM | POA: Diagnosis not present

## 2022-12-28 LAB — POCT GLYCOSYLATED HEMOGLOBIN (HGB A1C): Hemoglobin A1C: 7.8 % — AB (ref 4.0–5.6)

## 2022-12-28 LAB — POCT UA - MICROALBUMIN
Creatinine, POC: 300 mg/dL
Microalbumin Ur, POC: 80 mg/L

## 2022-12-28 MED ORDER — TIRZEPATIDE 7.5 MG/0.5ML ~~LOC~~ SOAJ
7.5000 mg | SUBCUTANEOUS | 0 refills | Status: DC
Start: 2022-12-28 — End: 2023-04-01

## 2022-12-28 NOTE — Assessment & Plan Note (Signed)
Has not been eligible to tolerate CPAP so is now looking into the inspire device with ENT.

## 2022-12-28 NOTE — Assessment & Plan Note (Signed)
Well controlled. Continue current regimen. Follow up in  6 mo  

## 2022-12-28 NOTE — Assessment & Plan Note (Signed)
1C is uncontrolled today it was 6.6 last time and at 7.8 today says he really has not made any major changes and he is still using the 2 mg Ozempic.  Working to try to switch him to [facility] he does not really like the Ozempic he gets nauseated for several days afterwards and does not feel great on it plus his A1c is not well-controlled.  Just encouraged him to make sure he is eating protein first veggies second and carbs last.

## 2022-12-28 NOTE — Progress Notes (Signed)
Established Patient Office Visit  Subjective   Patient ID: Nicholas Christian, male    DOB: 21-Mar-1965  Age: 57 y.o. MRN: [id number]  Chief Complaint  Patient presents with   Diabetes    HPI  Diabetes - no hypoglycemic events. No wounds or sores that are not healing well. No increased thirst or urination. Checking glucose at home. Taking medications as prescribed without any side effects.gets nauseated for several days after the Ozempic shot.  Gets hiccups as well. GI feels some of the sxs are from the medication.   Hypertension- Pt denies chest pain, SOB, dizziness, or heart palpitations.  Taking meds as directed w/o problems.  Denies medication side effects.       ROS    Objective:     BP 119/73   Pulse 85   Ht 5\' 11"  (1.803 m)   Wt 227 lb (103 kg)   SpO2 99%   BMI 31.66 kg/m    Physical Exam Vitals and nursing note reviewed.  Constitutional:      Appearance: Normal appearance.  HENT:     Head: Normocephalic and atraumatic.  Eyes:     Conjunctiva/sclera: Conjunctivae normal.  Cardiovascular:     Rate and Rhythm: Normal rate and regular rhythm.  Pulmonary:     Effort: Pulmonary effort is normal.     Breath sounds: Normal breath sounds.  Skin:    General: Skin is warm and dry.  Neurological:     Mental Status: He is alert.  Psychiatric:        Mood and Affect: Mood normal.     Results for orders placed or performed in visit on 12/28/22  POCT HgB A1C  Result Value Ref Range   Hemoglobin A1C 7.8 (A) 4.0 - 5.6 %   HbA1c POC (<> result, manual entry)     HbA1c, POC (prediabetic range)     HbA1c, POC (controlled diabetic range)    POCT UA - Microalbumin  Result Value Ref Range   Microalbumin Ur, POC 80 mg/L   Creatinine, POC 300 mg/dL   Albumin/Creatinine Ratio, Urine, POC 30-300       The 10-year ASCVD risk score (Arnett DK, et al., 2019) is: 9.9%    Assessment & Plan:   Problem List Items Addressed This Visit       Cardiovascular and  Mediastinum   Essential hypertension, benign    Well controlled. Continue current regimen. Follow up in  6 mo       Relevant Orders   CMP14+EGFR   Lipid panel   CBC   PSA     Respiratory   OSA (obstructive sleep apnea)    Has not been eligible to tolerate CPAP so is now looking into the inspire device with ENT.        Endocrine   Controlled type 2 diabetes mellitus without complication, without long-term current use of insulin (HCC) - Primary    1C is uncontrolled today it was 6.6 last time and at 7.8 today says he really has not made any major changes and he is still using the 2 mg Ozempic.  Working to try to switch him to [facility] he does not really like the Ozempic he gets nauseated for several days afterwards and does not feel great on it plus his A1c is not well-controlled.  Just encouraged him to make sure he is eating protein first veggies second and carbs last.      Relevant Medications   tirzepatide (MOUNJARO) 7.5  MG/0.5ML Pen   Other Relevant Orders   POCT HgB A1C (Completed)   POCT UA - Microalbumin (Completed)   CMP14+EGFR   Lipid panel   CBC   PSA   Other Visit Diagnoses     Screening for malignant neoplasm of prostate       Relevant Orders   CMP14+EGFR   Lipid panel   CBC   PSA       Return in about 3 months (around 03/30/2023) for Diabetes follow-up.    Nani Gasser, MD

## 2023-01-01 DIAGNOSIS — R590 Localized enlarged lymph nodes: Secondary | ICD-10-CM | POA: Diagnosis not present

## 2023-01-01 DIAGNOSIS — R0602 Shortness of breath: Secondary | ICD-10-CM | POA: Diagnosis not present

## 2023-01-01 DIAGNOSIS — G4733 Obstructive sleep apnea (adult) (pediatric): Secondary | ICD-10-CM | POA: Diagnosis not present

## 2023-01-01 DIAGNOSIS — J3089 Other allergic rhinitis: Secondary | ICD-10-CM | POA: Diagnosis not present

## 2023-01-08 DIAGNOSIS — R918 Other nonspecific abnormal finding of lung field: Secondary | ICD-10-CM | POA: Diagnosis not present

## 2023-01-08 DIAGNOSIS — I77810 Thoracic aortic ectasia: Secondary | ICD-10-CM | POA: Diagnosis not present

## 2023-01-08 DIAGNOSIS — I2510 Atherosclerotic heart disease of native coronary artery without angina pectoris: Secondary | ICD-10-CM | POA: Diagnosis not present

## 2023-01-08 DIAGNOSIS — I517 Cardiomegaly: Secondary | ICD-10-CM | POA: Diagnosis not present

## 2023-01-08 DIAGNOSIS — J811 Chronic pulmonary edema: Secondary | ICD-10-CM | POA: Diagnosis not present

## 2023-01-09 ENCOUNTER — Telehealth: Payer: Self-pay

## 2023-01-13 NOTE — Telephone Encounter (Signed)
This note is blank. Please resend.

## 2023-01-14 ENCOUNTER — Telehealth: Payer: Self-pay | Admitting: Family Medicine

## 2023-01-14 NOTE — Telephone Encounter (Signed)
Copied from CRM [id number]. Topic: Medical Record Request - Provider/Facility Request >> Jan 09, 2023  3:52 PM Fuller Mandril wrote: Reason for CRM: Andi Hence from [facility] in [place] called. States Pt was seen by them yesterday for CT Scan and provider is requesting a copy of last imaging that was completed in August 2024. They use powershare.  If unable to send via [employer] can mail to [address]. [place], [place] [address] or call [phone] with any questions or concerns. Closed this Friday.

## 2023-01-14 NOTE — Telephone Encounter (Signed)
Task completed. Tried calling Maya ([facility]), no answer. Left a detailed vm msg. Contacted the patient - aware that he will need to reach the imaging department for the disk regarding last imaging test. Patient had the CT scan on 01/08/23. Results available in Care Everywhere.

## 2023-01-15 ENCOUNTER — Telehealth: Payer: Self-pay

## 2023-01-15 NOTE — Telephone Encounter (Signed)
We do not have the images. They will need to call the imaging department. Please call and give them the imaging department phone number. [phone]

## 2023-01-15 NOTE — Telephone Encounter (Signed)
Copied from CRM [id number]. Topic: Medical Record Request - Provider/Facility Request >> Jan 09, 2023  3:52 PM Fuller Mandril wrote: Reason for CRM: Andi Hence from [facility] in [place] called. States Pt was seen by them yesterday for CT Scan and provider is requesting a copy of last imaging that was completed in August 2024. They use powershare.  If unable to send via [employer] can mail to [address]. [place], [place] [address] or call [phone] with any questions or concerns. Closed this Friday.

## 2023-01-28 DIAGNOSIS — E119 Type 2 diabetes mellitus without complications: Secondary | ICD-10-CM | POA: Diagnosis not present

## 2023-01-28 DIAGNOSIS — Z125 Encounter for screening for malignant neoplasm of prostate: Secondary | ICD-10-CM | POA: Diagnosis not present

## 2023-01-28 DIAGNOSIS — I10 Essential (primary) hypertension: Secondary | ICD-10-CM | POA: Diagnosis not present

## 2023-01-29 ENCOUNTER — Encounter: Payer: Self-pay | Admitting: Family Medicine

## 2023-01-29 ENCOUNTER — Ambulatory Visit: Payer: BC Managed Care – PPO | Admitting: Family Medicine

## 2023-01-29 VITALS — BP 125/68 | HR 91 | Ht 71.0 in | Wt 233.0 lb

## 2023-01-29 DIAGNOSIS — E1169 Type 2 diabetes mellitus with other specified complication: Secondary | ICD-10-CM | POA: Diagnosis not present

## 2023-01-29 DIAGNOSIS — E119 Type 2 diabetes mellitus without complications: Secondary | ICD-10-CM | POA: Diagnosis not present

## 2023-01-29 DIAGNOSIS — K760 Fatty (change of) liver, not elsewhere classified: Secondary | ICD-10-CM

## 2023-01-29 DIAGNOSIS — I77819 Aortic ectasia, unspecified site: Secondary | ICD-10-CM | POA: Insufficient documentation

## 2023-01-29 DIAGNOSIS — R109 Unspecified abdominal pain: Secondary | ICD-10-CM

## 2023-01-29 DIAGNOSIS — I10 Essential (primary) hypertension: Secondary | ICD-10-CM

## 2023-01-29 DIAGNOSIS — K8020 Calculus of gallbladder without cholecystitis without obstruction: Secondary | ICD-10-CM

## 2023-01-29 DIAGNOSIS — E785 Hyperlipidemia, unspecified: Secondary | ICD-10-CM

## 2023-01-29 DIAGNOSIS — I2510 Atherosclerotic heart disease of native coronary artery without angina pectoris: Secondary | ICD-10-CM | POA: Diagnosis not present

## 2023-01-29 LAB — CMP14+EGFR
ALT: 39 [IU]/L (ref 0–44)
AST: 29 [IU]/L (ref 0–40)
Albumin: 4.6 g/dL (ref 3.8–4.9)
Alkaline Phosphatase: 90 [IU]/L (ref 44–121)
BUN/Creatinine Ratio: 10 (ref 9–20)
BUN: 12 mg/dL (ref 6–24)
Bilirubin Total: 0.8 mg/dL (ref 0.0–1.2)
CO2: 23 mmol/L (ref 20–29)
Calcium: 9.5 mg/dL (ref 8.7–10.2)
Chloride: 103 mmol/L (ref 96–106)
Creatinine, Ser: 1.17 mg/dL (ref 0.76–1.27)
Globulin, Total: 2.2 g/dL (ref 1.5–4.5)
Glucose: 139 mg/dL — ABNORMAL HIGH (ref 70–99)
Potassium: 4.3 mmol/L (ref 3.5–5.2)
Sodium: 145 mmol/L — ABNORMAL HIGH (ref 134–144)
Total Protein: 6.8 g/dL (ref 6.0–8.5)
eGFR: 73 mL/min/{1.73_m2} (ref >59)

## 2023-01-29 LAB — CBC
Hematocrit: 49.2 % (ref 37.5–51.0)
Hemoglobin: 16.2 g/dL (ref 13.0–17.7)
MCH: 27.8 pg (ref 26.6–33.0)
MCHC: 32.9 g/dL (ref 31.5–35.7)
MCV: 85 fL (ref 79–97)
Platelets: 186 10*3/uL (ref 150–450)
RBC: 5.82 x10E6/uL — ABNORMAL HIGH (ref 4.14–5.80)
RDW: 13.1 % (ref 11.6–15.4)
WBC: 7.4 10*3/uL (ref 3.4–10.8)

## 2023-01-29 LAB — LIPID PANEL
Chol/HDL Ratio: 5.7 {ratio} — ABNORMAL HIGH (ref 0.0–5.0)
Cholesterol, Total: 206 mg/dL — ABNORMAL HIGH (ref 100–199)
HDL: 36 mg/dL — ABNORMAL LOW (ref >39)
LDL Chol Calc (NIH): 130 mg/dL — ABNORMAL HIGH (ref 0–99)
Triglycerides: 224 mg/dL — ABNORMAL HIGH (ref 0–149)
VLDL Cholesterol Cal: 40 mg/dL (ref 5–40)

## 2023-01-29 LAB — PSA: Prostate Specific Ag, Serum: 2.9 ng/mL (ref 0.0–4.0)

## 2023-01-29 MED ORDER — ATORVASTATIN CALCIUM 20 MG PO TABS: 20.0000 mg | ORAL_TABLET | Freq: Every day | ORAL | 3 refills | Status: DC

## 2023-01-29 NOTE — Progress Notes (Signed)
Pt reports that he has been experiencing L sided low back pain that has been on and off for sometime.   He rates the pain 3/10 but its not really painful its sore,achy. It feels like he pulled a muscle.   He had his labs done yesterday and he also had a CT done at [facility] that he wanted Dr. Linford Arnold to view.

## 2023-01-29 NOTE — Assessment & Plan Note (Signed)
Pressure look great today.

## 2023-01-29 NOTE — Assessment & Plan Note (Signed)
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

## 2023-01-29 NOTE — Assessment & Plan Note (Addendum)
Will check to see if medication is still under review for prior authorization.  In the short-term continue to really put a lot of focus on improving diet.  He says he is cut out all sweet drinks since he was last here.  Fortunately the A1c is up though from prior.  He did do well on the exam back it just caused significant nausea that he could really never get past.  So I think trying an alternative product would be really helpful for him.  Lab Results  Component Value Date   HGBA1C 7.8 (A) 12/28/2022

## 2023-01-29 NOTE — Progress Notes (Signed)
Sodium elevated by one point. Will monitor.  Liver looks good. Cholesterol is high. Please restart statin.  Blood count and prostate test is normal.   The 10-year ASCVD risk score (Arnett DK, et al., 2019) is: 17.2%   Values used to calculate the score:     Age: 57 years     Sex: Male     Is Non-Hispanic African American: No     Diabetic: Yes     Tobacco smoker: No     Systolic Blood Pressure: 125 mmHg     Is BP treated: Yes     HDL Cholesterol: 36 mg/dL     Total Cholesterol: 206 mg/dL

## 2023-01-29 NOTE — Assessment & Plan Note (Signed)
CT notes coronary artery calcification and he does have hyperlipidemia.  Encouraged him to restart his statin he has been off of it for about 6 months.  New prescription sent to pharmacy.

## 2023-01-29 NOTE — Assessment & Plan Note (Signed)
That he liver disease again noted on CT of the chest with contrast performed at [facility] January 10, 2023.  He has had known fatty liver disease since 2009 continue to work on healthy diet and weight loss.

## 2023-01-29 NOTE — Assessment & Plan Note (Signed)
Noted on recent CT.  Currently asymptomatic we did discuss warning signs and symptoms.

## 2023-01-29 NOTE — Progress Notes (Signed)
Established Patient Office Visit  Subjective  Patient ID: Nicholas Christian, male    DOB: [birth year]  Age: 57 y.o. MRN: [id number]  Chief Complaint  Patient presents with   Flank Pain    HPI  Is here today because he is had intermittent left-sided flank pain on and off since October 28.  Initially he went to urgent care thinking he might be passing a kidney stone he says it gradually just eased off so he did not think much about it but it has been coming and going since then.  He has been able to bend lift and twist without any aggravation of symptoms.  So he does not really feel like it is coming from his back.  He has had prior kidney stones no gross hematuria.  Also recently had a chest CT at the end of November that he wanted me to take a look at he had some abnormal findings on their.  He ran out of the Ozempic about 3 weeks ago and was hoping we can get the [facility] covered he gets significant nausea with the Ozempic so really wanted to try different GLP-1.    ROS    Objective:     BP 125/68   Pulse 91   Ht 5\' 11"  (1.803 m)   Wt 233 lb (105.7 kg)   SpO2 98%   BMI 32.50 kg/m    Physical Exam   No results found for any visits on 01/29/23.    The 10-year ASCVD risk score (Arnett DK, et al., 2019) is: 17.2%    Assessment & Plan:   Problem List Items Addressed This Visit       Cardiovascular and Mediastinum   Essential hypertension, benign   Pressure look great today.      Relevant Medications   atorvastatin (LIPITOR) 20 MG tablet   Coronary artery calcification   CT notes coronary artery calcification and he does have hyperlipidemia.  Encouraged him to restart his statin he has been off of it for about 6 months.  New prescription sent to pharmacy.      Relevant Medications   atorvastatin (LIPITOR) 20 MG tablet   Aortic ectasia (HCC)   CT chest November 2024 at [facility] health showed dilated ascending thoracic aorta measuring 4.3 cm      Relevant  Medications   atorvastatin (LIPITOR) 20 MG tablet     Digestive   Fatty liver disease, nonalcoholic   That he liver disease again noted on CT of the chest with contrast performed at [facility] January 10, 2023.  He has had known fatty liver disease since 2009 continue to work on healthy diet and weight loss.      Calculus of gallbladder without cholecystitis without obstruction   Noted on recent CT.  Currently asymptomatic we did discuss warning signs and symptoms.        Endocrine   Controlled type 2 diabetes mellitus without complication, without long-term current use of insulin (HCC)   Will check to see if medication is still under review for prior authorization.  In the short-term continue to really put a lot of focus on improving diet.  He says he is cut out all sweet drinks since he was last here.  Fortunately the A1c is up though from prior.  He did do well on the exam back it just caused significant nausea that he could really never get past.  So I think trying an alternative product would be really helpful for  him.  Lab Results  Component Value Date   HGBA1C 7.8 (A) 12/28/2022         Relevant Medications   atorvastatin (LIPITOR) 20 MG tablet   Other Visit Diagnoses       Left flank pain    -  Primary   Relevant Orders   CT RENAL STONE STUDY     Abdominal pain, unspecified abdominal location       Relevant Orders   CT RENAL STONE STUDY     Hyperlipidemia associated with type 2 diabetes mellitus (HCC)       Relevant Medications   atorvastatin (LIPITOR) 20 MG tablet       Left flank pain-at this point it has been intermittent for 2 months he really does not feel like it is musculoskeletal will get CT stone protocol for further workup.  Recent CT of the chest also noted shotty lymph nodes in the hila with conglomerate nodes in the right hilum measuring up to 1 cm safe with felt like it was most consistent with reactive lymphadenopathy.  He is not currently having  any cough or other chest symptoms they also noted some mild infiltrates in the perihilar region with some atelectasis and scarring at the lower lungs.  Also noted to have some cholelithiasis.  No follow-ups on file.    Nani Gasser, MD

## 2023-01-30 DIAGNOSIS — K5730 Diverticulosis of large intestine without perforation or abscess without bleeding: Secondary | ICD-10-CM | POA: Diagnosis not present

## 2023-01-30 DIAGNOSIS — K760 Fatty (change of) liver, not elsewhere classified: Secondary | ICD-10-CM | POA: Diagnosis not present

## 2023-01-30 DIAGNOSIS — K200 Eosinophilic esophagitis: Secondary | ICD-10-CM | POA: Diagnosis not present

## 2023-01-30 DIAGNOSIS — K219 Gastro-esophageal reflux disease without esophagitis: Secondary | ICD-10-CM | POA: Diagnosis not present

## 2023-02-01 ENCOUNTER — Ambulatory Visit (INDEPENDENT_AMBULATORY_CARE_PROVIDER_SITE_OTHER): Payer: BC Managed Care – PPO

## 2023-02-01 DIAGNOSIS — R109 Unspecified abdominal pain: Secondary | ICD-10-CM

## 2023-02-01 DIAGNOSIS — N200 Calculus of kidney: Secondary | ICD-10-CM | POA: Diagnosis not present

## 2023-02-01 DIAGNOSIS — K5790 Diverticulosis of intestine, part unspecified, without perforation or abscess without bleeding: Secondary | ICD-10-CM | POA: Diagnosis not present

## 2023-02-01 DIAGNOSIS — K760 Fatty (change of) liver, not elsewhere classified: Secondary | ICD-10-CM

## 2023-02-01 DIAGNOSIS — K8020 Calculus of gallbladder without cholecystitis without obstruction: Secondary | ICD-10-CM | POA: Diagnosis not present

## 2023-03-05 ENCOUNTER — Encounter: Payer: Self-pay | Admitting: Family Medicine

## 2023-03-05 NOTE — Telephone Encounter (Signed)
I guess we need to check on the prior authorization for this?  Was sent so if he did not actually receive it am assuming it probably needs an authorization but not sure?  We can reach out to the patient and get further detail if needed?  What medicine is he taking is this something he was prescribed before?  I would suspect what ever he is taking the dose is way too high if it is causing him to vomit.

## 2023-03-06 NOTE — Telephone Encounter (Signed)
I left a message for a call back

## 2023-03-06 NOTE — Telephone Encounter (Signed)
Okay, sounds like we just need to work on his prior authorization for the [facility].

## 2023-04-01 ENCOUNTER — Ambulatory Visit: Payer: BC Managed Care – PPO | Admitting: Family Medicine

## 2023-04-30 DIAGNOSIS — K219 Gastro-esophageal reflux disease without esophagitis: Secondary | ICD-10-CM | POA: Diagnosis not present

## 2023-04-30 DIAGNOSIS — K5730 Diverticulosis of large intestine without perforation or abscess without bleeding: Secondary | ICD-10-CM | POA: Diagnosis not present

## 2023-04-30 DIAGNOSIS — K200 Eosinophilic esophagitis: Secondary | ICD-10-CM | POA: Diagnosis not present

## 2023-04-30 DIAGNOSIS — K760 Fatty (change of) liver, not elsewhere classified: Secondary | ICD-10-CM | POA: Diagnosis not present

## 2023-04-30 MED ORDER — TIRZEPATIDE 2.5 MG/0.5ML ~~LOC~~ SOAJ: 2.5000 mg | SUBCUTANEOUS | 0 refills | Status: DC

## 2023-04-30 MED ORDER — METFORMIN HCL ER 500 MG PO TB24: 500.0000 mg | ORAL_TABLET | Freq: Two times a day (BID) | ORAL | 1 refills | Status: DC

## 2023-04-30 NOTE — Telephone Encounter (Signed)
 Not really sure what is happening.  The initial medication was sent for 7.5 mg back in the fall because he was converting [place] medication to the other.  Since he has been completely off I went ahead and sent a new prescription for Mounjaro 2.5 mg today.  I will reforward this to [facility] for prior authorization.  In the short-term I did go ahead and send in a prescription for metformin for him to take until we can see if we can get this approved.  I do not know what else to do to get this moved forward with approval.  Meds ordered this encounter  Medications   tirzepatide [facility]) 2.5 MG/0.5ML Pen    Sig: Inject 2.5 mg into the skin once a week.    Dispense:  2 mL    Refill:  0   metFORMIN (GLUCOPHAGE-XR) 500 MG 24 hr tablet    Sig: Take 1 tablet (500 mg total) by mouth 2 (two) times daily with a meal.    Dispense:  180 tablet    Refill:  1

## 2023-05-08 ENCOUNTER — Telehealth: Payer: Self-pay | Admitting: Pharmacy Technician

## 2023-05-08 ENCOUNTER — Other Ambulatory Visit (HOSPITAL_COMMUNITY): Payer: Self-pay

## 2023-05-08 NOTE — Telephone Encounter (Signed)
 Pharmacy Patient Advocate Encounter   Received notification from Onbase that prior authorization for [facility] 2.5MG /0.5ML auto-injectors is required/requested.   Insurance verification completed.   The patient is insured through  [facility]  .   Per test claim: PA required; PA submitted to above mentioned insurance via CoverMyMeds Key/confirmation #/EOC [id number] Status is pending

## 2023-05-10 ENCOUNTER — Other Ambulatory Visit (HOSPITAL_COMMUNITY): Payer: Self-pay

## 2023-05-10 NOTE — Telephone Encounter (Signed)
 Pharmacy Patient Advocate Encounter  Received notification from  [facility]  that Prior Authorization for [facility] 2.5MG /0.5ML auto-injectors has been APPROVED from 05/09/2023 to 05/08/2024. Unable to obtain price due to refill too soon rejection, last fill date 05/09/2023 next available fill date04/19/2025   PA #/Case ID/Reference #: Key: [id number]

## 2023-05-22 NOTE — Telephone Encounter (Signed)
 PA for [facility] approved from 05/09/23 - 05/08/24.

## 2023-06-06 ENCOUNTER — Other Ambulatory Visit: Payer: Self-pay | Admitting: Family Medicine

## 2023-06-06 ENCOUNTER — Ambulatory Visit: Admitting: Family Medicine

## 2023-06-23 ENCOUNTER — Other Ambulatory Visit: Payer: Self-pay | Admitting: Family Medicine

## 2023-06-23 DIAGNOSIS — I10 Essential (primary) hypertension: Secondary | ICD-10-CM

## 2023-06-29 ENCOUNTER — Other Ambulatory Visit: Payer: Self-pay | Admitting: Family Medicine

## 2023-07-01 ENCOUNTER — Other Ambulatory Visit: Payer: Self-pay | Admitting: *Deleted

## 2023-07-01 MED ORDER — MOUNJARO 2.5 MG/0.5ML ~~LOC~~ SOAJ: 2.5000 mg | SUBCUTANEOUS | Status: DC

## 2023-07-04 DIAGNOSIS — R590 Localized enlarged lymph nodes: Secondary | ICD-10-CM | POA: Diagnosis not present

## 2023-07-25 ENCOUNTER — Encounter: Payer: Self-pay | Admitting: Family Medicine

## 2023-07-25 ENCOUNTER — Ambulatory Visit: Admitting: Family Medicine

## 2023-07-25 VITALS — BP 129/89 | HR 94 | Ht 71.0 in | Wt 233.0 lb

## 2023-07-25 DIAGNOSIS — E119 Type 2 diabetes mellitus without complications: Secondary | ICD-10-CM

## 2023-07-25 DIAGNOSIS — L989 Disorder of the skin and subcutaneous tissue, unspecified: Secondary | ICD-10-CM | POA: Diagnosis not present

## 2023-07-25 DIAGNOSIS — R590 Localized enlarged lymph nodes: Secondary | ICD-10-CM | POA: Diagnosis not present

## 2023-07-25 DIAGNOSIS — R0609 Other forms of dyspnea: Secondary | ICD-10-CM | POA: Diagnosis not present

## 2023-07-25 DIAGNOSIS — R9389 Abnormal findings on diagnostic imaging of other specified body structures: Secondary | ICD-10-CM | POA: Diagnosis not present

## 2023-07-25 DIAGNOSIS — Z7984 Long term (current) use of oral hypoglycemic drugs: Secondary | ICD-10-CM

## 2023-07-25 DIAGNOSIS — R918 Other nonspecific abnormal finding of lung field: Secondary | ICD-10-CM | POA: Diagnosis not present

## 2023-07-25 DIAGNOSIS — G4733 Obstructive sleep apnea (adult) (pediatric): Secondary | ICD-10-CM | POA: Diagnosis not present

## 2023-07-25 LAB — POCT GLYCOSYLATED HEMOGLOBIN (HGB A1C): Hemoglobin A1C: 9.7 % — AB (ref 4.0–5.6)

## 2023-07-25 MED ORDER — TIRZEPATIDE 2.5 MG/0.5ML ~~LOC~~ SOAJ: 2.5000 mg | SUBCUTANEOUS | 0 refills | Status: DC

## 2023-07-25 MED ORDER — TIRZEPATIDE 5 MG/0.5ML ~~LOC~~ SOAJ: 5.0000 mg | SUBCUTANEOUS | 0 refills | Status: DC

## 2023-07-25 MED ORDER — MOUNJARO 7.5 MG/0.5ML ~~LOC~~ SOAJ: 7.5000 mg | SUBCUTANEOUS | 0 refills | Status: DC

## 2023-07-25 MED ORDER — MOUNJARO 2.5 MG/0.5ML ~~LOC~~ SOAJ: 2.5000 mg | SUBCUTANEOUS | Status: DC

## 2023-07-25 NOTE — Assessment & Plan Note (Signed)
 Uncontrolled diabetes.  We discussed that the tirzepatide really has to be tapered upward to reach full efficacy so far he did tolerate the 2.5 mg well compared to the Ozempic  which is great so I think this may be a better choice for him but we really need to maximize his dose to get his eating and portion control under better control.  Just encourage him to continue to work on healthy food choices.  And trying to exercise and stay active.  Right now he is not currently engaged in routine exercise.  I did send over the 2.5 for the 1 month then 5 mg for 1 month and then 7.5 more milligrams for another month he does get a little bit of a metallic taste in his mouth with the medication.  If we need to stay at a dose for a little longer he can let me know.

## 2023-07-25 NOTE — Progress Notes (Signed)
 Established Patient Office Visit  Subjective  Patient ID: Nicholas Christian, male    DOB: 23-Mar-1965  Age: 58 y.o. MRN: [id number]  Chief Complaint  Patient presents with   Diabetes    HPI   Diabetes - no hypoglycemic events. No wounds or sores that are not healing well. No increased thirst or urination. Checking glucose at home. Taking medications as prescribed without any side effects. He has been out to Mounjaro x 3 weeks.  He cut our sugary drinks but not exercising.    Never head back from dermatology.  Did let me know he is also planning on doing some type of paleo diet through a [place] [facility] center which is primarily protein and veggies and no carbs..  He says that some type of 51-month program.    ROS    Objective:     BP 129/89   Pulse 94   Ht 5' 11 (1.803 m)   Wt 233 lb 0.6 oz (105.7 kg)   SpO2 97%   BMI 32.50 kg/m    Physical Exam Vitals and nursing note reviewed.  Constitutional:      Appearance: Normal appearance.  HENT:     Head: Normocephalic and atraumatic.   Eyes:     Conjunctiva/sclera: Conjunctivae normal.    Cardiovascular:     Rate and Rhythm: Normal rate and regular rhythm.  Pulmonary:     Effort: Pulmonary effort is normal.     Breath sounds: Normal breath sounds.   Skin:    General: Skin is warm and dry.     Comments: On the forehead there is a small keratotic lesion.  He says he can usually get it to go away but then it always comes back.  On the right facial cheek just lateral to the nose there is 2 lesions beside each other that are slightly raised, skin colored and slightly shiny.   Neurological:     Mental Status: He is alert.   Psychiatric:        Mood and Affect: Mood normal.      Results for orders placed or performed in visit on 07/25/23  POCT HgB A1C  Result Value Ref Range   Hemoglobin A1C 9.7 (A) 4.0 - 5.6 %   HbA1c POC (<> result, manual entry)     HbA1c, POC (prediabetic range)     HbA1c, POC  (controlled diabetic range)        The 10-year ASCVD risk score (Arnett DK, et al., 2019) is: 19.4%    Assessment & Plan:   Problem List Items Addressed This Visit       Endocrine   Controlled type 2 diabetes mellitus without complication, without long-term current use of insulin  (HCC) - Primary   Uncontrolled diabetes.  We discussed that the tirzepatide really has to be tapered upward to reach full efficacy so far he did tolerate the 2.5 mg well compared to the Ozempic  which is great so I think this may be a better choice for him but we really need to maximize his dose to get his eating and portion control under better control.  Just encourage him to continue to work on healthy food choices.  And trying to exercise and stay active.  Right now he is not currently engaged in routine exercise.  I did send over the 2.5 for the 1 month then 5 mg for 1 month and then 7.5 more milligrams for another month he does get a little bit of  a metallic taste in his mouth with the medication.  If we need to stay at a dose for a little longer he can let me know.       Relevant Medications   tirzepatide (MOUNJARO) 5 MG/0.5ML Pen (Start on 08/15/2023)   tirzepatide [facility]) 2.5 MG/0.5ML Pen   tirzepatide (MOUNJARO) 7.5 MG/0.5ML Pen (Start on 09/05/2023)   Other Relevant Orders   POCT HgB A1C (Completed)   CMP14+EGFR   Other Visit Diagnoses       Skin lesion of face       Relevant Orders   Ambulatory referral to Dermatology        Return in about 3 months (around 10/25/2023) for Diabetes follow-up.    Duaine German, MD

## 2023-07-26 ENCOUNTER — Ambulatory Visit: Payer: Self-pay | Admitting: Family Medicine

## 2023-07-26 LAB — CMP14+EGFR
ALT: 32 IU/L (ref 0–44)
AST: 19 IU/L (ref 0–40)
Albumin: 4.4 g/dL (ref 3.8–4.9)
Alkaline Phosphatase: 131 IU/L — ABNORMAL HIGH (ref 44–121)
BUN/Creatinine Ratio: 13 (ref 9–20)
BUN: 14 mg/dL (ref 6–24)
Bilirubin Total: 0.7 mg/dL (ref 0.0–1.2)
CO2: 21 mmol/L (ref 20–29)
Calcium: 9.2 mg/dL (ref 8.7–10.2)
Chloride: 101 mmol/L (ref 96–106)
Creatinine, Ser: 1.09 mg/dL (ref 0.76–1.27)
Globulin, Total: 2.2 g/dL (ref 1.5–4.5)
Glucose: 306 mg/dL — ABNORMAL HIGH (ref 70–99)
Potassium: 4.2 mmol/L (ref 3.5–5.2)
Sodium: 139 mmol/L (ref 134–144)
Total Protein: 6.6 g/dL (ref 6.0–8.5)
eGFR: 79 mL/min/{1.73_m2} (ref >59)

## 2023-07-26 NOTE — Progress Notes (Signed)
 [name], your glucose was greater than 300 we absolutely need to get that under control.  Try to increase your water  intake and definitely cut down on sugars and carbs as you are starting the new medication let me know if you have any issues getting it at the pharmacy but I did send everything in for the next 3 strengths for the next 3 months.  Your liver enzyme called alkaline phosphatase is also elevated this might be because your sugars are not well-controlled right now so would like to recheck the alkaline phosphatase in about 3 weeks to make sure that it is improving.

## 2023-07-31 DIAGNOSIS — E1159 Type 2 diabetes mellitus with other circulatory complications: Secondary | ICD-10-CM | POA: Diagnosis not present

## 2023-07-31 DIAGNOSIS — Z1331 Encounter for screening for depression: Secondary | ICD-10-CM | POA: Diagnosis not present

## 2023-07-31 DIAGNOSIS — E1169 Type 2 diabetes mellitus with other specified complication: Secondary | ICD-10-CM | POA: Diagnosis not present

## 2023-07-31 DIAGNOSIS — E1165 Type 2 diabetes mellitus with hyperglycemia: Secondary | ICD-10-CM | POA: Diagnosis not present

## 2023-07-31 DIAGNOSIS — E1121 Type 2 diabetes mellitus with diabetic nephropathy: Secondary | ICD-10-CM | POA: Diagnosis not present

## 2023-08-26 DIAGNOSIS — L570 Actinic keratosis: Secondary | ICD-10-CM | POA: Diagnosis not present

## 2023-08-26 DIAGNOSIS — L739 Follicular disorder, unspecified: Secondary | ICD-10-CM | POA: Diagnosis not present

## 2023-08-26 DIAGNOSIS — L821 Other seborrheic keratosis: Secondary | ICD-10-CM | POA: Diagnosis not present

## 2023-08-30 DIAGNOSIS — G4733 Obstructive sleep apnea (adult) (pediatric): Secondary | ICD-10-CM | POA: Diagnosis not present

## 2023-08-30 DIAGNOSIS — Z6831 Body mass index (BMI) 31.0-31.9, adult: Secondary | ICD-10-CM | POA: Diagnosis not present

## 2023-09-20 ENCOUNTER — Other Ambulatory Visit: Payer: Self-pay | Admitting: Family Medicine

## 2023-09-20 DIAGNOSIS — E119 Type 2 diabetes mellitus without complications: Secondary | ICD-10-CM

## 2023-09-23 ENCOUNTER — Encounter: Payer: Self-pay | Admitting: Family Medicine

## 2023-09-24 NOTE — Telephone Encounter (Signed)
 Called  pharmacy and was told that the 7.5mg  is ready for pick up . Pharmacy states it might have just been too early to fill the medication at the time of the call.  Called the patient and informed of this. He will now reach out to the pharmacy.

## 2023-09-24 NOTE — Telephone Encounter (Signed)
 I think we just can have to call the pharmacy to clarify why they cannot dispense the 7.5.  It was sent in June as a taper.

## 2023-10-19 ENCOUNTER — Other Ambulatory Visit: Payer: Self-pay | Admitting: Family Medicine

## 2023-10-19 DIAGNOSIS — E119 Type 2 diabetes mellitus without complications: Secondary | ICD-10-CM

## 2023-10-20 ENCOUNTER — Other Ambulatory Visit: Payer: Self-pay | Admitting: Family Medicine

## 2023-10-20 DIAGNOSIS — I10 Essential (primary) hypertension: Secondary | ICD-10-CM

## 2023-10-24 ENCOUNTER — Ambulatory Visit: Admitting: Family Medicine

## 2023-10-24 ENCOUNTER — Ambulatory Visit

## 2023-10-24 ENCOUNTER — Encounter: Payer: Self-pay | Admitting: Family Medicine

## 2023-10-24 VITALS — BP 101/68 | HR 90 | Ht 71.0 in | Wt 228.1 lb

## 2023-10-24 DIAGNOSIS — R1032 Left lower quadrant pain: Secondary | ICD-10-CM | POA: Diagnosis not present

## 2023-10-24 DIAGNOSIS — M79661 Pain in right lower leg: Secondary | ICD-10-CM | POA: Diagnosis not present

## 2023-10-24 DIAGNOSIS — Z7984 Long term (current) use of oral hypoglycemic drugs: Secondary | ICD-10-CM

## 2023-10-24 DIAGNOSIS — M79662 Pain in left lower leg: Secondary | ICD-10-CM

## 2023-10-24 DIAGNOSIS — M25532 Pain in left wrist: Secondary | ICD-10-CM

## 2023-10-24 DIAGNOSIS — E119 Type 2 diabetes mellitus without complications: Secondary | ICD-10-CM | POA: Diagnosis not present

## 2023-10-24 LAB — POCT GLYCOSYLATED HEMOGLOBIN (HGB A1C): Hemoglobin A1C: 7.9 % — AB (ref 4.0–5.6)

## 2023-10-24 MED ORDER — MOUNJARO 7.5 MG/0.5ML ~~LOC~~ SOAJ: 7.5000 mg | SUBCUTANEOUS | 1 refills | Status: DC

## 2023-10-24 NOTE — Progress Notes (Signed)
 Established Patient Office Visit  Subjective  Patient ID: Nicholas Christian, male    DOB: 09-13-1965  Age: 58 y.o. MRN: [id number]  Chief Complaint  Patient presents with   Diabetes    HPI  Discussed the use of AI scribe software for clinical note transcription with the patient, who gave verbal consent to proceed.  History of Present Illness Dayvon Dax is a 58 year old male with type 2 diabetes who presents for management of his diabetes medications and symptoms related to tirzepatide  use.  Gastrointestinal symptoms related to tirzepatide  - Nausea occurs typically on the second and third day post-administration, but this week persisted throughout the week - Nausea managed with Pepto Bismol - Hunger after 6 PM worsens nausea - Abdominal discomfort, particularly in the left lower quadrant, associated with tirzepatide  use - Tenderness in the left lower quadrant - No cramping - Discomfort described as mild  Diabetes mellitus management - Completed four weeks of tirzepatide  at 7.5 mg - Considering increasing tirzepatide  dose to 10 mg - Hemoglobin A1c improved to 7.9  Musculoskeletal injuries - Fall two months ago resulting in wrist pain and weakness - Ongoing wrist pain with limited range of motion - Suspects possible wrist fracture, no imaging performed - Toe pain following the fall, suspects fracture, no treatment sought  Lower extremity pain and fatigue - Leg pain and fatigue during exercise, requiring rest before continuing - History of varicose veins with soreness in the area - Vascular screening completed, results not available  Physical activity and nutrition - Exercise routine varies, with periods of high activity (e.g., walking extensively in [place]) and periods of less activity - Plans to increase exercise as weather cools and more free time is available - Mindful of maintaining protein intake to prevent muscle loss while managing  weight      ROS    Objective:     BP 101/68   Pulse 90   Ht 5' 11 (1.803 m)   Wt 228 lb 1.3 oz (103.5 kg)   SpO2 95%   BMI 31.81 kg/m    Physical Exam Vitals and nursing note reviewed.  Constitutional:      Appearance: Normal appearance.  HENT:     Head: Normocephalic and atraumatic.  Eyes:     Conjunctiva/sclera: Conjunctivae normal.  Cardiovascular:     Rate and Rhythm: Normal rate and regular rhythm.  Pulmonary:     Effort: Pulmonary effort is normal.     Breath sounds: Normal breath sounds.  Musculoskeletal:     Comments: Tender over distal great toe joint.  Wrist pain is lateral.   Skin:    General: Skin is warm and dry.  Neurological:     Mental Status: He is alert.  Psychiatric:        Mood and Affect: Mood normal.      Results for orders placed or performed in visit on 10/24/23  POCT HgB A1C  Result Value Ref Range   Hemoglobin A1C 7.9 (A) 4.0 - 5.6 %   HbA1c POC (<> result, manual entry)     HbA1c, POC (prediabetic range)     HbA1c, POC (controlled diabetic range)        The 10-year ASCVD risk score (Arnett DK, et al., 2019) is: 13%    Assessment & Plan:   Problem List Items Addressed This Visit       Endocrine   Controlled type 2 diabetes mellitus without complication, without long-term current use of insulin  (  HCC) - Primary   Relevant Medications   tirzepatide  (MOUNJARO ) 7.5 MG/0.5ML Pen   Other Relevant Orders   POCT HgB A1C (Completed)   CMP14+EGFR   Lipase   Other Visit Diagnoses       LLQ pain       Relevant Orders   CMP14+EGFR   Lipase     Left wrist pain       Relevant Orders   DG Wrist Complete Left     Pain in both lower legs       Relevant Orders   VAS US  ABI WITH/WO TBI       Assessment and Plan Assessment & Plan Type 2 diabetes mellitus on GLP-1 agonist therapy with medication-induced nausea and left lower quadrant abdominal discomfort Type 2 diabetes with improved A1c at 7.9. Nausea and abdominal  discomfort likely due to GLP-1 agonist. Discomfort consistent with colonic fullness, not pancreatitis. - Restart metformin  to reduce A1c to target range in the 6s. - Continue tirzepatide  at 7.5 mg with a 30-day supply and a refill. Consider increasing to 10 mg if tolerated after 60 days. - Advise small, frequent meals for nausea management. - Encourage Pepto Bismol use for nausea if effective. - Check lipase levels to rule out pancreatitis.  Claudication symptoms, possible peripheral arterial disease Intermittent claudication with leg pain during walking, relieved by rest. Possible peripheral arterial disease suggested by prior abnormal screening. - Order ankle-brachial index (ABI) test at a vascular lab to evaluate for peripheral arterial disease.  Left wrist pain after fall, possible fracture Left wrist pain persisting for two months post-fall. Possible fracture or soft tissue injury. - Order left wrist x-ray to evaluate for fracture.  Toe pain after trauma Toe pain post-trauma with possible fracture. Pain localized to great toe distal joint, but toe flexion is good. - Advise buddy taping the toe with gauze in between if pain persists or worsens.  LLQ pain -  - will check lipase  Return in about 3 months (around 01/23/2024) for Diabetes follow-up.    Dorothyann Byars, MD

## 2023-10-25 ENCOUNTER — Ambulatory Visit: Payer: Self-pay | Admitting: Family Medicine

## 2023-10-25 LAB — CMP14+EGFR
ALT: 43 IU/L (ref 0–44)
AST: 28 IU/L (ref 0–40)
Albumin: 4.4 g/dL (ref 3.8–4.9)
Alkaline Phosphatase: 92 IU/L (ref 44–121)
BUN/Creatinine Ratio: 13 (ref 9–20)
BUN: 15 mg/dL (ref 6–24)
Bilirubin Total: 1 mg/dL (ref 0.0–1.2)
CO2: 24 mmol/L (ref 20–29)
Calcium: 8.9 mg/dL (ref 8.7–10.2)
Chloride: 103 mmol/L (ref 96–106)
Creatinine, Ser: 1.17 mg/dL (ref 0.76–1.27)
Globulin, Total: 2.1 g/dL (ref 1.5–4.5)
Glucose: 159 mg/dL — ABNORMAL HIGH (ref 70–99)
Potassium: 4 mmol/L (ref 3.5–5.2)
Sodium: 140 mmol/L (ref 134–144)
Total Protein: 6.5 g/dL (ref 6.0–8.5)
eGFR: 73 mL/min/1.73 (ref >59)

## 2023-10-25 LAB — LIPASE: Lipase: 67 U/L (ref 13–78)

## 2023-10-25 NOTE — Progress Notes (Signed)
 Hi Shawn, your alk phos os back to normal which is great. Lipase is also normal. No sign of pancreatitis.

## 2023-10-25 NOTE — Progress Notes (Signed)
 Hi Shawn,  X-ray of the wrist looks okay.  Probably a little bit more of a ligament strain.  You could consider wearing just a little wrist brace that she can get over-the-counter for little extra support if you want.  Continue to ice the area as needed if it still not improving over the next 2 to 3 weeks let us  know but again no sign of fracture which is great.

## 2023-10-29 ENCOUNTER — Ambulatory Visit (HOSPITAL_COMMUNITY)
Admission: RE | Admit: 2023-10-29 | Discharge: 2023-10-29 | Disposition: A | Discharge status: Home / Self Care | Source: Ambulatory Visit | Attending: Family Medicine | Admitting: Family Medicine

## 2023-10-29 DIAGNOSIS — M79661 Pain in right lower leg: Secondary | ICD-10-CM | POA: Diagnosis not present

## 2023-10-29 DIAGNOSIS — M79662 Pain in left lower leg: Secondary | ICD-10-CM | POA: Insufficient documentation

## 2023-10-30 LAB — VAS US ABI WITH/WO TBI
Left ABI: 1.38
Right ABI: 1.37

## 2023-10-30 NOTE — Progress Notes (Signed)
 Call patient and let him know that the Doppler of his lower extremities looks normal.  He has got good blood flow through the arteries all the way down to his toes on both feet.  So no sign of peripheral vascular disease.  Next up would be to consider referral for your varicose veins which could be causing some of the leg fatigue and discomfort.  If that something you are interested in we can get you into the vein restoration center for further workup and evaluation.

## 2023-12-09 DIAGNOSIS — D225 Melanocytic nevi of trunk: Secondary | ICD-10-CM | POA: Diagnosis not present

## 2023-12-09 DIAGNOSIS — D485 Neoplasm of uncertain behavior of skin: Secondary | ICD-10-CM | POA: Diagnosis not present

## 2023-12-12 DIAGNOSIS — E119 Type 2 diabetes mellitus without complications: Secondary | ICD-10-CM | POA: Diagnosis not present

## 2023-12-12 DIAGNOSIS — R1310 Dysphagia, unspecified: Secondary | ICD-10-CM | POA: Diagnosis not present

## 2023-12-12 DIAGNOSIS — K219 Gastro-esophageal reflux disease without esophagitis: Secondary | ICD-10-CM | POA: Diagnosis not present

## 2023-12-12 DIAGNOSIS — K5900 Constipation, unspecified: Secondary | ICD-10-CM | POA: Diagnosis not present

## 2023-12-12 DIAGNOSIS — K200 Eosinophilic esophagitis: Secondary | ICD-10-CM | POA: Diagnosis not present

## 2023-12-23 ENCOUNTER — Encounter: Payer: Self-pay | Admitting: Family Medicine

## 2023-12-24 MED ORDER — TIRZEPATIDE 10 MG/0.5ML ~~LOC~~ SOAJ: 10.0000 mg | SUBCUTANEOUS | 0 refills | Status: DC

## 2023-12-24 NOTE — Telephone Encounter (Signed)
 Forwarding message to [place] , [place] covering Dr. Alvan Patient requesting rx rf of Mounjaro  with strength increase  Last written as 7.5mg   10/24/2023 Last OV 10/24/2023 Upcoming appt 12/11/225  Pended Mounjaro  10mg  to fill if appropriate

## 2024-01-08 DIAGNOSIS — R1310 Dysphagia, unspecified: Secondary | ICD-10-CM | POA: Diagnosis not present

## 2024-01-08 DIAGNOSIS — K2289 Other specified disease of esophagus: Secondary | ICD-10-CM | POA: Diagnosis not present

## 2024-01-08 DIAGNOSIS — K200 Eosinophilic esophagitis: Secondary | ICD-10-CM | POA: Diagnosis not present

## 2024-01-08 DIAGNOSIS — K222 Esophageal obstruction: Secondary | ICD-10-CM | POA: Diagnosis not present

## 2024-01-19 ENCOUNTER — Other Ambulatory Visit: Payer: Self-pay | Admitting: Family Medicine

## 2024-01-19 DIAGNOSIS — I2510 Atherosclerotic heart disease of native coronary artery without angina pectoris: Secondary | ICD-10-CM

## 2024-01-19 DIAGNOSIS — E1169 Type 2 diabetes mellitus with other specified complication: Secondary | ICD-10-CM

## 2024-01-21 ENCOUNTER — Encounter: Payer: Self-pay | Admitting: Family Medicine

## 2024-01-23 ENCOUNTER — Encounter: Payer: Self-pay | Admitting: Family Medicine

## 2024-01-23 ENCOUNTER — Ambulatory Visit: Admitting: Family Medicine

## 2024-01-23 VITALS — BP 115/72 | HR 87 | Ht 71.0 in | Wt 221.0 lb

## 2024-01-23 DIAGNOSIS — E119 Type 2 diabetes mellitus without complications: Secondary | ICD-10-CM | POA: Diagnosis not present

## 2024-01-23 DIAGNOSIS — E1169 Type 2 diabetes mellitus with other specified complication: Secondary | ICD-10-CM | POA: Diagnosis not present

## 2024-01-23 DIAGNOSIS — E785 Hyperlipidemia, unspecified: Secondary | ICD-10-CM

## 2024-01-23 DIAGNOSIS — Z23 Encounter for immunization: Secondary | ICD-10-CM | POA: Diagnosis not present

## 2024-01-23 DIAGNOSIS — I10 Essential (primary) hypertension: Secondary | ICD-10-CM

## 2024-01-23 DIAGNOSIS — Z7984 Long term (current) use of oral hypoglycemic drugs: Secondary | ICD-10-CM

## 2024-01-23 DIAGNOSIS — Z125 Encounter for screening for malignant neoplasm of prostate: Secondary | ICD-10-CM

## 2024-01-23 DIAGNOSIS — R1012 Left upper quadrant pain: Secondary | ICD-10-CM

## 2024-01-23 DIAGNOSIS — Z1159 Encounter for screening for other viral diseases: Secondary | ICD-10-CM | POA: Diagnosis not present

## 2024-01-23 LAB — POCT GLYCOSYLATED HEMOGLOBIN (HGB A1C): Hemoglobin A1C: 6.3 % — AB (ref 4.0–5.6)

## 2024-01-23 NOTE — Progress Notes (Signed)
 Established Patient Office Visit  Patient ID: Nicholas Christian, male    DOB: 02/20/1965  Age: 58 y.o. MRN: [id number] PCP: Alvan Dorothyann BIRCH, MD  Chief Complaint  Patient presents with   Diabetes    Subjective:     HPI  Discussed the use of AI scribe software for clinical note transcription with the patient, who gave verbal consent to proceed.  History of Present Illness Nicholas Christian is a 58 year old male with diabetes who presents with concerns about medication administration and side effects.  Diabetes management and medication administration - Diabetes managed with metformin , one tablet daily, and injectable medication (GLP-1 agonist). - A1c currently 6.3%. - On Monday, took a double dose of injectable diabetes medication due to concern about incomplete administration; did not sense usual dispersion of medicine. - Experienced frequent urination Monday night, which is atypical after medication administration. - On Tuesday, took an additional dose due to concern about missed dose, resulting in panic about possible overdose. - Queasiness and lightheadedness occurred after double dosing. - Has been on current regimen for over six months.  Abdominal pain - Persistent left upper quadrant pain with slight radiation, ongoing for six months. - Pain is similar to symptoms experienced by another patient using GLP-1 medications. - Previous workup included blood tests for pancreatic function. - no prior spleen issues.   Lifestyle modifications - Increased physical activity, averaging 7,000 steps per day, attributed to new job at [employer] with more physical demands compared to previous employment at [employer]. - Challenges maintaining healthy diet, especially during holiday season. - Making efforts to prioritize protein and vegetables over carbohydrates.  Preventive care and monitoring - Received pneumonia vaccine; has not yet received flu vaccine. - Completed  foot examination during current visit. - Due for urine sample to monitor for proteinuria as part of routine kidney surveillance.     ROS    Objective:     BP 115/72   Pulse 87   Ht 5' 11 (1.803 m)   Wt 221 lb (100.2 kg)   SpO2 96%   BMI 30.82 kg/m    Physical Exam Vitals and nursing note reviewed.  Constitutional:      Appearance: Normal appearance.  HENT:     Head: Normocephalic and atraumatic.  Eyes:     Conjunctiva/sclera: Conjunctivae normal.  Cardiovascular:     Rate and Rhythm: Normal rate and regular rhythm.  Pulmonary:     Effort: Pulmonary effort is normal.     Breath sounds: Normal breath sounds.  Skin:    General: Skin is warm and dry.  Neurological:     Mental Status: He is alert.  Psychiatric:        Mood and Affect: Mood normal.      Results for orders placed or performed in visit on 01/23/24  POCT HgB A1C  Result Value Ref Range   Hemoglobin A1C 6.3 (A) 4.0 - 5.6 %   HbA1c POC (<> result, manual entry)     HbA1c, POC (prediabetic range)     HbA1c, POC (controlled diabetic range)        The 10-year ASCVD risk score (Arnett DK, et al., 2019) is: 16.1%    Assessment & Plan:   Problem List Items Addressed This Visit       Cardiovascular and Mediastinum   Essential hypertension, benign   Well controlled. Continue current regimen. Follow up in  56mo       Relevant Orders   POCT  HgB A1C (Completed)   Urine Microalbumin w/creat. ratio   Hepatitis C Antibody   Lipid Panel With LDL/HDL Ratio   CMP14+EGFR   PSA     Endocrine   Controlled type 2 diabetes mellitus without complication, without long-term current use of insulin  (HCC)   Type 2 diabetes mellitus A1c at 6.3%. Queasiness and lightheadedness post-GLP-1 injections, possibly hypoglycemia. 9-pound weight loss. Current regimen: metformin  and GLP-1 injections. - Consider doubling metformin  if GLP-1 efficacy is uncertain. - Encouraged dietary modifications: protein, vegetables,  low carbohydrates. - Encouraged regular exercise: walking, resistance training. - Obtained urine sample for protein loss.      Relevant Orders   POCT HgB A1C (Completed)   Urine Microalbumin w/creat. ratio   Hepatitis C Antibody   Lipid Panel With LDL/HDL Ratio   CMP14+EGFR   PSA   Other Visit Diagnoses       LUQ pain    -  Primary   Relevant Orders   CT ABDOMEN PELVIS W CONTRAST     Hyperlipidemia associated with type 2 diabetes mellitus (HCC)       Relevant Orders   POCT HgB A1C (Completed)   Urine Microalbumin w/creat. ratio   Hepatitis C Antibody   Lipid Panel With LDL/HDL Ratio   CMP14+EGFR   PSA     Need for hepatitis C screening test       Relevant Orders   POCT HgB A1C (Completed)   Hepatitis C Antibody     Encounter for immunization       Relevant Orders   Pneumococcal conjugate vaccine 20-valent (Completed)     Screening for malignant neoplasm of prostate       Relevant Orders   PSA       Assessment and Plan Assessment & Plan  Left upper quadrant abdominal pain Chronic pain over six months, possibly related to GLP-1 injections. Differential includes medication side effects or other pathology. - Ordered CT scan of abdomen.  General health maintenance Discussed vaccinations and screenings. Pneumonia vaccine given. Foot test completed. - Encouraged resistance training twice weekly. - Encouraged adequate hydration.   Return in about 4 months (around 05/23/2024) for DM.    Dorothyann Byars, MD [facility] at [facility]

## 2024-01-23 NOTE — Assessment & Plan Note (Signed)
 Type 2 diabetes mellitus A1c at 6.3%. Queasiness and lightheadedness post-GLP-1 injections, possibly hypoglycemia. 9-pound weight loss. Current regimen: metformin  and GLP-1 injections. - Consider doubling metformin  if GLP-1 efficacy is uncertain. - Encouraged dietary modifications: protein, vegetables, low carbohydrates. - Encouraged regular exercise: walking, resistance training. - Obtained urine sample for protein loss.

## 2024-01-23 NOTE — Assessment & Plan Note (Signed)
 Well controlled. Continue current regimen. Follow up in  6 mo

## 2024-01-24 ENCOUNTER — Ambulatory Visit: Payer: Self-pay | Admitting: Family Medicine

## 2024-01-24 LAB — CMP14+EGFR
ALT: 30 IU/L (ref 0–44)
AST: 29 IU/L (ref 0–40)
Albumin: 4.7 g/dL (ref 3.8–4.9)
Alkaline Phosphatase: 76 IU/L (ref 47–123)
BUN/Creatinine Ratio: 17 (ref 9–20)
BUN: 20 mg/dL (ref 6–24)
Bilirubin Total: 1.4 mg/dL — ABNORMAL HIGH (ref 0.0–1.2)
CO2: 24 mmol/L (ref 20–29)
Calcium: 10.1 mg/dL (ref 8.7–10.2)
Chloride: 103 mmol/L (ref 96–106)
Creatinine, Ser: 1.21 mg/dL (ref 0.76–1.27)
Globulin, Total: 2 g/dL (ref 1.5–4.5)
Glucose: 127 mg/dL — ABNORMAL HIGH (ref 70–99)
Potassium: 3.7 mmol/L (ref 3.5–5.2)
Sodium: 142 mmol/L (ref 134–144)
Total Protein: 6.7 g/dL (ref 6.0–8.5)
eGFR: 70 mL/min/1.73 (ref >59)

## 2024-01-24 LAB — LIPID PANEL WITH LDL/HDL RATIO
Cholesterol, Total: 120 mg/dL (ref 100–199)
HDL: 33 mg/dL — ABNORMAL LOW (ref >39)
LDL Chol Calc (NIH): 60 mg/dL (ref 0–99)
LDL/HDL Ratio: 1.8 ratio (ref 0.0–3.6)
Triglycerides: 156 mg/dL — ABNORMAL HIGH (ref 0–149)
VLDL Cholesterol Cal: 27 mg/dL (ref 5–40)

## 2024-01-24 LAB — SPECIMEN STATUS REPORT

## 2024-01-24 LAB — HEPATITIS C ANTIBODY: Hep C Virus Ab: NONREACTIVE

## 2024-01-24 LAB — MICROALBUMIN / CREATININE URINE RATIO
Creatinine, Urine: 175.5 mg/dL
Microalb/Creat Ratio: 4 mg/g{creat} (ref 0–29)
Microalbumin, Urine: 6.4 ug/mL

## 2024-01-24 LAB — PSA: Prostate Specific Ag, Serum: 2.7 ng/mL (ref 0.0–4.0)

## 2024-01-24 NOTE — Progress Notes (Signed)
 Hi Shawn, triglycerides and LDL look much better, fantastic!  Metabolic panel looks good.  Negative for hepatitis C.  We usually just check once in your lifetime.  Prostate test and urine sample still pending.

## 2024-01-24 NOTE — Progress Notes (Signed)
 Prostate test is normal. Urine sample is good

## 2024-01-27 ENCOUNTER — Ambulatory Visit: Payer: Self-pay | Admitting: Family Medicine

## 2024-01-28 LAB — OPHTHALMOLOGY REPORT-SCANNED

## 2024-01-29 ENCOUNTER — Other Ambulatory Visit

## 2024-01-29 DIAGNOSIS — K439 Ventral hernia without obstruction or gangrene: Secondary | ICD-10-CM | POA: Diagnosis not present

## 2024-01-29 DIAGNOSIS — R1012 Left upper quadrant pain: Secondary | ICD-10-CM | POA: Diagnosis not present

## 2024-01-29 DIAGNOSIS — K8020 Calculus of gallbladder without cholecystitis without obstruction: Secondary | ICD-10-CM | POA: Diagnosis not present

## 2024-01-29 DIAGNOSIS — K4020 Bilateral inguinal hernia, without obstruction or gangrene, not specified as recurrent: Secondary | ICD-10-CM | POA: Diagnosis not present

## 2024-01-29 DIAGNOSIS — K5730 Diverticulosis of large intestine without perforation or abscess without bleeding: Secondary | ICD-10-CM | POA: Diagnosis not present

## 2024-01-29 MED ORDER — IOHEXOL 300 MG/ML  SOLN
100.0000 mL | Freq: Once | INTRAMUSCULAR | Status: AC | PRN
  Administered 2024-01-29: 16:00:00 100 mL via INTRAVENOUS

## 2024-02-03 NOTE — Progress Notes (Signed)
 HI Peggy,   CT of the abdomen and pelvis shows no abnormalities.  A little bit of thickening in the wall of the esophagus which can be an indication of a little bit of inflammation called esophagitis.  They did see some increased fat in the liver called hepatic steatosis.  The main treatment for this is improving your diet with more Mediterranean style diet and regular exercise.  You do have some gallstones but no sign of acute cholecystitis.  You also have some kidney stones in the left but they are not causing a problem or any blockage right now.  You do have inguinal hernias on the right and left side and a small hernia above your bellybutton.  Unfortunately nothing specific to actually cause the pain in the left upper area of the abdomen.

## 2024-03-17 ENCOUNTER — Other Ambulatory Visit: Payer: Self-pay | Admitting: Physician Assistant

## 2024-05-26 ENCOUNTER — Ambulatory Visit: Admitting: Family Medicine
